# Patient Record
Sex: Female | Born: 1950 | Race: White | Hispanic: No | Marital: Married
Health system: Midwestern US, Community
[De-identification: ages and names within clinical notes are randomized; demographics above are authoritative.]

## PROBLEM LIST (undated history)

## (undated) DIAGNOSIS — M1612 Unilateral primary osteoarthritis, left hip: Secondary | ICD-10-CM

## (undated) DIAGNOSIS — M5416 Radiculopathy, lumbar region: Secondary | ICD-10-CM

## (undated) DIAGNOSIS — 1 ERRONEOUS ENCOUNTER ICD10: Secondary | ICD-10-CM

## (undated) DIAGNOSIS — E038 Other specified hypothyroidism: Secondary | ICD-10-CM

## (undated) DIAGNOSIS — M79604 Pain in right leg: Secondary | ICD-10-CM

## (undated) DIAGNOSIS — M545 Low back pain, unspecified: Secondary | ICD-10-CM

## (undated) DIAGNOSIS — Z1231 Encounter for screening mammogram for malignant neoplasm of breast: Secondary | ICD-10-CM

## (undated) DIAGNOSIS — M5117 Intervertebral disc disorders with radiculopathy, lumbosacral region: Secondary | ICD-10-CM

## (undated) DIAGNOSIS — R928 Other abnormal and inconclusive findings on diagnostic imaging of breast: Secondary | ICD-10-CM

## (undated) DIAGNOSIS — F17210 Nicotine dependence, cigarettes, uncomplicated: Secondary | ICD-10-CM

## (undated) DIAGNOSIS — M25571 Pain in right ankle and joints of right foot: Secondary | ICD-10-CM

## (undated) DIAGNOSIS — Z96642 Presence of left artificial hip joint: Secondary | ICD-10-CM

## (undated) DIAGNOSIS — Z0000 Encounter for general adult medical examination without abnormal findings: Principal | ICD-10-CM

## (undated) DIAGNOSIS — R7303 Prediabetes: Secondary | ICD-10-CM

## (undated) DIAGNOSIS — G8929 Other chronic pain: Secondary | ICD-10-CM

## (undated) DIAGNOSIS — M25561 Pain in right knee: Secondary | ICD-10-CM

## (undated) DIAGNOSIS — Z01818 Encounter for other preprocedural examination: Secondary | ICD-10-CM

## (undated) DIAGNOSIS — Z4789 Encounter for other orthopedic aftercare: Secondary | ICD-10-CM

## (undated) DIAGNOSIS — Z789 Other specified health status: Secondary | ICD-10-CM

## (undated) DIAGNOSIS — N393 Stress incontinence (female) (male): Secondary | ICD-10-CM

## (undated) DIAGNOSIS — E559 Vitamin D deficiency, unspecified: Secondary | ICD-10-CM

## (undated) DIAGNOSIS — I10 Essential (primary) hypertension: Secondary | ICD-10-CM

## (undated) DIAGNOSIS — F17200 Nicotine dependence, unspecified, uncomplicated: Secondary | ICD-10-CM

## (undated) DIAGNOSIS — S99101A Unspecified physeal fracture of right metatarsal, initial encounter for closed fracture: Secondary | ICD-10-CM

## (undated) DIAGNOSIS — M25559 Pain in unspecified hip: Secondary | ICD-10-CM

## (undated) DIAGNOSIS — E039 Hypothyroidism, unspecified: Secondary | ICD-10-CM

## (undated) DIAGNOSIS — S82831D Other fracture of upper and lower end of right fibula, subsequent encounter for closed fracture with routine healing: Secondary | ICD-10-CM

## (undated) DIAGNOSIS — S82831A Other fracture of upper and lower end of right fibula, initial encounter for closed fracture: Secondary | ICD-10-CM

## (undated) DIAGNOSIS — M167 Other unilateral secondary osteoarthritis of hip: Secondary | ICD-10-CM

## (undated) DIAGNOSIS — Z0189 Encounter for other specified special examinations: Secondary | ICD-10-CM

## (undated) DIAGNOSIS — M25552 Pain in left hip: Secondary | ICD-10-CM

## (undated) HISTORY — DX: Stress incontinence (female) (male): N39.3

## (undated) HISTORY — PX: TONSILLECTOMY: SUR1361

## (undated) HISTORY — DX: Other specified health status: Z78.9

## (undated) HISTORY — DX: Nicotine dependence, unspecified, uncomplicated: F17.200

## (undated) HISTORY — DX: Vitamin D deficiency, unspecified: E55.9

## (undated) HISTORY — PX: BACK SURGERY: SHX140

## (undated) HISTORY — PX: ABDOMINAL HYSTERECTOMY: SHX81

## (undated) HISTORY — PX: CHOLECYSTECTOMY: SHX55

---

## 2003-02-01 ENCOUNTER — Other Ambulatory Visit: Admission: RE | Admit: 2003-02-01 | Discharge: 2003-02-01 | Payer: Self-pay | Admitting: Obstetrics and Gynecology

## 2010-06-09 ENCOUNTER — Encounter: Admission: RE | Admit: 2010-06-09 | Discharge: 2010-06-09 | Payer: Self-pay | Admitting: Family Medicine

## 2011-08-13 ENCOUNTER — Other Ambulatory Visit: Payer: Self-pay

## 2011-08-13 ENCOUNTER — Encounter (HOSPITAL_COMMUNITY)
Admission: RE | Admit: 2011-08-13 | Discharge: 2011-08-13 | Disposition: A | Discharge status: Home / Self Care | Payer: PRIVATE HEALTH INSURANCE | Source: Ambulatory Visit | Attending: Obstetrics & Gynecology | Admitting: Obstetrics & Gynecology

## 2011-08-13 ENCOUNTER — Encounter (HOSPITAL_COMMUNITY): Payer: Self-pay

## 2011-08-13 HISTORY — DX: Essential (primary) hypertension: I10

## 2011-08-13 LAB — BASIC METABOLIC PANEL
Calcium: 9.2 mg/dL (ref 8.4–10.5)
Chloride: 101 mEq/L (ref 96–112)
Creatinine, Ser: 0.55 mg/dL (ref 0.50–1.10)
Glucose, Bld: 93 mg/dL (ref 70–99)
Potassium: 4.2 mEq/L (ref 3.5–5.1)

## 2011-08-13 LAB — CBC
HCT: 45.1 % (ref 36.0–46.0)
Hemoglobin: 15.6 g/dL — ABNORMAL HIGH (ref 12.0–15.0)
MCH: 32.5 pg (ref 26.0–34.0)
MCV: 94 fL (ref 78.0–100.0)
Platelets: 288 10*3/uL (ref 150–400)
RBC: 4.8 MIL/uL (ref 3.87–5.11)
WBC: 7.6 10*3/uL (ref 4.0–10.5)

## 2011-08-13 LAB — SURGICAL PCR SCREEN: MRSA, PCR: NEGATIVE

## 2011-08-13 NOTE — Anesthesia Preprocedure Evaluation (Addendum)
Anesthesia Evaluation  Name, MR# and DOB Patient awake  General Assessment Comment  Reviewed: Allergy & Precautions, H&P , Patient's Chart, lab work & pertinent test results, reviewed documented beta blocker date and time , Unable to perform ROS - Chart review only  Airway Mallampati: I TM Distance: >3 FB Neck ROM: full    Dental  (+) Teeth Intact and Dental Advisory Given   Pulmonary  clear to auscultation  pulmonary exam normalPulmonary Exam Normal breath sounds clear to auscultation none    Cardiovascular regular Bradycardia    Neuro/Psych Negative Neurological ROS  Negative Psych ROS  GI/Hepatic/Renal negative GI ROS, negative Liver ROS, and negative Renal ROS (+)       Endo/Other  Negative Endocrine ROS (+)      Abdominal Normal abdominal exam  (+)   Musculoskeletal   Hematology negative hematology ROS (+)   Peds  Reproductive/Obstetrics negative OB ROS    Anesthesia Other Findings           Anesthesia Physical Anesthesia Plan  ASA: II  Anesthesia Plan: General   Post-op Pain Management:    Induction: Intravenous  Airway Management Planned: LMA  Additional Equipment:   Intra-op Plan:   Post-operative Plan:   Informed Consent: I have reviewed the patients History and Physical, chart, labs and discussed the procedure including the risks, benefits and alternatives for the proposed anesthesia with the patient or authorized representative who has indicated his/her understanding and acceptance.   Dental Advisory Given and History available from chart only  Plan Discussed with: CRNA  Anesthesia Plan Comments: (Patient does not want Versed)      Anesthesia Quick Evaluation

## 2011-08-13 NOTE — Patient Instructions (Signed)
20 Andrea Floyd  08/13/2011   Your procedure is scheduled on:  08/20/11  Report to [facility] at 0600 AM.  Call this number if you have problems the morning of surgery: [phone]   Remember:   Do not eat food:After Midnight.  Do not drink clear liquids: After Midnight.  Take these medicines the morning of surgery with A SIP OF WATER: none   Do not wear jewelry, make-up or nail polish.  Do not wear lotions, powders, or perfumes. You may wear deodorant.  Do not shave 48 hours prior to surgery.  Do not bring valuables to the hospital.  Contacts, dentures or bridgework may not be worn into surgery.  Leave suitcase in the car. After surgery it may be brought to your room.  For patients admitted to the hospital, checkout time is 11:00 AM the day of discharge.   Patients discharged the day of surgery will not be allowed to drive home.  Name and phone number of your driver: [name]  Special Instructions: CHG Shower Use Special Wash: 1/2 bottle night before surgery and 1/2 bottle morning of surgery.   Please read over the following fact sheets that you were given: Care and Recovery After Surgery

## 2011-08-20 ENCOUNTER — Ambulatory Visit (HOSPITAL_COMMUNITY): Payer: PRIVATE HEALTH INSURANCE | Admitting: Anesthesiology

## 2011-08-20 ENCOUNTER — Other Ambulatory Visit: Payer: Self-pay | Admitting: Obstetrics & Gynecology

## 2011-08-20 ENCOUNTER — Inpatient Hospital Stay (HOSPITAL_COMMUNITY)
Admission: RE | Admit: 2011-08-20 | Discharge: 2011-08-21 | DRG: 743 | Disposition: A | Discharge status: Home / Self Care | Payer: PRIVATE HEALTH INSURANCE | Source: Ambulatory Visit | Attending: Obstetrics & Gynecology | Admitting: Obstetrics & Gynecology

## 2011-08-20 ENCOUNTER — Encounter (HOSPITAL_COMMUNITY): Payer: Self-pay | Admitting: Anesthesiology

## 2011-08-20 ENCOUNTER — Encounter (HOSPITAL_COMMUNITY)
Admission: RE | Disposition: A | Discharge status: Home / Self Care | Payer: Self-pay | Source: Ambulatory Visit | Attending: Obstetrics & Gynecology

## 2011-08-20 ENCOUNTER — Encounter (HOSPITAL_COMMUNITY): Payer: Self-pay | Admitting: Obstetrics & Gynecology

## 2011-08-20 DIAGNOSIS — E039 Hypothyroidism, unspecified: Secondary | ICD-10-CM | POA: Diagnosis present

## 2011-08-20 DIAGNOSIS — N8000 Endometriosis of the uterus, unspecified: Secondary | ICD-10-CM | POA: Diagnosis present

## 2011-08-20 DIAGNOSIS — N814 Uterovaginal prolapse, unspecified: Secondary | ICD-10-CM

## 2011-08-20 DIAGNOSIS — N393 Stress incontinence (female) (male): Secondary | ICD-10-CM | POA: Diagnosis present

## 2011-08-20 DIAGNOSIS — I10 Essential (primary) hypertension: Secondary | ICD-10-CM | POA: Diagnosis present

## 2011-08-20 DIAGNOSIS — N812 Incomplete uterovaginal prolapse: Principal | ICD-10-CM | POA: Diagnosis present

## 2011-08-20 HISTORY — PX: ANTERIOR AND POSTERIOR REPAIR: SHX5121

## 2011-08-20 HISTORY — DX: Stress incontinence (female) (male): N39.3

## 2011-08-20 HISTORY — PX: VAGINAL HYSTERECTOMY: SHX2639

## 2011-08-20 HISTORY — PX: BLADDER SUSPENSION: SHX72

## 2011-08-20 LAB — TYPE AND SCREEN: Antibody Screen: NEGATIVE

## 2011-08-20 LAB — ABO/RH: ABO/RH(D): A POS

## 2011-08-20 SURGERY — HYSTERECTOMY, VAGINAL
Anesthesia: General | Site: Vagina | Wound class: Clean Contaminated

## 2011-08-20 MED ORDER — ESTRADIOL 0.1 MG/GM VA CREA
TOPICAL_CREAM | VAGINAL | Status: DC | PRN
  Administered 2011-08-20: 1 via VAGINAL

## 2011-08-20 MED ORDER — HYDROMORPHONE HCL 1 MG/ML IJ SOLN: 0.2500 mg | INTRAMUSCULAR | Status: DC | PRN

## 2011-08-20 MED ORDER — ONDANSETRON HCL 4 MG PO TABS: 4.0000 mg | ORAL_TABLET | Freq: Four times a day (QID) | ORAL | Status: DC | PRN

## 2011-08-20 MED ORDER — HYDROMORPHONE HCL 1 MG/ML IJ SOLN
INTRAMUSCULAR | Status: AC
  Filled 2011-08-20: qty 1

## 2011-08-20 MED ORDER — SODIUM CHLORIDE 0.9 % IJ SOLN
INTRAMUSCULAR | Status: DC | PRN
  Administered 2011-08-20: 30 mL via INTRAVENOUS

## 2011-08-20 MED ORDER — VASOPRESSIN 20 UNIT/ML IJ SOLN
INTRAMUSCULAR | Status: DC | PRN
  Administered 2011-08-20: 20 [IU] via SUBCUTANEOUS

## 2011-08-20 MED ORDER — INDIGOTINDISULFONATE SODIUM 8 MG/ML IJ SOLN
INTRAMUSCULAR | Status: AC
  Filled 2011-08-20: qty 5

## 2011-08-20 MED ORDER — MIDAZOLAM HCL 2 MG/2ML IJ SOLN
INTRAMUSCULAR | Status: AC
  Filled 2011-08-20: qty 2

## 2011-08-20 MED ORDER — MENTHOL 3 MG MT LOZG: 1.0000 | LOZENGE | OROMUCOSAL | Status: DC | PRN

## 2011-08-20 MED ORDER — ROCURONIUM BROMIDE 50 MG/5ML IV SOLN
INTRAVENOUS | Status: AC
  Filled 2011-08-20: qty 1

## 2011-08-20 MED ORDER — HYDROMORPHONE HCL 2 MG PO TABS
2.0000 mg | ORAL_TABLET | Freq: Four times a day (QID) | ORAL | Status: DC | PRN
  Administered 2011-08-20 – 2011-08-21 (×4): 2 mg via ORAL
  Filled 2011-08-20 (×4): qty 1

## 2011-08-20 MED ORDER — LIDOCAINE HCL (CARDIAC) 20 MG/ML IV SOLN
INTRAVENOUS | Status: DC | PRN
  Administered 2011-08-20: 60 mg via INTRAVENOUS

## 2011-08-20 MED ORDER — NEOSTIGMINE METHYLSULFATE 1 MG/ML IJ SOLN
INTRAMUSCULAR | Status: AC
  Filled 2011-08-20: qty 10

## 2011-08-20 MED ORDER — LIDOCAINE HCL (CARDIAC) 20 MG/ML IV SOLN
INTRAVENOUS | Status: AC
  Filled 2011-08-20: qty 5

## 2011-08-20 MED ORDER — AMLODIPINE BESYLATE 10 MG PO TABS
10.0000 mg | ORAL_TABLET | Freq: Every day | ORAL | Status: DC
  Administered 2011-08-20: 10 mg via ORAL
  Filled 2011-08-20 (×2): qty 1

## 2011-08-20 MED ORDER — ONDANSETRON HCL 4 MG/2ML IJ SOLN
INTRAMUSCULAR | Status: DC | PRN
  Administered 2011-08-20: 4 mg via INTRAVENOUS

## 2011-08-20 MED ORDER — DEXAMETHASONE SODIUM PHOSPHATE 4 MG/ML IJ SOLN
INTRAMUSCULAR | Status: DC | PRN
  Administered 2011-08-20: 8 mg via INTRAVENOUS

## 2011-08-20 MED ORDER — NEOSTIGMINE METHYLSULFATE 1 MG/ML IJ SOLN
INTRAMUSCULAR | Status: DC | PRN
  Administered 2011-08-20: 3 mg via INTRAMUSCULAR

## 2011-08-20 MED ORDER — INDIGOTINDISULFONATE SODIUM 8 MG/ML IJ SOLN
INTRAMUSCULAR | Status: DC | PRN
  Administered 2011-08-20: 5 mL via INTRAVENOUS

## 2011-08-20 MED ORDER — PROPOFOL 10 MG/ML IV EMUL
INTRAVENOUS | Status: AC
  Filled 2011-08-20: qty 20

## 2011-08-20 MED ORDER — PROMETHAZINE HCL 25 MG/ML IJ SOLN
INTRAMUSCULAR | Status: AC
  Administered 2011-08-20: 6.25 mg via INTRAVENOUS
  Filled 2011-08-20: qty 1

## 2011-08-20 MED ORDER — FENTANYL CITRATE 0.05 MG/ML IJ SOLN
INTRAMUSCULAR | Status: DC | PRN
  Administered 2011-08-20: 50 ug via INTRAVENOUS
  Administered 2011-08-20 (×3): 100 ug via INTRAVENOUS

## 2011-08-20 MED ORDER — MIDAZOLAM HCL 5 MG/5ML IJ SOLN
INTRAMUSCULAR | Status: DC | PRN
  Administered 2011-08-20: 1 mg via INTRAVENOUS

## 2011-08-20 MED ORDER — BUPIVACAINE-EPINEPHRINE 0.25% -1:200000 IJ SOLN
INTRAMUSCULAR | Status: DC | PRN
  Administered 2011-08-20: 55 mL

## 2011-08-20 MED ORDER — ALUM & MAG HYDROXIDE-SIMETH 200-200-20 MG/5ML PO SUSP: 30.0000 mL | ORAL | Status: DC | PRN

## 2011-08-20 MED ORDER — DEXAMETHASONE SODIUM PHOSPHATE 10 MG/ML IJ SOLN
INTRAMUSCULAR | Status: AC
  Filled 2011-08-20: qty 1

## 2011-08-20 MED ORDER — CEFAZOLIN SODIUM 1-5 GM-% IV SOLN
INTRAVENOUS | Status: AC
  Filled 2011-08-20: qty 50

## 2011-08-20 MED ORDER — PROMETHAZINE HCL 25 MG/ML IJ SOLN
12.5000 mg | Freq: Four times a day (QID) | INTRAMUSCULAR | Status: DC | PRN
  Administered 2011-08-20: 6.25 mg via INTRAVENOUS

## 2011-08-20 MED ORDER — AMLODIPINE BESYLATE 10 MG PO TABS
10.0000 mg | ORAL_TABLET | Freq: Once | ORAL | Status: DC
  Filled 2011-08-20: qty 1

## 2011-08-20 MED ORDER — SCOPOLAMINE 1 MG/3DAYS TD PT72
MEDICATED_PATCH | TRANSDERMAL | Status: AC
  Administered 2011-08-20: 1.5 mg via TRANSDERMAL
  Filled 2011-08-20: qty 1

## 2011-08-20 MED ORDER — CEFAZOLIN SODIUM 1-5 GM-% IV SOLN
1.0000 g | INTRAVENOUS | Status: AC
  Administered 2011-08-20: 1 g via INTRAVENOUS

## 2011-08-20 MED ORDER — PROPOFOL 10 MG/ML IV EMUL
INTRAVENOUS | Status: DC | PRN
  Administered 2011-08-20: 50 mg via INTRAVENOUS
  Administered 2011-08-20: 150 mg via INTRAVENOUS
  Administered 2011-08-20: 50 mg via INTRAVENOUS

## 2011-08-20 MED ORDER — ROCURONIUM BROMIDE 100 MG/10ML IV SOLN
INTRAVENOUS | Status: DC | PRN
  Administered 2011-08-20: 30 mg via INTRAVENOUS

## 2011-08-20 MED ORDER — LACTATED RINGERS IV SOLN
INTRAVENOUS | Status: DC
  Administered 2011-08-20: 07:00:00 via INTRAVENOUS
  Administered 2011-08-20: 1000 mL via INTRAVENOUS

## 2011-08-20 MED ORDER — VASOPRESSIN 20 UNIT/ML IJ SOLN
INTRAVENOUS | Status: DC | PRN
  Administered 2011-08-20: 09:00:00 via INTRAMUSCULAR

## 2011-08-20 MED ORDER — AMLODIPINE BESYLATE 10 MG PO TABS
10.0000 mg | ORAL_TABLET | Freq: Every day | ORAL | Status: DC
  Filled 2011-08-20 (×2): qty 1

## 2011-08-20 MED ORDER — HYDROMORPHONE HCL 1 MG/ML IJ SOLN
INTRAMUSCULAR | Status: DC | PRN
  Administered 2011-08-20 (×2): 0.5 mg via INTRAVENOUS

## 2011-08-20 MED ORDER — GLYCOPYRROLATE 0.2 MG/ML IJ SOLN
INTRAMUSCULAR | Status: DC | PRN
  Administered 2011-08-20: .4 mg via INTRAVENOUS

## 2011-08-20 MED ORDER — ONDANSETRON HCL 4 MG/2ML IJ SOLN: 4.0000 mg | Freq: Four times a day (QID) | INTRAMUSCULAR | Status: DC | PRN

## 2011-08-20 MED ORDER — SCOPOLAMINE 1 MG/3DAYS TD PT72
1.0000 | MEDICATED_PATCH | TRANSDERMAL | Status: DC
  Administered 2011-08-20: 1.5 mg via TRANSDERMAL

## 2011-08-20 MED ORDER — FENTANYL CITRATE 0.05 MG/ML IJ SOLN
INTRAMUSCULAR | Status: AC
  Filled 2011-08-20: qty 2

## 2011-08-20 MED ORDER — KETOROLAC TROMETHAMINE 30 MG/ML IJ SOLN
INTRAMUSCULAR | Status: AC
  Filled 2011-08-20: qty 1

## 2011-08-20 MED ORDER — STERILE WATER FOR IRRIGATION IR SOLN
Status: DC | PRN
  Administered 2011-08-20: 500 mL

## 2011-08-20 MED ORDER — LACTATED RINGERS IV SOLN
INTRAVENOUS | Status: DC | PRN
  Administered 2011-08-20 (×3): via INTRAVENOUS

## 2011-08-20 MED ORDER — EPHEDRINE 5 MG/ML INJ
INTRAVENOUS | Status: AC
  Filled 2011-08-20: qty 10

## 2011-08-20 MED ORDER — KETOROLAC TROMETHAMINE 30 MG/ML IJ SOLN
INTRAMUSCULAR | Status: DC | PRN
  Administered 2011-08-20: 30 mg via INTRAVENOUS

## 2011-08-20 MED ORDER — ONDANSETRON HCL 4 MG/2ML IJ SOLN
INTRAMUSCULAR | Status: AC
  Filled 2011-08-20: qty 2

## 2011-08-20 MED ORDER — FENTANYL CITRATE 0.05 MG/ML IJ SOLN
INTRAMUSCULAR | Status: AC
  Filled 2011-08-20: qty 5

## 2011-08-20 MED ORDER — LACTATED RINGERS IV SOLN
INTRAVENOUS | Status: DC
  Administered 2011-08-20 – 2011-08-21 (×2): via INTRAVENOUS

## 2011-08-20 MED ORDER — EPHEDRINE SULFATE 50 MG/ML IJ SOLN
INTRAMUSCULAR | Status: DC | PRN
  Administered 2011-08-20: 10 mg via INTRAVENOUS

## 2011-08-20 MED ORDER — GLYCOPYRROLATE 0.2 MG/ML IJ SOLN
INTRAMUSCULAR | Status: AC
  Filled 2011-08-20: qty 2

## 2011-08-20 SURGICAL SUPPLY — 53 items
BLADE SURG 15 STRL LF C SS BP (BLADE) ×4 IMPLANT
BLADE SURG 15 STRL SS (BLADE) ×6
CANISTER SUCTION 2500CC (MISCELLANEOUS) ×3 IMPLANT
CLOTH BEACON ORANGE TIMEOUT ST (SAFETY) ×3 IMPLANT
CONT PATH 16OZ SNAP LID 3702 (MISCELLANEOUS) ×1 IMPLANT
CONTAINER PREFILL 10% NBF 60ML (FORM) IMPLANT
DECANTER SPIKE VIAL GLASS SM (MISCELLANEOUS) ×1 IMPLANT
DERMABOND ADVANCED (GAUZE/BANDAGES/DRESSINGS) ×3 IMPLANT
DRAPE CAMERA CLOSED 9X96 (DRAPES) ×3 IMPLANT
DRAPE HYSTEROSCOPY (DRAPE) ×3 IMPLANT
DRAPE STERI URO 9X17 APER PCH (DRAPES) ×3 IMPLANT
DRAPE UTILITY XL STRL (DRAPES) ×3 IMPLANT
ELECT LIGASURE LONG (ELECTRODE) IMPLANT
ELECT LIGASURE SHORT 9 REUSE (ELECTRODE) ×1 IMPLANT
GAUZE PACKING 1 X5 YD ST (GAUZE/BANDAGES/DRESSINGS) ×1 IMPLANT
GAUZE SPONGE 4X4 16PLY XRAY LF (GAUZE/BANDAGES/DRESSINGS) ×2 IMPLANT
GLOVE BIO SURGEON STRL SZ7 (GLOVE) ×3 IMPLANT
GLOVE BIOGEL PI IND STRL 7.0 (GLOVE) ×4 IMPLANT
GLOVE BIOGEL PI INDICATOR 7.0 (GLOVE) ×2
GOWN PREVENTION PLUS LG XLONG (DISPOSABLE) ×12 IMPLANT
NDL SPNL 18GX3.5 QUINCKE PK (NEEDLE) IMPLANT
NDL SPNL 22GX3.5 QUINCKE BK (NEEDLE) IMPLANT
NEEDLE HYPO 22GX1.5 SAFETY (NEEDLE) ×3 IMPLANT
NEEDLE MAYO .5 CIRCLE (NEEDLE) ×1 IMPLANT
NEEDLE SPNL 18GX3.5 QUINCKE PK (NEEDLE) ×3 IMPLANT
NEEDLE SPNL 22GX3.5 QUINCKE BK (NEEDLE) IMPLANT
NS IRRIG 1000ML POUR BTL (IV SOLUTION) ×3 IMPLANT
PACK VAGINAL WOMENS (CUSTOM PROCEDURE TRAY) ×3 IMPLANT
RETRACTOR STAY HOOK 5MM (MISCELLANEOUS) IMPLANT
SET CYSTO W/LG BORE CLAMP LF (SET/KITS/TRAYS/PACK) ×3 IMPLANT
SLING TVT EXACT (Sling) ×1 IMPLANT
SUT CHROMIC 0 CT 1 (SUTURE) IMPLANT
SUT CHROMIC 2 0 SH (SUTURE) ×7 IMPLANT
SUT CHROMIC 3 0 SH 27 (SUTURE) ×3 IMPLANT
SUT VIC AB 0 CT1 18XCR BRD8 (SUTURE) ×6 IMPLANT
SUT VIC AB 0 CT1 27 (SUTURE)
SUT VIC AB 0 CT1 27XBRD ANBCTR (SUTURE) IMPLANT
SUT VIC AB 0 CT1 8-18 (SUTURE) ×9
SUT VIC AB 2-0 SH 27 (SUTURE) ×27
SUT VIC AB 2-0 SH 27XBRD (SUTURE) IMPLANT
SUT VIC AB 3-0 CT1 27 (SUTURE) ×3
SUT VIC AB 3-0 CT1 TAPERPNT 27 (SUTURE) ×2 IMPLANT
SUT VIC AB 3-0 SH 27 (SUTURE) ×6
SUT VIC AB 3-0 SH 27X BRD (SUTURE) IMPLANT
SUT VICRYL 0 TIES 12 18 (SUTURE) ×1 IMPLANT
SYR 30ML LL (SYRINGE) ×1 IMPLANT
SYR TB 1ML 27GX1/2 SAFE (SYRINGE) IMPLANT
SYR TB 1ML 27GX1/2 SAFETY (SYRINGE) ×3
TOWEL OR 17X24 6PK STRL BLUE (TOWEL DISPOSABLE) ×6 IMPLANT
TRAY FOLEY BAG SILVER LF 16FR (CATHETERS) ×3 IMPLANT
TRAY FOLEY CATH 14FR (SET/KITS/TRAYS/PACK) ×2 IMPLANT
TUBING SUCTION BULK 100 FT (MISCELLANEOUS) ×1 IMPLANT
WATER STERILE IRR 1000ML POUR (IV SOLUTION) ×3 IMPLANT

## 2011-08-20 NOTE — H&P (Signed)
Andrea Floyd is an 60 y.o. female.Has uterovaginal prolapse and CMG diagnosed occult stress urinary incontinence when tested with pessary in place. Here for Total vaginal hysterectomy, AP repair and TVT sling, cystoscopy.  HTN (Norvasc), Hypothryroid (no meds), long term smoker, atrophic vaginitis, with no HRT use in menopause.  No abn bleeding, No abn paps. No abn mammograms, no breast complaints.  Allergy - Demerol  No LMP recorded. Patient is postmenopausal.    Past Medical History  Diagnosis Date  . Hypertension   . Hypothyroidism     h/o yrs ago- no meds now    Past Surgical History  Procedure Date  . Cholecystectomy   . Back surgery     History reviewed. No pertinent family history.  Social History:  reports that she has been smoking Cigarettes.  She has been smoking about .25 packs per day. She does not have any smokeless tobacco history on file. She reports that she drinks alcohol. She reports that she does not use illicit drugs.  Allergies:  Allergies  Allergen Reactions  . Codeine Shortness Of Breath  . Demerol Nausea And Vomiting    Prescriptions prior to admission  Medication Sig Dispense Refill  . amLODipine (NORVASC) 10 MG tablet Take 10 mg by mouth daily.        Marland Kitchen aspirin 325 MG tablet Take 325 mg by mouth daily.        . Cholecalciferol 1000 UNITS CHEW Chew 1 tablet by mouth daily.        . Glucosamine 500 MG CAPS Take 1 each by mouth daily.        . Omega-3 Fatty Acids (FISH OIL MAXIMUM STRENGTH) 1200 MG CAPS Take 1 capsule by mouth daily.        . vitamin C (ASCORBIC ACID) 500 MG tablet Take 500 mg by mouth daily.         ROS:  Denies SOB/chest pain/ HA/ vision changes/ LE pain or swelling/ etc.  There were no vitals taken for this visit. Physical exam:  A&O x 3, no acute distress. Pleasant HEENT neg, no thyromegaly Lungs CTA bilat CV RRR, A1S2 normal Abdo soft, non tender, non acute Extr no edema/ tenderness Pelvic Stage 3 uterovag.prolapse,  cystocele, rectocele. Office CMG SUI, nl pressure urethra, nl capacity and compliance.  No results found for this or any previous visit (from the past 24 hour(s)).  No results found.  Assessment/Plan: Total vag hysterectomy, AP repair, TVT, cystoscopy.  [name] R 08/20/2011, 6:47 AM

## 2011-08-20 NOTE — Anesthesia Postprocedure Evaluation (Signed)
Anesthesia Post Note  Patient: Andrea Floyd  Procedure(s) Performed:  HYSTERECTOMY VAGINAL - With Removal Of Pessary.; ANTERIOR (CYSTOCELE) AND POSTERIOR REPAIR (RECTOCELE) - With Cystoscopy; TRANSVAGINAL TAPE (TVT) PROCEDURE  Anesthesia type: General  Patient location: PACU  Post pain: Pain level controlled  Post assessment: Post-op Vital signs reviewed  Last Vitals:  Filed Vitals:   08/20/11 1245  BP: 127/74  Pulse: 65  Temp:   Resp: 13    Post vital signs: Reviewed  Level of consciousness: sedated  Complications: No apparent anesthesia complications

## 2011-08-20 NOTE — OR Nursing (Signed)
Assisting surgeon out @ [phone] to nursery to do circumcision. Returned to room @ 1003.

## 2011-08-20 NOTE — OR Nursing (Signed)
Attempted pt. Status update to family @ 30 by Arelia Longest RN. Family not in surgical waiting area. Dr. Juliene Pina aware.

## 2011-08-20 NOTE — OR Nursing (Signed)
TVT Exact sling implanted @ midurethral position in OR suite per Dr. Juliene Pina. Exp. 04/27/12. Serial # [id number].

## 2011-08-20 NOTE — Op Note (Signed)
Preoperative diagnosis: Uterovaginal prolapse, stage III, rectocele, cystocele, stress urinary incontinence.  Postoperative diagnosis: same Procedure: Removal of pessary, the vaginal hysterectomy, anterior colporrhaphy and posterior colpoperineorrhaphy, TVT midureteral sling, cystoscopy. Surgeon: Shea Evans, MD Assistant: Olivia Mackie, MD Anesthesia General endotracheal  IV fluids 2800 cc LR EBL 200 cc Urine output 300 cc Condition stable Disposition PACU and to the floor Specimen uterus and cervix Complications: None  Procedure: Patient is 60 year old woman with stage III uterovaginal prolapse, cystocele and rectocele, who had tried a pessary for a few years, now desired to have surgical treatment of prolapse including hysterectomy and AP repair. Pre-op CMG noted stress urinary incontinence when prolapse was reduced. Risks and complications of surgery including infection bleeding damage to internal organs, injury to bladder or rectum delay complications including dyspareunia, and recurrence of prolapse were reviewed. Risk of urinary retention, prolonged catheterization and other surgery related complications including pneumonia, the VTE, etc reviewed. Informed consent was obtained.  Patient brought to the operating room at IV running. She received Ancef 1 gm. She underwent general endotracheal anesthesia, was given dorsolithotomy position. Bladder was emptied with indwelling Foley.  Cervix was grasped with tenaculum x 2. Dilute Pitressin was injected circumferentially cervicovaginal junction and a circular incision was made at cervicovaginal junction and carried down to the underlying endopelvic fascia. Dissection vaginal wall done so that the bladder anteriorly and rectum posteriorly were dissected away from the cervix. Posteriorly tissue over cul dec sac area was grasped with Allis, and cul-de-sac was entered with sharp dissection. Finger was passed over the uterus anteriorly and anterior  culdotomy was performed over finger by blunt and sharp dissection. Bilateral uterosacral ligaments were clamped with Heaney clamps, pedicles were incised and suture transfixed. With LigaSure desiccator and parametrial broad ligament tissue was desiccated in stepwise fashion and incised. Corna were reached bilaterally where Heaney clamps were applied at the cornua bilaterally, pedicle excised, uterus was removed and given to pathology. The pedicles were first tied with a free-tie of 0 Vicryl followed by suture transfixation. Hemostasis was excellent. Right uterosacral suture was accidentally cut with LigaSure and was redressed and suture transfixed. All pedicles were hemostatic. Anterior colporrhaphy was begun at this point. Dissection area on anterior vaginal wall was demarcated with 3 Allis clamps, dilute pitressin was injected to creat planes. A vertical incision was made up to 3 cm below the urethral meatus. With blunt and sharp dissection vaginal wall was dissected from underlying endopelvic fascia and bladder. Excess vaginal tissue was excised. Endopelvic fascia was approximated in the midline and cystocele was reduced using 2-0 Vicryl.   TVT-Exact ,idurethral sling was performed at this point. Through the upper end of cystocele dissection and midureteral area was palpated. Marcaine (1/4%) with epinephrine was injected towards dissection site angling to ipsilateral shoulder. TVT exact tape was mounted on the inserter. Catheter guide was introduced. Catheter guide was moved to patient's right and right sided placement of TVT needle was performed in stepwise fashion exiting suprapubically 2.5 cm to the right of midline through a 3 mm incision and was secured with Kelly clamp. Introducer was removed and left plastic sheath with tape was attached. In similar fashion left TVT needed was passed through the dissection area on the left side and brought out about 2.5 cm to the left of the midline and pubic symphysis  while catheter guide was moved to pt's left. . Plastic sheaths were kept in place with kelly clamp.. Catheter guide and catheter were removed. Cystoscopy was performed. No evidence of  foreign body noted in the bladder while bladder was filled to 400 cc,  no bleeding noted in the bladder. Ureters did not initially show squirts of urine, so IV indigo carmine given, cystoscopy repeated in a few minutes and bilateral urine flow from both ureteral openings. The cystoscope was removed bladder was drained. Tape was pulled skin incisions while leaving a Mayo scissors between TVT tape and the mid urethra for tension free placement. Excess tape at skin was excised. Vaginal portion of tape was secured in place at midureteral area with a stitch of 3-0 Vicryl. TVT exact exit sites were closed with Dermabond at the end.    Vaginal edges were approximated with 2-0 vicryl, to have vertical closure for better vaginal length. Uterosacral sutures with thread through bear needle to suspend vaginal angles.   Next posterior colpoperineorrhaphy was performed. Posterior fourchette/ perineal skin was excised with knife, the 2 Allis clamps applied at the edges and one was applied in the midline over the rectocele area, dilute pitressin was injected and dissection was performed and a vertical incision was made in the vagina. Vaginal cut edges were grasped and vaginal walls were dissected from underlying rectovaginal septum by blunt and sharp dissection until peritoneal reflexion was seen, while keeping one finger in the rectum. Gloves changed. Purse string suture of 3-0 vicryl taken at the apex to reduce high rectocele. Excess vaginal walls trimmed. Perineal body evaluated and approximated with 0-vicryl. Cut edges of vagina were sutured with 2-0 vicryl. Perineorrhaphy completed after approximating vagina introitus, approximating subcutaenous tissue and the skin with 2-0 vicryl. 2 fingers could be easily passed through vaginal opening with  excellent vaginal length noted.  Estrace cream applied thoroughly on vaginal walls and vaginal packing done. Foley connected to bag to gravity. EBL 200 cc, no complications, pt to be admitted to floor for overnight observation. All counts correct x 2.

## 2011-08-20 NOTE — Transfer of Care (Signed)
  Anesthesia Post-op Note  Patient: Karmen Stabs  Procedure(s) Performed:  HYSTERECTOMY VAGINAL - With Removal Of Pessary.; ANTERIOR (CYSTOCELE) AND POSTERIOR REPAIR (RECTOCELE) - With Cystoscopy; TRANSVAGINAL TAPE (TVT) PROCEDURE  Patient Location: PACU  Anesthesia Type: General  Level of Consciousness: awake, alert  and oriented  Airway and Oxygen Therapy: Patient Spontanous Breathing and Patient connected to nasal cannula oxygen  Post-op Pain: none  Post-op Assessment: Post-op Vital signs reviewed and Patient's Cardiovascular Status Stable  Post-op Vital Signs: Reviewed and stable  Complications: No apparent anesthesia complications

## 2011-08-20 NOTE — Anesthesia Procedure Notes (Signed)
Procedure Name: Intubation Date/Time: 08/20/2011 7:49 AM Performed by: Madison Hickman Pre-anesthesia Checklist: Patient identified, Emergency Drugs available, Suction available, Patient being monitored and Timeout performed Patient Re-evaluated:Patient Re-evaluated prior to inductionOxygen Delivery Method: Circle System Utilized Preoxygenation: Pre-oxygenation with 100% oxygen Intubation Type: IV induction Ventilation: Mask ventilation without difficulty Laryngoscope Size: Mac and 3 Grade View: Grade II Tube type: Oral Tube size: 7.0 mm Number of attempts: 1 Airway Equipment and Method: stylet Placement Confirmation: ETT inserted through vocal cords under direct vision,  positive ETCO2 and breath sounds checked- equal and bilateral Secured at: 22 cm Tube secured with: Tape Dental Injury: Teeth and Oropharynx as per pre-operative assessment

## 2011-08-21 LAB — BASIC METABOLIC PANEL
CO2: 29 mEq/L (ref 19–32)
Chloride: 103 mEq/L (ref 96–112)
Potassium: 3.7 mEq/L (ref 3.5–5.1)

## 2011-08-21 LAB — CBC
HCT: 38.9 % (ref 36.0–46.0)
Platelets: 256 10*3/uL (ref 150–400)
RBC: 4.15 MIL/uL (ref 3.87–5.11)
RDW: 12.9 % (ref 11.5–15.5)
WBC: 15.3 10*3/uL — ABNORMAL HIGH (ref 4.0–10.5)

## 2011-08-21 MED ORDER — HYDROMORPHONE HCL 2 MG PO TABS: 2.0000 mg | ORAL_TABLET | Freq: Four times a day (QID) | ORAL | Status: AC | PRN

## 2011-08-21 NOTE — Anesthesia Postprocedure Evaluation (Signed)
  Anesthesia Post-op Note  Patient: Andrea Floyd  Procedure(s) Performed:  HYSTERECTOMY VAGINAL - With Removal Of Pessary.; ANTERIOR (CYSTOCELE) AND POSTERIOR REPAIR (RECTOCELE) - With Cystoscopy; TRANSVAGINAL TAPE (TVT) PROCEDURE  Patient Location: PACU and Women's Unit  Anesthesia Type: General  Level of Consciousness: awake, alert  and oriented  Airway and Oxygen Therapy: Patient Spontanous Breathing  Post-op Pain: none  Post-op Assessment: Post-op Vital signs reviewed and Patient's Cardiovascular Status Stable  Post-op Vital Signs: Reviewed and stable  Complications: No apparent anesthesia complications

## 2011-08-21 NOTE — Progress Notes (Signed)
UR Chart review completed.  

## 2011-08-21 NOTE — Progress Notes (Signed)
Andrea Floyd is a 60 y.o. female patient. S/p TVH/ AP repair, TVT sling, cystoscopy.  Foley Dc'ed this AM, saw pt at 8.30 am for rounds and was sitting up doing very well with little vag.blding since vag.packing was removed. No dizziness/ CP/SOB. No nausea/vomiting.  No diagnosis found. Past Medical History  Diagnosis Date  . Hypertension   . Hypothyroidism     h/o yrs ago- no meds now   No past surgical history pertinent negatives on file. Scheduled Meds:   . amLODipine  10 mg Oral QHS   Continuous Infusions:   . lactated ringers 1,000 mL (08/20/11 1149)  . lactated ringers 100 mL/hr at 08/21/11 0155   PRN Meds:alum & mag hydroxide-simeth, HYDROmorphone, menthol-cetylpyridinium, ondansetron (ZOFRAN) IV, ondansetron, DISCONTD: HYDROmorphone, DISCONTD: promethazine  Allergies  Allergen Reactions  . Codeine Shortness Of Breath  . Demerol Nausea And Vomiting   Active Problems:  Uterovaginal prolapse  SUI (stress urinary incontinence, female)  Blood pressure 136/76, pulse 76, temperature 98.8 F (37.1 C), temperature source Oral, resp. rate 18, height 5\' 2"  (1.575 m), weight 66.679 kg (147 lb), SpO2 97.00%.  Subjective Doing well, pain well controlled with PO Dilaudid.  Objective Lungs CTA b/l, CV RRR, Abdo soft, NT, ND, NABS. Pelvic deferred, pad with minimal spotting.  Assessment & Plan POD 1. Surgical findings and post op care reviewed.  Pt passed voiding trial (RN reported at 1.15 pm), will allow D/c home without foley. Frequent voiding and post op care.  Lonald Troiani R 08/21/2011

## 2011-08-21 NOTE — Progress Notes (Signed)
Encounter addended by: Madison Hickman on: 08/21/2011  8:21 AM<BR>     Documentation filed: Notes Section

## 2011-08-21 NOTE — Discharge Summary (Signed)
Physician Discharge Summary  Patient ID: SUKI CROCKETT MRN: [id number] DOB/AGE: Aug 29, 1951 60 y.o.  Admit date: 08/20/2011 Discharge date: 08/21/2011  Admission Diagnoses: Uterovaginal prolapse, urinary incontinence  Discharge Diagnoses: Same, Total vaginal hysterectomy, AP repair, TVT midurethral sling and cystoscopy  Discharged Condition: Stable, improved.  Hospital Course: After uncomplicated surgery, pt was admitted for post op care, VS remained stable with BP well controlled, I/O adequate. Pt had good pain control with PO Dilaudid. She was ambulating well, tolerating general diet , minimal discomfort / pelvic pressure only. Post op labs wnl. She passed bladder voiding test with adequate voided volume and normal PVR volume, So, discharged home without foley.   Discharge Exam: Blood pressure 136/76, pulse 76, temperature 98.8 F (37.1 C), temperature source Oral, resp. rate 18, height 5\' 2"  (1.575 m), weight 66.679 kg (147 lb), SpO2 97.00%. Exam - normal post op.   Disposition: Home.   Discharge Orders    Future Orders Please Complete By Expires   Diet - low sodium heart healthy      Increase activity slowly      Discharge instructions      Comments:   Resume Norvasc tonight. Take Dilaudid (hydromorphone) for pain as prescribed. Resume all other supplements including Aspirin after 1 week.   Driving Restrictions      Comments:   2-4 wks   Lifting restrictions      Comments:   20 lbs limit for at least 6 weeks   Sexual Activity Restrictions      Comments:   None for at least 6 wks   Discharge wound care:      Comments:   Pernieal area to be cleaned and kept dry   Call MD for:  temperature >100.4      Call MD for:  persistant nausea and vomiting      Call MD for:  severe uncontrolled pain      Call MD for:  redness, tenderness, or signs of infection (pain, swelling, redness, odor or green/yellow discharge around incision site)      Call MD for:  difficulty breathing,  headache or visual disturbances      Call MD for:  hives      Call MD for:  persistant dizziness or light-headedness      Call MD for:  extreme fatigue        Current Discharge Medication List    START taking these medications   Details  HYDROmorphone (DILAUDID) 2 MG tablet Take 1 tablet (2 mg total) by mouth every 6 (six) hours as needed. Qty: 20 tablet, Refills: o      CONTINUE these medications which have NOT CHANGED   Details  amLODipine (NORVASC) 10 MG tablet Take 10 mg by mouth daily.      aspirin 325 MG tablet Take 325 mg by mouth daily.      Cholecalciferol 1000 UNITS CHEW Chew 1 tablet by mouth daily.      Glucosamine 500 MG CAPS Take 1 each by mouth daily.      Omega-3 Fatty Acids (FISH OIL MAXIMUM STRENGTH) 1200 MG CAPS Take 1 capsule by mouth daily.      vitamin C (ASCORBIC ACID) 500 MG tablet Take 500 mg by mouth daily.         Follow-up Information    Follow up with Riah Kehoe R in 2 weeks.   Contact information:   [address] [place] [address] [phone]  Signed: Mischelle Reeg R 08/21/2011, 1:42 PM

## 2011-09-08 ENCOUNTER — Encounter (HOSPITAL_COMMUNITY): Payer: Self-pay | Admitting: Obstetrics & Gynecology

## 2012-04-06 ENCOUNTER — Other Ambulatory Visit: Payer: Self-pay | Admitting: Family Medicine

## 2012-05-07 IMAGING — CT CT ABD-PELV W/ CM
2 of 5 series · 17 of 46 positions shown, 19 images · IV contrast (30CC OMNI 300 & [ID] OMNI 300)
Comparison: None

CLINICAL DATA: Abdominal pain.  Elevated white count.  Assess for
appendicitis.

CT ABDOMEN AND PELVIS WITH CONTRAST
TECHNIQUE: Multidetector CT imaging of the abdomen and pelvis was
performed following the standard protocol during bolus
administration of intravenous contrast.
Contrast: 100 ml Kmnipaque-DBB

[Series 2: abdomen w/ · axial · 0.70mm/px · z∈[-380,-20]mm · 14 of 80 slices shown, 16 images]
[im 5/80  soft-tissue]
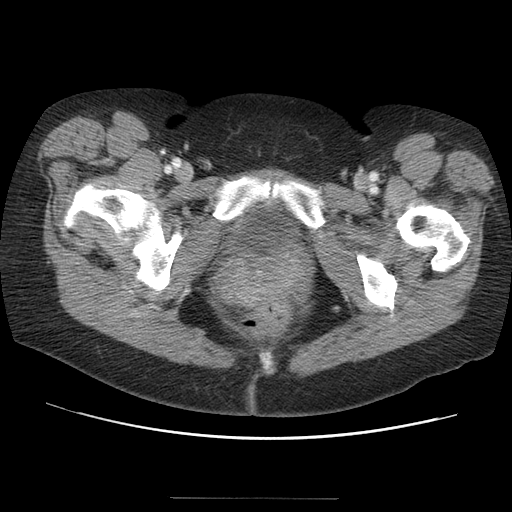
[im 5/80  bone]
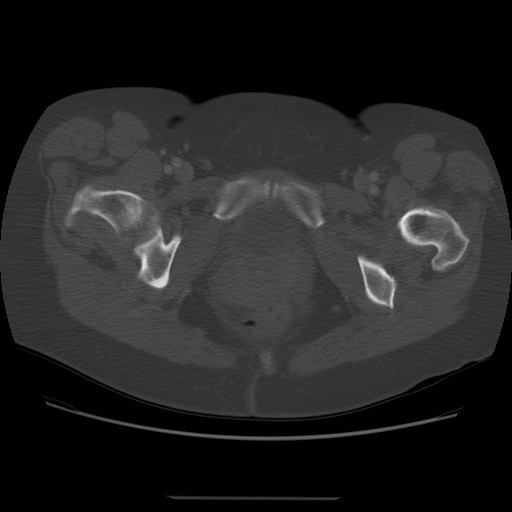
[im 9/80  soft-tissue]
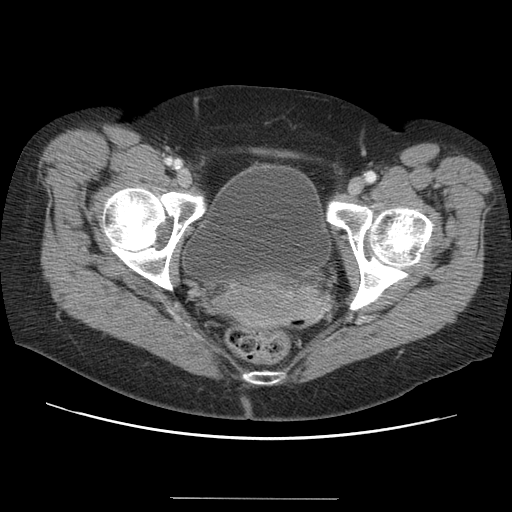
[im 18/80  soft-tissue]
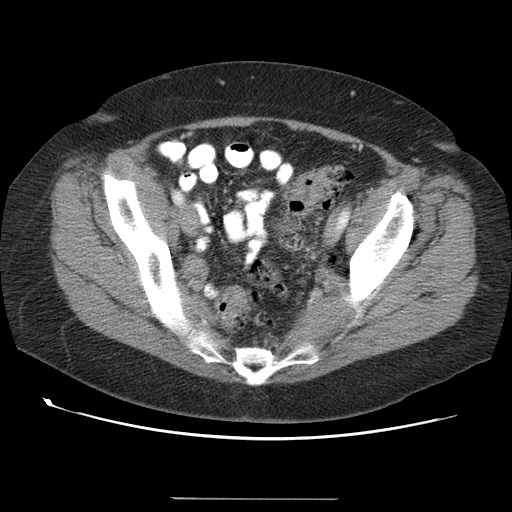
[im 22/80  soft-tissue]
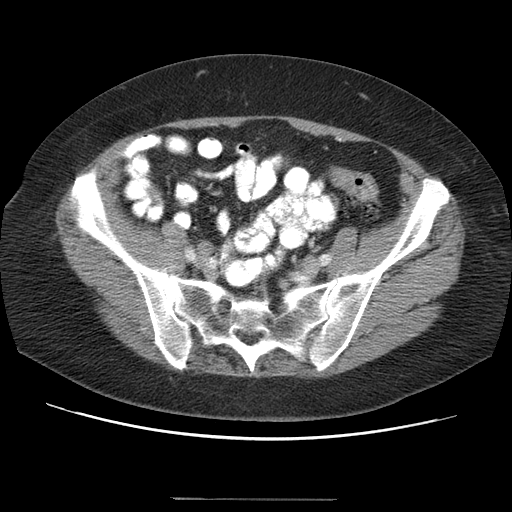
[im 27/80  soft-tissue]
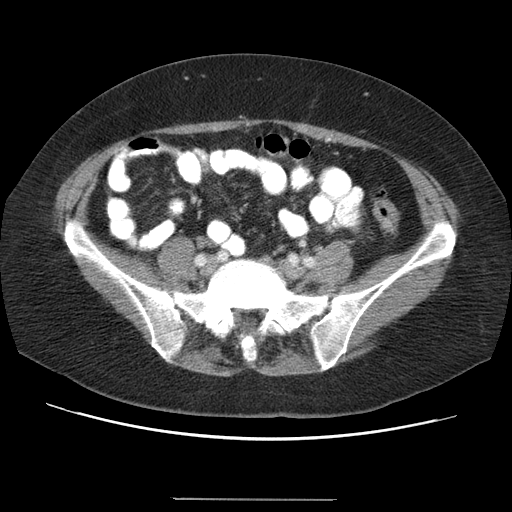
[im 31/80  soft-tissue]
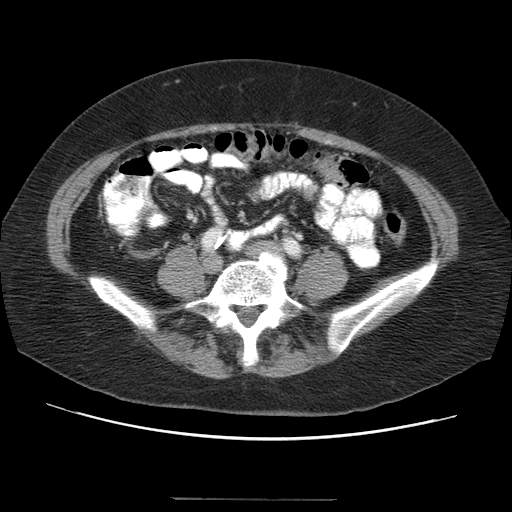
[im 36/80  soft-tissue]
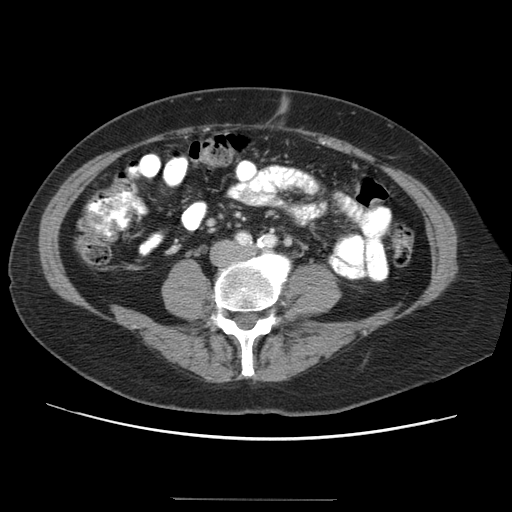
[im 44/80  soft-tissue]
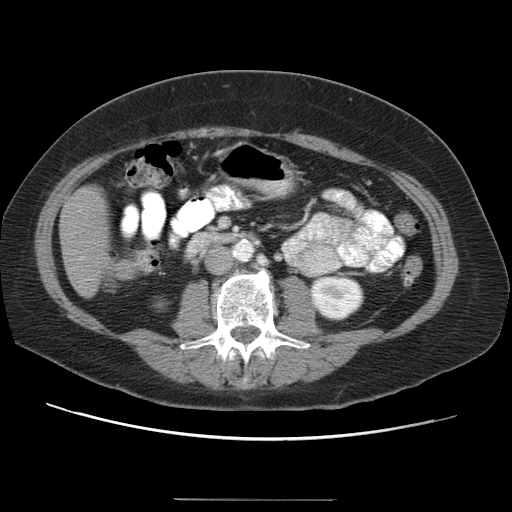
[im 49/80  soft-tissue]
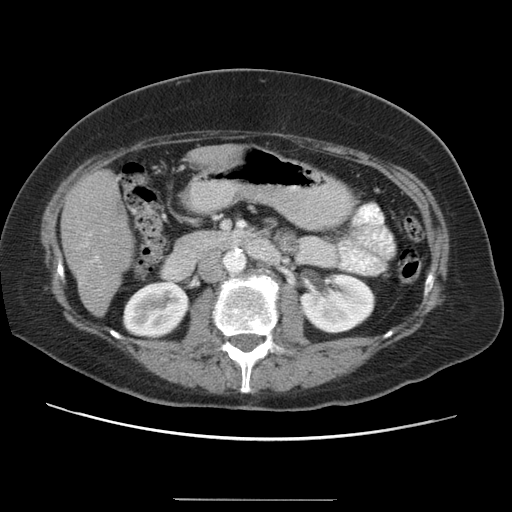
[im 49/80  bone]
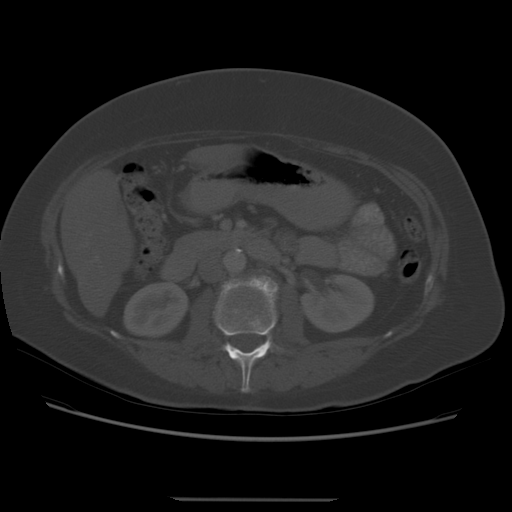
[im 53/80  soft-tissue]
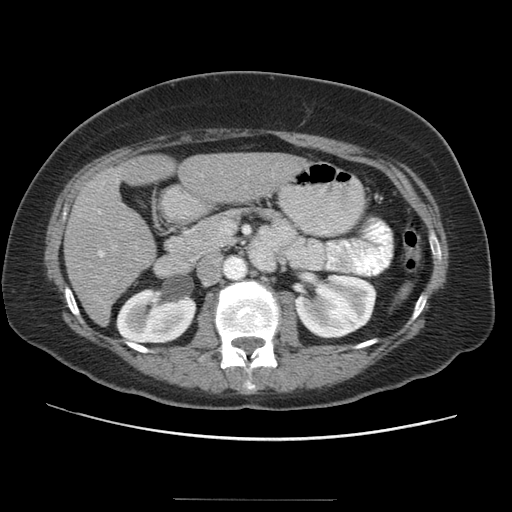
[im 58/80  soft-tissue]
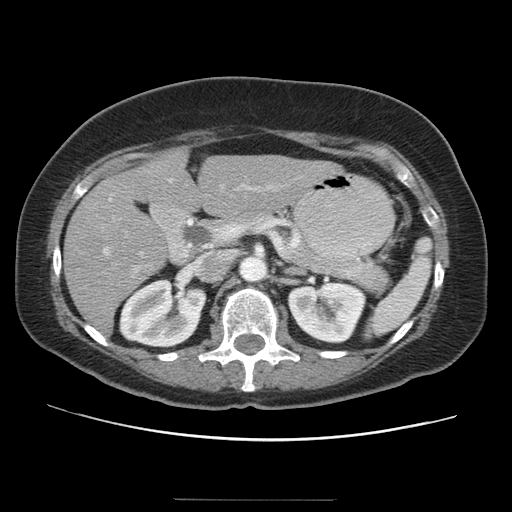
[im 62/80  soft-tissue]
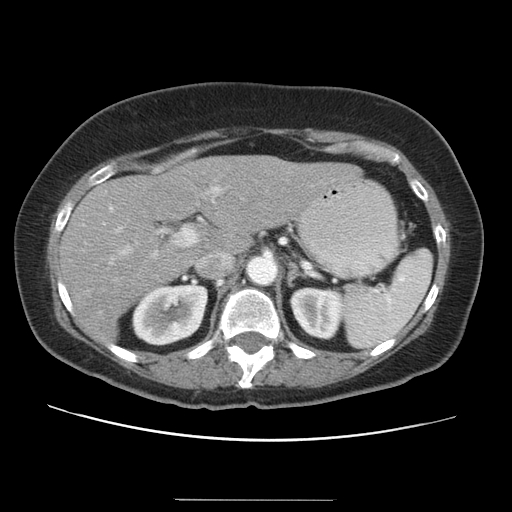
[im 71/80  soft-tissue]
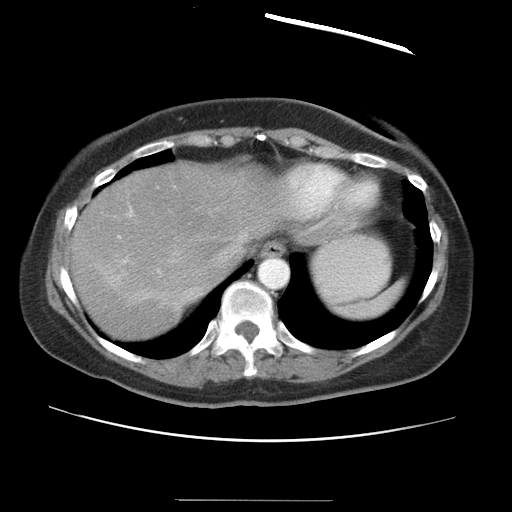
[im 75/80  soft-tissue]
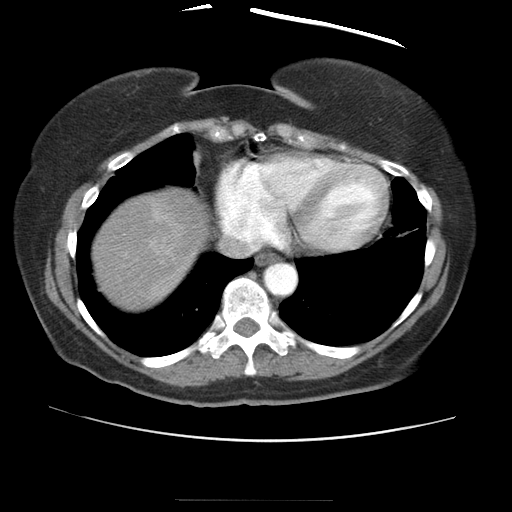

[Series 400: cor · coronal · 0.88mm/px · 3 of 113 slices shown]
[im 38/113  soft-tissue]
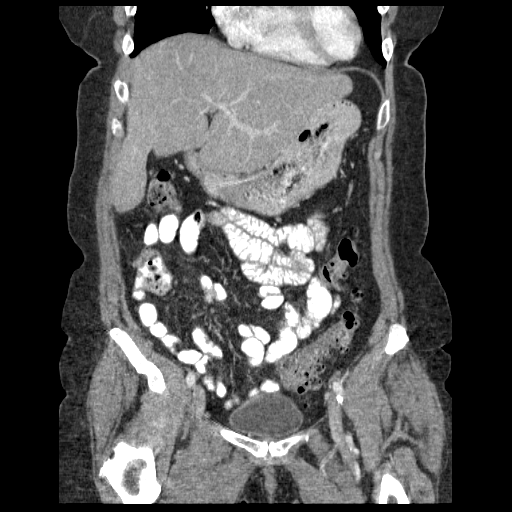
[im 50/113  soft-tissue]
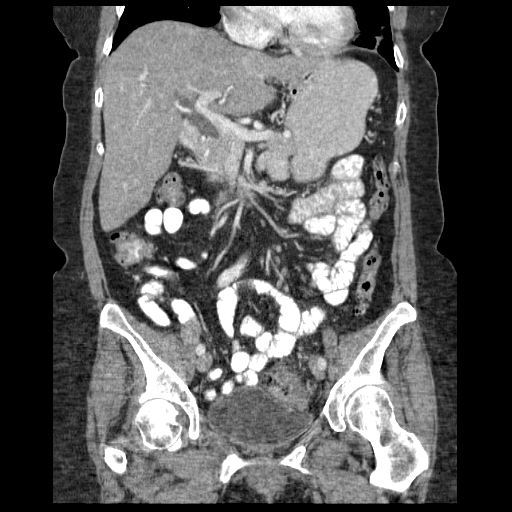
[im 63/113  soft-tissue]
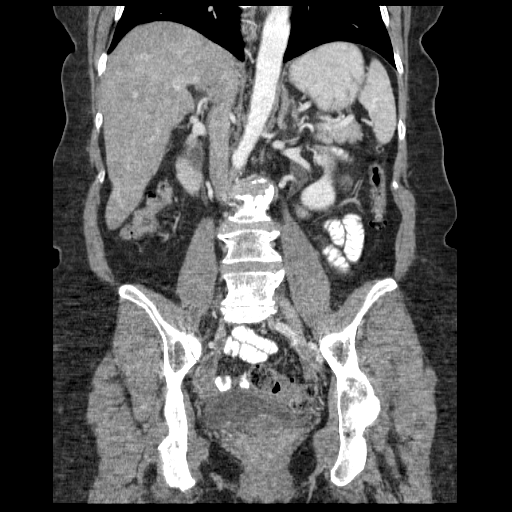

[17 of 46 positions shown; findings below may reference images not displayed]

FINDINGS: There is linear scarring or atelectasis at the left base.
No pleural or pericardial fluid.

There is fatty change of the liver with some areas of geographic
sparing.  There is no worrisome liver lesion.  No biliary ductal
dilatation.  The gallbladder has been previously removed.  The
biliary ductal tree appears normal for that condition.  The spleen
is normal.  The pancreas is normal.  The adrenal glands are normal.
The kidneys are normal.  There is atherosclerosis of the aorta but
no aneurysm.  The IVC is normal.  No retroperitoneal mass or
adenopathy.  No free intraperitoneal fluid or air.  The appendix is
well seen and appears normal.  In the pelvis, there is extensive
diverticulosis of the colon but no discernible diverticulitis.  Low-
level diverticulitis can be inapparent at imaging.  The uterus and
adnexal regions appear unremarkable.
IMPRESSION: Fatty change of the liver with some areas of geographic sparing.

Previous cholecystectomy.

Normal appearing appendix.

Diverticulosis of the sigmoid colon.  No definite diverticulitis.
Low-level diverticulitis can be inapparent at imaging.

## 2012-07-07 ENCOUNTER — Ambulatory Visit (INDEPENDENT_AMBULATORY_CARE_PROVIDER_SITE_OTHER): Payer: PRIVATE HEALTH INSURANCE | Admitting: Family Medicine

## 2012-07-07 ENCOUNTER — Encounter: Payer: Self-pay | Admitting: Family Medicine

## 2012-07-07 VITALS — BP 139/84 | HR 64 | Temp 98.0°F | Resp 16 | Ht 63.0 in | Wt 153.0 lb

## 2012-07-07 DIAGNOSIS — I10 Essential (primary) hypertension: Secondary | ICD-10-CM

## 2012-07-07 DIAGNOSIS — E785 Hyperlipidemia, unspecified: Secondary | ICD-10-CM

## 2012-07-07 MED ORDER — AMLODIPINE BESYLATE 10 MG PO TABS: 10.0000 mg | ORAL_TABLET | Freq: Every day | ORAL | Status: DC

## 2012-07-07 MED ORDER — ROSUVASTATIN CALCIUM 10 MG PO TABS: 10.0000 mg | ORAL_TABLET | Freq: Every day | ORAL | Status: DC

## 2012-07-07 NOTE — Progress Notes (Signed)
61 yo woman who comes in for recheck of hypertension and hyperlipidemia.  She reluctantly went on Crestor 1 year ago and has not experienced any muscle soreness, nausea, or headaches.  Objective: NAD HEENT unremarkable Chest:  Clear Heart:  Regular.  No murmur, gallop\ Ext; minimal (trace) edema Neck:  No bruit or thyromegaly  Assessment:  Stable  Plan: 1. Hypertension  amLODipine (NORVASC) 10 MG tablet, Comprehensive metabolic panel  2. Hyperlipidemia  rosuvastatin (CRESTOR) 10 MG tablet, Lipid panel

## 2012-07-08 LAB — LIPID PANEL
Cholesterol: 226 mg/dL — ABNORMAL HIGH (ref 0–200)
HDL: 62 mg/dL (ref >39)
LDL Cholesterol: 130 mg/dL — ABNORMAL HIGH (ref 0–99)
Total CHOL/HDL Ratio: 3.6 Ratio
Triglycerides: 168 mg/dL — ABNORMAL HIGH (ref <150)
VLDL: 34 mg/dL (ref 0–40)

## 2012-07-08 LAB — COMPREHENSIVE METABOLIC PANEL
ALT: 22 U/L (ref 0–35)
AST: 22 U/L (ref 0–37)
Albumin: 4.6 g/dL (ref 3.5–5.2)
Alkaline Phosphatase: 58 U/L (ref 39–117)
BUN: 13 mg/dL (ref 6–23)
CO2: 26 mEq/L (ref 19–32)
Calcium: 9.5 mg/dL (ref 8.4–10.5)
Chloride: 103 mEq/L (ref 96–112)
Creat: 0.6 mg/dL (ref 0.50–1.10)
Glucose, Bld: 83 mg/dL (ref 70–99)
Potassium: 4.4 mEq/L (ref 3.5–5.3)
Sodium: 139 mEq/L (ref 135–145)
Total Bilirubin: 0.6 mg/dL (ref 0.3–1.2)
Total Protein: 7.6 g/dL (ref 6.0–8.3)

## 2012-07-09 MED ORDER — ROSUVASTATIN CALCIUM 20 MG PO TABS: 20.0000 mg | ORAL_TABLET | Freq: Every day | ORAL | Status: AC

## 2012-07-09 NOTE — Addendum Note (Signed)
Addended by: Johnnette Litter on: 07/09/2012 09:18 AM   Modules accepted: Orders

## 2013-05-02 ENCOUNTER — Encounter: Payer: Self-pay | Admitting: Family Medicine

## 2013-07-16 ENCOUNTER — Other Ambulatory Visit: Payer: Self-pay | Admitting: Family Medicine

## 2013-07-17 NOTE — Telephone Encounter (Signed)
Needs OV, labs 

## 2013-08-19 ENCOUNTER — Other Ambulatory Visit: Payer: Self-pay | Admitting: Physician Assistant

## 2013-08-24 ENCOUNTER — Ambulatory Visit (INDEPENDENT_AMBULATORY_CARE_PROVIDER_SITE_OTHER): Payer: PRIVATE HEALTH INSURANCE | Admitting: Family Medicine

## 2013-08-24 ENCOUNTER — Encounter: Payer: Self-pay | Admitting: Family Medicine

## 2013-08-24 VITALS — BP 138/96 | HR 64 | Temp 98.7°F | Resp 16 | Ht 61.75 in | Wt 149.4 lb

## 2013-08-24 DIAGNOSIS — R202 Paresthesia of skin: Secondary | ICD-10-CM

## 2013-08-24 DIAGNOSIS — R209 Unspecified disturbances of skin sensation: Secondary | ICD-10-CM

## 2013-08-24 DIAGNOSIS — Z0000 Encounter for general adult medical examination without abnormal findings: Secondary | ICD-10-CM

## 2013-08-24 DIAGNOSIS — I10 Essential (primary) hypertension: Secondary | ICD-10-CM | POA: Insufficient documentation

## 2013-08-24 LAB — CBC
HCT: 48.4 % — ABNORMAL HIGH (ref 36.0–46.0)
Hemoglobin: 17.1 g/dL — ABNORMAL HIGH (ref 12.0–15.0)
MCH: 31.9 pg (ref 26.0–34.0)
MCHC: 35.3 g/dL (ref 30.0–36.0)
MCV: 90.3 fL (ref 78.0–100.0)
Platelets: 347 10*3/uL (ref 150–400)
RBC: 5.36 MIL/uL — ABNORMAL HIGH (ref 3.87–5.11)
RDW: 14.4 % (ref 11.5–15.5)
WBC: 7.2 10*3/uL (ref 4.0–10.5)

## 2013-08-24 LAB — POCT URINALYSIS DIPSTICK
Bilirubin, UA: NEGATIVE
Blood, UA: NEGATIVE
Glucose, UA: NEGATIVE
Ketones, UA: NEGATIVE
Leukocytes, UA: NEGATIVE
Nitrite, UA: NEGATIVE
Protein, UA: NEGATIVE
Spec Grav, UA: 1.01
Urobilinogen, UA: 0.2
pH, UA: 7

## 2013-08-24 MED ORDER — AMLODIPINE BESYLATE 10 MG PO TABS: 10.0000 mg | ORAL_TABLET | Freq: Every day | ORAL | Status: DC

## 2013-08-24 NOTE — Patient Instructions (Addendum)

## 2013-08-24 NOTE — Progress Notes (Signed)
  Subjective:    Patient ID: Andrea Floyd, female    DOB: 02-Dec-1951, 62 y.o.   MRN: [id number]  HPI 62 yo woman who works at [employer]. She is originally from [place] and still has family there. She plans on retiring in November of 2016. Patient does have some tingling in her fingers which are occurring irregularly. She has no neck pain or wrist pain.  Recently had ophthalmology check. Status post hysterectomy by Dr. MODY  Review of Systems  Constitutional: Negative.   HENT: Negative.   Eyes: Negative.   Respiratory: Negative.   Cardiovascular: Negative.   Gastrointestinal: Negative.   Endocrine: Negative.   Genitourinary: Negative.   Musculoskeletal: Positive for myalgias.       Joint pain   Skin: Negative.   Allergic/Immunologic: Negative.   Neurological: Negative.   Hematological: Negative.   Psychiatric/Behavioral: Negative.        Objective:   Physical Exam HEENT: Unremarkable Skin: No new lesions or rashes Chest: Clear Heart: Regular no murmur Neck: Supple no adenopathy or thyromegaly Abdomen: Soft nontender, no HSM, right upper quadrant surgical scar Breasts: No masses or nodules Lymph nodes: No neck adenopathy or axillary adenopathy  extremities: Full range of motion both hands and feet, no edema, good peripheral pulses, good range of motion of hips although there is a little crepitus in the right hip with flexion.     Assessment & Plan:  Annual physical exam - Plan: CBC, TSH, Vitamin B12, POCT urinalysis dipstick, CANCELED: POCT UA - Microscopic Only  Hypertension - Plan: amLODipine (NORVASC) 10 MG tablet  Paresthesias We'll check TSH, B12 level, CBC.  Signed, Elvina Sidle, MD

## 2013-08-25 LAB — TSH: TSH: 2.806 u[IU]/mL (ref 0.350–4.500)

## 2013-08-25 LAB — VITAMIN B12: Vitamin B-12: 312 pg/mL (ref 211–911)

## 2013-11-02 ENCOUNTER — Other Ambulatory Visit: Payer: Self-pay

## 2013-11-25 ENCOUNTER — Ambulatory Visit (INDEPENDENT_AMBULATORY_CARE_PROVIDER_SITE_OTHER): Payer: PRIVATE HEALTH INSURANCE | Admitting: Family Medicine

## 2013-11-25 ENCOUNTER — Ambulatory Visit: Payer: PRIVATE HEALTH INSURANCE

## 2013-11-25 VITALS — BP 136/86 | HR 78 | Temp 98.2°F | Resp 16 | Ht 61.0 in | Wt 151.0 lb

## 2013-11-25 DIAGNOSIS — R05 Cough: Secondary | ICD-10-CM

## 2013-11-25 DIAGNOSIS — J209 Acute bronchitis, unspecified: Secondary | ICD-10-CM

## 2013-11-25 DIAGNOSIS — R059 Cough, unspecified: Secondary | ICD-10-CM

## 2013-11-25 LAB — POCT CBC
Granulocyte percent: 64.9 %G (ref 37–80)
Hemoglobin: 13.7 g/dL (ref 12.2–16.2)
MID (cbc): 0.7 (ref 0–0.9)
MPV: 9.7 fL (ref 0–99.8)
POC Granulocyte: 4.7 (ref 2–6.9)
POC MID %: 9.1 %M (ref 0–12)
Platelet Count, POC: 264 10*3/uL (ref 142–424)
RBC: 4.39 M/uL (ref 4.04–5.48)
WBC: 7.3 10*3/uL (ref 4.6–10.2)

## 2013-11-25 MED ORDER — ALBUTEROL SULFATE HFA 108 (90 BASE) MCG/ACT IN AERS: 2.0000 | INHALATION_SPRAY | RESPIRATORY_TRACT | Status: DC | PRN

## 2013-11-25 MED ORDER — BENZONATATE 100 MG PO CAPS: 100.0000 mg | ORAL_CAPSULE | Freq: Three times a day (TID) | ORAL | Status: DC | PRN

## 2013-11-25 MED ORDER — LEVOFLOXACIN 500 MG PO TABS: 500.0000 mg | ORAL_TABLET | Freq: Every day | ORAL | Status: DC

## 2013-11-25 NOTE — Patient Instructions (Signed)
Taking Levaquin one daily for infection  Take the Tessalon one or two pills 3 times daily as needed for cough  Use the albuterol inhaler 2 puffs every 4-6 hours to try and open up the lungs  Drink plenty of fluids and get enough rest  Return if worse

## 2013-11-25 NOTE — Progress Notes (Signed)
Subjective:     [facility] reading (PRIMARY) by  Dr. Alwyn Ren Increased markings at bases but no frank infiltrate  Results for orders placed in visit on 11/25/13  POCT CBC      Result Value Range   WBC 7.3  4.6 - 10.2 K/uL   Lymph, poc 1.9  0.6 - 3.4   POC LYMPH PERCENT 26.0  10 - 50 %L   MID (cbc) 0.7  0 - 0.9   POC MID % 9.1  0 - 12 %M   POC Granulocyte 4.7  2 - 6.9   Granulocyte percent 64.9  37 - 80 %G   RBC 4.39  4.04 - 5.48 M/uL   Hemoglobin 13.7  12.2 - 16.2 g/dL   HCT, POC 16.1  09.6 - 47.9 %   MCV 100.8 (*) 80 - 97 fL   MCH, POC 31.2  27 - 31.2 pg   MCHC 30.9 (*) 31.8 - 35.4 g/dL   RDW, POC 04.5     Platelet Count, POC 264  142 - 424 K/uL   MPV 9.7  0 - 99.8 fL    Assessment: Bronchitis  Plan:

## 2014-09-02 ENCOUNTER — Other Ambulatory Visit: Payer: Self-pay | Admitting: Family Medicine

## 2014-10-02 ENCOUNTER — Other Ambulatory Visit: Payer: Self-pay | Admitting: Family Medicine

## 2014-10-08 ENCOUNTER — Encounter: Payer: Self-pay | Admitting: Family Medicine

## 2014-10-08 ENCOUNTER — Ambulatory Visit (INDEPENDENT_AMBULATORY_CARE_PROVIDER_SITE_OTHER): Payer: PRIVATE HEALTH INSURANCE | Admitting: Family Medicine

## 2014-10-08 VITALS — BP 150/88 | HR 66 | Temp 97.7°F | Resp 16 | Ht 61.75 in | Wt 152.6 lb

## 2014-10-08 DIAGNOSIS — I10 Essential (primary) hypertension: Secondary | ICD-10-CM

## 2014-10-08 DIAGNOSIS — L570 Actinic keratosis: Secondary | ICD-10-CM

## 2014-10-08 DIAGNOSIS — Z13220 Encounter for screening for lipoid disorders: Secondary | ICD-10-CM

## 2014-10-08 DIAGNOSIS — Z0000 Encounter for general adult medical examination without abnormal findings: Secondary | ICD-10-CM

## 2014-10-08 DIAGNOSIS — Z1211 Encounter for screening for malignant neoplasm of colon: Secondary | ICD-10-CM

## 2014-10-08 DIAGNOSIS — E785 Hyperlipidemia, unspecified: Secondary | ICD-10-CM

## 2014-10-08 LAB — COMPLETE METABOLIC PANEL WITH GFR
ALT: 21 U/L (ref 0–35)
AST: 26 U/L (ref 0–37)
Albumin: 4.8 g/dL (ref 3.5–5.2)
Alkaline Phosphatase: 73 U/L (ref 39–117)
BUN: 11 mg/dL (ref 6–23)
CO2: 26 mEq/L (ref 19–32)
Calcium: 9.7 mg/dL (ref 8.4–10.5)
Chloride: 99 mEq/L (ref 96–112)
Creat: 0.69 mg/dL (ref 0.50–1.10)
GFR, Est African American: >89 mL/min
GFR, Est Non African American: >89 mL/min
Glucose, Bld: 87 mg/dL (ref 70–99)
Potassium: 4.2 mEq/L (ref 3.5–5.3)
Sodium: 137 mEq/L (ref 135–145)
Total Bilirubin: 0.7 mg/dL (ref 0.2–1.2)
Total Protein: 7.8 g/dL (ref 6.0–8.3)

## 2014-10-08 LAB — CBC WITH DIFFERENTIAL/PLATELET
Basophils Absolute: 0.1 10*3/uL (ref 0.0–0.1)
Basophils Relative: 1 % (ref 0–1)
Eosinophils Absolute: 0.3 10*3/uL (ref 0.0–0.7)
Eosinophils Relative: 4 % (ref 0–5)
HCT: 48.8 % — ABNORMAL HIGH (ref 36.0–46.0)
Hemoglobin: 16.8 g/dL — ABNORMAL HIGH (ref 12.0–15.0)
Lymphocytes Relative: 31 % (ref 12–46)
Lymphs Abs: 2.5 10*3/uL (ref 0.7–4.0)
MCH: 31.7 pg (ref 26.0–34.0)
MCHC: 34.4 g/dL (ref 30.0–36.0)
MCV: 92.1 fL (ref 78.0–100.0)
Monocytes Absolute: 0.9 10*3/uL (ref 0.1–1.0)
Monocytes Relative: 11 % (ref 3–12)
Neutro Abs: 4.3 10*3/uL (ref 1.7–7.7)
Neutrophils Relative %: 53 % (ref 43–77)
Platelets: 402 10*3/uL — ABNORMAL HIGH (ref 150–400)
RBC: 5.3 MIL/uL — ABNORMAL HIGH (ref 3.87–5.11)
RDW: 13.7 % (ref 11.5–15.5)
WBC: 8.1 10*3/uL (ref 4.0–10.5)

## 2014-10-08 LAB — LIPID PANEL
Cholesterol: 239 mg/dL — ABNORMAL HIGH (ref 0–200)
HDL: 63 mg/dL (ref >39)
LDL Cholesterol: 141 mg/dL — ABNORMAL HIGH (ref 0–99)
Total CHOL/HDL Ratio: 3.8 Ratio
Triglycerides: 177 mg/dL — ABNORMAL HIGH (ref <150)
VLDL: 35 mg/dL (ref 0–40)

## 2014-10-08 LAB — POCT URINALYSIS DIPSTICK
Bilirubin, UA: NEGATIVE
Blood, UA: NEGATIVE
Glucose, UA: NEGATIVE
Ketones, UA: NEGATIVE
Leukocytes, UA: NEGATIVE
Nitrite, UA: NEGATIVE
Protein, UA: NEGATIVE
Spec Grav, UA: 1.01
Urobilinogen, UA: 0.2
pH, UA: 7

## 2014-10-08 MED ORDER — ATORVASTATIN CALCIUM 20 MG PO TABS: 20.0000 mg | ORAL_TABLET | Freq: Every day | ORAL | Status: DC

## 2014-10-08 MED ORDER — ZOSTER VACCINE LIVE 19400 UNT/0.65ML ~~LOC~~ SOLR: 0.6500 mL | Freq: Once | SUBCUTANEOUS | Status: DC

## 2014-10-08 MED ORDER — AMLODIPINE BESYLATE 10 MG PO TABS: 10.0000 mg | ORAL_TABLET | Freq: Every day | ORAL | Status: DC

## 2014-10-08 NOTE — Patient Instructions (Signed)
Immunization Schedule, Adult  Influenza vaccine.  All adults should be immunized every year.  All adults, including pregnant women and people with hives-only allergy to eggs can receive the inactivated influenza (IIV) vaccine.  Adults aged 65-49 years can receive the recombinant influenza (RIV) vaccine. The RIV vaccine does not contain any egg protein.  Adults aged 76 years or older can receive the standard-dose IIV or the high-dose IIV.  Tetanus, diphtheria, and acellular pertussis (Td, Tdap) vaccine.  Pregnant women should receive 1 dose of Tdap vaccine during each pregnancy. The dose should be obtained regardless of the length of time since the last dose. Immunization is preferred during the 27th to 36th week of gestation.  An adult who has not previously received Tdap or who does not know his or her vaccine status should receive 1 dose of Tdap. This initial dose should be followed by tetanus and diphtheria toxoids (Td) booster doses every 10 years.  Adults with an unknown or incomplete history of completing a 3-dose immunization series with Td-containing vaccines should begin or complete a primary immunization series including a Tdap dose.  Adults should receive a Td booster every 10 years.  Varicella vaccine.  An adult without evidence of immunity to varicella should receive 2 doses or a second dose if he or she has previously received 1 dose.  Pregnant females who do not have evidence of immunity should receive the first dose after pregnancy. This first dose should be obtained before leaving the health care facility. The second dose should be obtained 4-8 weeks after the first dose.  Human papillomavirus (HPV) vaccine.  Females aged 13-26 years who have not received the vaccine previously should obtain the 3-dose series.  The vaccine is not recommended for use in pregnant females. However, pregnancy testing is not needed before receiving a dose. If a female is found to be  pregnant after receiving a dose, no treatment is needed. In that case, the remaining doses should be delayed until after the pregnancy.  Males aged 37-21 years who have not received the vaccine previously should receive the 3-dose series. Males aged 22-26 years may be immunized.  Immunization is recommended through the age of 93 years for any female who has sex with males and did not get any or all doses earlier.  Immunization is recommended for any person with an immunocompromised condition through the age of 71 years if he or she did not get any or all doses earlier.  During the 3-dose series, the second dose should be obtained 4-8 weeks after the first dose. The third dose should be obtained 24 weeks after the first dose and 16 weeks after the second dose.  Zoster vaccine.  One dose is recommended for adults aged 73 years or older unless certain conditions are present.  Measles, mumps, and rubella (MMR) vaccine.  Adults born before 35 generally are considered immune to measles and mumps.  Adults born in [birth year] or later should have 1 or more doses of MMR vaccine unless there is a contraindication to the vaccine or there is laboratory evidence of immunity to each of the three diseases.  A routine second dose of MMR vaccine should be obtained at least 28 days after the first dose for students attending postsecondary schools, health care workers, or international travelers.  People who received inactivated measles vaccine or an unknown type of measles vaccine during 1963-1967 should receive 2 doses of MMR vaccine.  People who received inactivated mumps vaccine or an unknown type  of mumps vaccine before 1979 and are at high risk for mumps infection should consider immunization with 2 doses of MMR vaccine.  For females of childbearing age, rubella immunity should be determined. If there is no evidence of immunity, females who are not pregnant should be vaccinated. If there is no evidence of  immunity, females who are pregnant should delay immunization until after pregnancy.  Unvaccinated health care workers born before 1957 who lack laboratory evidence of measles, mumps, or rubella immunity or laboratory confirmation of disease should consider measles and mumps immunization with 2 doses of MMR vaccine or rubella immunization with 1 dose of MMR vaccine.  Pneumococcal 13-valent conjugate (PCV13) vaccine.  When indicated, a person who is uncertain of his or her immunization history and has no record of immunization should receive the PCV13 vaccine.  An adult aged 19 years or older who has certain medical conditions and has not been previously immunized should receive 1 dose of PCV13 vaccine. This PCV13 should be followed with a dose of pneumococcal polysaccharide (PPSV23) vaccine. The PPSV23 vaccine dose should be obtained at least 8 weeks after the dose of PCV13 vaccine.  An adult aged 19 years or older who has certain medical conditions and previously received 1 or more doses of PPSV23 vaccine should receive 1 dose of PCV13. The PCV13 vaccine dose should be obtained 1 or more years after the last PPSV23 vaccine dose.  Pneumococcal polysaccharide (PPSV23) vaccine.  When PCV13 is also indicated, PCV13 should be obtained first.  All adults aged 65 years and older should be immunized.  An adult younger than age 65 years who has certain medical conditions should be immunized.  Any person who resides in a nursing home or long-term care facility should be immunized.  An adult smoker should be immunized.  People with an immunocompromised condition and certain other conditions should receive both PCV13 and PPSV23 vaccines.  People with human immunodeficiency virus (HIV) infection should be immunized as soon as possible after diagnosis.  Immunization during chemotherapy or radiation therapy should be avoided.  Routine use of PPSV23 vaccine is not recommended for American Indians,  [place] Natives, or people younger than 65 years unless there are medical conditions that require PPSV23 vaccine.  When indicated, people who have unknown immunization and have no record of immunization should receive PPSV23 vaccine.  One-time revaccination 5 years after the first dose of PPSV23 is recommended for people aged 19-64 years who have chronic kidney failure, nephrotic syndrome, asplenia, or immunocompromised conditions.  People who received 1-2 doses of PPSV23 before age 65 years should receive another dose of PPSV23 vaccine at age 65 years or later if at least 5 years have passed since the previous dose.  Doses of PPSV23 are not needed for people immunized with PPSV23 at or after age 65 years.  Meningococcal vaccine.  Adults with asplenia or persistent complement component deficiencies should receive 2 doses of quadrivalent meningococcal conjugate (MenACWY-D) vaccine. The doses should be obtained at least 2 months apart.  Microbiologists working with certain meningococcal bacteria, military recruits, people at risk during an outbreak, and people who travel to or live in countries with a high rate of meningitis should be immunized.  A first-year college student up through age 21 years who is living in a residence hall should receive a dose if he or she did not receive a dose on or after his or her 16th birthday.  Adults who have certain high-risk conditions should receive one or more doses   of vaccine.  Hepatitis A vaccine.  Adults who wish to be protected from this disease, have certain high-risk conditions, work with hepatitis A-infected animals, work in hepatitis A research labs, or travel to or work in countries with a high rate of hepatitis A should be immunized.  Adults who were previously unvaccinated and who anticipate close contact with an international adoptee during the first 60 days after arrival in the [place] from a country with a high rate of hepatitis A should  be immunized.  Hepatitis B vaccine.  Adults who wish to be protected from this disease, have certain high-risk conditions, may be exposed to blood or other infectious body fluids, are household contacts or sex partners of hepatitis B positive people, are clients or workers in certain care facilities, or travel to or work in countries with a high rate of hepatitis B should be immunized.  Haemophilus influenzae type b (Hib) vaccine.  A previously unvaccinated person with asplenia or sickle cell disease or having a scheduled splenectomy should receive 1 dose of Hib vaccine.  Regardless of previous immunization, a recipient of a hematopoietic stem cell transplant should receive a 3-dose series 6-12 months after his or her successful transplant.  Hib vaccine is not recommended for adults with HIV infection. Document Released: 03/05/2004 Document Revised: 04/10/2013 Document Reviewed: 01/31/2013 [facility] Patient Information 2015 [place], [place]. This information is not intended to replace advice given to you by your health care provider. Make sure you discuss any questions you have with your health care provider. Health Maintenance Adopting a healthy lifestyle and getting preventive care can go a long way to promote health and wellness. Talk with your health care provider about what schedule of regular examinations is right for you. This is a good chance for you to check in with your provider about disease prevention and staying healthy. In between checkups, there are plenty of things you can do on your own. Experts have done a lot of research about which lifestyle changes and preventive measures are most likely to keep you healthy. Ask your health care provider for more information. WEIGHT AND DIET  Eat a healthy diet  Be sure to include plenty of vegetables, fruits, low-fat dairy products, and lean protein.  Do not eat a lot of foods high in solid fats, added sugars, or salt.  Get regular  exercise. This is one of the most important things you can do for your health.  Most adults should exercise for at least 150 minutes each week. The exercise should increase your heart rate and make you sweat (moderate-intensity exercise).  Most adults should also do strengthening exercises at least twice a week. This is in addition to the moderate-intensity exercise.  Maintain a healthy weight  Body mass index (BMI) is a measurement that can be used to identify possible weight problems. It estimates body fat based on height and weight. Your health care provider can help determine your BMI and help you achieve or maintain a healthy weight.  For females 41 years of age and older:   A BMI below 18.5 is considered underweight.  A BMI of 18.5 to 24.9 is normal.  A BMI of 25 to 29.9 is considered overweight.  A BMI of 30 and above is considered obese.  Watch levels of cholesterol and blood lipids  You should start having your blood tested for lipids and cholesterol at 65 years of age, then have this test every 5 years.  You may need to have your  cholesterol levels checked more often if:  Your lipid or cholesterol levels are high.  You are older than 66 years of age.  You are at high risk for heart disease.  CANCER SCREENING   Lung Cancer  Lung cancer screening is recommended for adults 27-54 years old who are at high risk for lung cancer because of a history of smoking.  A yearly low-dose CT scan of the lungs is recommended for people who:  Currently smoke.  Have quit within the past 15 years.  Have at least a 30-pack-year history of smoking. A pack year is smoking an average of one pack of cigarettes a day for 1 year.  Yearly screening should continue until it has been 15 years since you quit.  Yearly screening should stop if you develop a health problem that would prevent you from having lung cancer treatment.  Breast Cancer  Practice breast self-awareness. This  means understanding how your breasts normally appear and feel.  It also means doing regular breast self-exams. Let your health care provider know about any changes, no matter how small.  If you are in your 20s or 30s, you should have a clinical breast exam (CBE) by a health care provider every 1-3 years as part of a regular health exam.  If you are 30 or older, have a CBE every year. Also consider having a breast X-ray (mammogram) every year.  If you have a family history of breast cancer, talk to your health care provider about genetic screening.  If you are at high risk for breast cancer, talk to your health care provider about having an MRI and a mammogram every year.  Breast cancer gene (BRCA) assessment is recommended for women who have family members with BRCA-related cancers. BRCA-related cancers include:  Breast.  Ovarian.  Tubal.  Peritoneal cancers.  Results of the assessment will determine the need for genetic counseling and BRCA1 and BRCA2 testing. Cervical Cancer Routine pelvic examinations to screen for cervical cancer are no longer recommended for nonpregnant women who are considered low risk for cancer of the pelvic organs (ovaries, uterus, and vagina) and who do not have symptoms. A pelvic examination may be necessary if you have symptoms including those associated with pelvic infections. Ask your health care provider if a screening pelvic exam is right for you.   The Pap test is the screening test for cervical cancer for women who are considered at risk.  If you had a hysterectomy for a problem that was not cancer or a condition that could lead to cancer, then you no longer need Pap tests.  If you are older than 65 years, and you have had normal Pap tests for the past 10 years, you no longer need to have Pap tests.  If you have had past treatment for cervical cancer or a condition that could lead to cancer, you need Pap tests and screening for cancer for at least 20  years after your treatment.  If you no longer get a Pap test, assess your risk factors if they change (such as having a new sexual partner). This can affect whether you should start being screened again.  Some women have medical problems that increase their chance of getting cervical cancer. If this is the case for you, your health care provider may recommend more frequent screening and Pap tests.  The human papillomavirus (HPV) test is another test that may be used for cervical cancer screening. The HPV test looks for the virus that  can cause cell changes in the cervix. The cells collected during the Pap test can be tested for HPV.  The HPV test can be used to screen women 63 years of age and older. Getting tested for HPV can extend the interval between normal Pap tests from three to five years.  An HPV test also should be used to screen women of any age who have unclear Pap test results.  After 65 years of age, women should have HPV testing as often as Pap tests.  Colorectal Cancer  This type of cancer can be detected and often prevented.  Routine colorectal cancer screening usually begins at 65 years of age and continues through 65 years of age.  Your health care provider may recommend screening at an earlier age if you have risk factors for colon cancer.  Your health care provider may also recommend using home test kits to check for hidden blood in the stool.  A small camera at the end of a tube can be used to examine your colon directly (sigmoidoscopy or colonoscopy). This is done to check for the earliest forms of colorectal cancer.  Routine screening usually begins at age 36.  Direct examination of the colon should be repeated every 5-10 years through 65 years of age. However, you may need to be screened more often if early forms of precancerous polyps or small growths are found. Skin Cancer  Check your skin from head to toe regularly.  Tell your health care provider about any  new moles or changes in moles, especially if there is a change in a mole's shape or color.  Also tell your health care provider if you have a mole that is larger than the size of a pencil eraser.  Always use sunscreen. Apply sunscreen liberally and repeatedly throughout the day.  Protect yourself by wearing long sleeves, pants, a wide-brimmed hat, and sunglasses whenever you are outside. HEART DISEASE, DIABETES, AND HIGH BLOOD PRESSURE   Have your blood pressure checked at least every 1-2 years. High blood pressure causes heart disease and increases the risk of stroke.  If you are between 26 years and 98 years old, ask your health care provider if you should take aspirin to prevent strokes.  Have regular diabetes screenings. This involves taking a blood sample to check your fasting blood sugar level.  If you are at a normal weight and have a low risk for diabetes, have this test once every three years after 65 years of age.  If you are overweight and have a high risk for diabetes, consider being tested at a younger age or more often. PREVENTING INFECTION  Hepatitis B  If you have a higher risk for hepatitis B, you should be screened for this virus. You are considered at high risk for hepatitis B if:  You were born in a country where hepatitis B is common. Ask your health care provider which countries are considered high risk.  Your parents were born in a high-risk country, and you have not been immunized against hepatitis B (hepatitis B vaccine).  You have HIV or AIDS.  You use needles to inject street drugs.  You live with someone who has hepatitis B.  You have had sex with someone who has hepatitis B.  You get hemodialysis treatment.  You take certain medicines for conditions, including cancer, organ transplantation, and autoimmune conditions. Hepatitis C  Blood testing is recommended for:  Everyone born from [birth year] through 1967.  Anyone with known risk factors  for  hepatitis C. Sexually transmitted infections (STIs)  You should be screened for sexually transmitted infections (STIs) including gonorrhea and chlamydia if:  You are sexually active and are younger than 65 years of age.  You are older than 65 years of age and your health care provider tells you that you are at risk for this type of infection.  Your sexual activity has changed since you were last screened and you are at an increased risk for chlamydia or gonorrhea. Ask your health care provider if you are at risk.  If you do not have HIV, but are at risk, it may be recommended that you take a prescription medicine daily to prevent HIV infection. This is called pre-exposure prophylaxis (PrEP). You are considered at risk if:  You are sexually active and do not regularly use condoms or know the HIV status of your partner(s).  You take drugs by injection.  You are sexually active with a partner who has HIV. Talk with your health care provider about whether you are at high risk of being infected with HIV. If you choose to begin PrEP, you should first be tested for HIV. You should then be tested every 3 months for as long as you are taking PrEP.  PREGNANCY   If you are premenopausal and you may become pregnant, ask your health care provider about preconception counseling.  If you may become pregnant, take 400 to 800 micrograms (mcg) of folic acid every day.  If you want to prevent pregnancy, talk to your health care provider about birth control (contraception). OSTEOPOROSIS AND MENOPAUSE   Osteoporosis is a disease in which the bones lose minerals and strength with aging. This can result in serious bone fractures. Your risk for osteoporosis can be identified using a bone density scan.  If you are 31 years of age or older, or if you are at risk for osteoporosis and fractures, ask your health care provider if you should be screened.  Ask your health care provider whether you should take a  calcium or vitamin D supplement to lower your risk for osteoporosis.  Menopause may have certain physical symptoms and risks.  Hormone replacement therapy may reduce some of these symptoms and risks. Talk to your health care provider about whether hormone replacement therapy is right for you.  HOME CARE INSTRUCTIONS   Schedule regular health, dental, and eye exams.  Stay current with your immunizations.   Do not use any tobacco products including cigarettes, chewing tobacco, or electronic cigarettes.  If you are pregnant, do not drink alcohol.  If you are breastfeeding, limit how much and how often you drink alcohol.  Limit alcohol intake to no more than 1 drink per day for nonpregnant women. One drink equals 12 ounces of beer, 5 ounces of wine, or 1 ounces of hard liquor.  Do not use street drugs.  Do not share needles.  Ask your health care provider for help if you need support or information about quitting drugs.  Tell your health care provider if you often feel depressed.  Tell your health care provider if you have ever been abused or do not feel safe at home. Document Released: 06/29/2011 Document Revised: 04/30/2014 Document Reviewed: 11/15/2013 [facility] Patient Information 2015 [place], [place]. This information is not intended to replace advice given to you by your health care provider. Make sure you discuss any questions you have with your health care provider.

## 2014-10-08 NOTE — Progress Notes (Addendum)
This chart was scribed for Robyn Haber, MD by Ladene Artist, ED Scribe. The patient was seen in room 27. Patient's care was started at 9:07 AM.  Patient ID: Andrea Floyd MRN: [id number], DOB: [birth year], 63 y.o. Date of Encounter: 10/08/2014, 9:07 AM  Primary Physician: No primary provider on file.  Chief Complaint  Patient presents with   Annual Exam    no pap    HPI: 63 y.o. year old female with history below presents for annual exam. Last mammogram was last year; pt plans to schedule one for this year. Last colonoscopy was approximately 10 years ago. She report family history. Last tetanus unknown but she suspects it is UTD. Pt has not received the shingles vaccine but requests it at this visit. She also has not had the flu vaccine. Pt reports intermittent rash to L lower leg that she describes as a "band". She denies itching. Pt denies rash in the morning but states that it's more noticeable as the day progresses. She also presents a scaly area to the nose bridge where her glasses rest. She states that she first noted this area a few months ago. Never had colonoscopy and agrees to have this done   Pt still works at [employer]; she plans to work until next November. Pt is from [place], [place]; last visited in July for her father's memorial. Pt's mother recently moved into assisted living. Her daughter manages her husband's law firm and stays at home with the children.   Past Medical History  Diagnosis Date   Hypertension    Hypothyroidism     h/o yrs ago- no meds now     Home Meds: Prior to Admission medications   Medication Sig Start Date End Date Taking? Authorizing Provider  albuterol (PROVENTIL HFA;VENTOLIN HFA) 108 (90 BASE) MCG/ACT inhaler Inhale 2 puffs into the lungs every 4 (four) hours as needed for wheezing or shortness of breath (cough, shortness of breath or wheezing.). 11/25/13   Posey Boyer, MD  amLODipine (NORVASC) 10 MG tablet TAKE 1 TABLET (10 MG  TOTAL) BY MOUTH DAILY. PATIENT NEEDS OFFICE VISIT FOR ADDITIONAL REFILLS 10/03/14   Robyn Haber, MD  aspirin 81 MG tablet Take 81 mg by mouth daily.    Historical Provider, MD  benzonatate (TESSALON) 100 MG capsule Take 1-2 capsules (100-200 mg total) by mouth 3 (three) times daily as needed for cough. 11/25/13   Posey Boyer, MD  calcium-vitamin D (OSCAL WITH D) 500-200 MG-UNIT per tablet Take 1 tablet by mouth.    Historical Provider, MD  Glucosamine 500 MG CAPS Take 1 each by mouth daily.      Historical Provider, MD  levofloxacin (LEVAQUIN) 500 MG tablet Take 1 tablet (500 mg total) by mouth daily. 11/25/13   Posey Boyer, MD  Omega-3 Fatty Acids (FISH OIL MAXIMUM STRENGTH) 1200 MG CAPS Take 1 capsule by mouth daily.      Historical Provider, MD  vitamin C (ASCORBIC ACID) 500 MG tablet Take 500 mg by mouth daily.      Historical Provider, MD    Allergies:  Allergies  Allergen Reactions   Codeine Shortness Of Breath   Demerol Nausea And Vomiting    History   Social History   Marital Status: Married    Spouse Name: N/A    Number of Children: N/A   Years of Education: N/A   Occupational History   Not on file.   Social History Main Topics   Smoking status: Current  Every Day Smoker -- 0.25 packs/day    Types: Cigarettes   Smokeless tobacco: Not on file   Alcohol Use: 0.0 oz/week    1-2 Glasses of wine per week     Comment: daily   Drug Use: No   Sexual Activity: Not on file   Other Topics Concern   Not on file   Social History Narrative   5 pregnancies   4 living    1 miscarriage     Review of Systems: Constitutional: negative for chills, fever, night sweats, weight changes, or fatigue  HEENT: negative for vision changes, hearing loss, congestion, rhinorrhea, ST, epistaxis, or sinus pressure Cardiovascular: negative for chest pain or palpitations Respiratory: negative for hemoptysis, wheezing, shortness of breath, or cough Abdominal: negative for  abdominal pain, nausea, vomiting, diarrhea, or constipation Dermatological: +rash Neurologic: negative for headache, dizziness, or syncope All other systems reviewed and are otherwise negative with the exception to those above and in the HPI.   Physical Exam: Triage Vitals: Blood pressure 150/88, pulse 66, temperature 97.7 F (36.5 C), temperature source Oral, resp. rate 16, height 5' 1.75" (1.568 m), weight 152 lb 9.6 oz (69.219 kg), SpO2 98.00%., Body mass index is 28.15 kg/(m^2). General: Well developed, well nourished, in no acute distress. Head: Normocephalic, atraumatic, eyes without discharge, sclera non-icteric, nares are without discharge. Bilateral auditory canals clear, TM's are without perforation, pearly grey and translucent with reflective cone of light bilaterally. Oral cavity moist, posterior pharynx without exudate, erythema, peritonsillar abscess, or post nasal drip.  Neck: Supple. No thyromegaly. Full ROM. No lymphadenopathy. Lungs: Clear bilaterally to auscultation without wheezes, rales, or rhonchi. Breathing is unlabored. Heart: RRR with S1 S2. No murmurs, rubs, or gallops appreciated. Abdomen: Soft, non-tender, non-distended with normoactive bowel sounds. No hepatomegaly. No rebound/guarding. No obvious abdominal masses. Msk:  Strength and tone normal for age. Extremities/Skin: Warm and dry. No clubbing or cyanosis. No edema. 4 mm scaly nodule on L side of bridge of nose. Few ecchymotic areas corresponding to band of sock on L lower shin.  Neuro: Alert and oriented X 3. Moves all extremities spontaneously. Gait is normal. CNII-XII grossly in tact. Psych:  Responds to questions appropriately with a normal affect.   Left nasal lesion cryo'd  ASSESSMENT AND PLAN:  63 y.o. year old female with  1. Essential hypertension   2. Routine general medical examination at a health care facility   3. Screening for colon cancer   4. Keratosis   5. Screening for hyperlipidemia      I personally performed the services described in this documentation, which was scribed in my presence. The recorded infomation has been reviewed and is accurate. 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  Signed, Robyn Haber, MD 10/08/2014 9:07 AM   Results for orders placed in visit on 10/08/14  CBC WITH DIFFERENTIAL      Result Value Ref Range   WBC 8.1  4.0 - 10.5 K/uL   RBC 5.30 (*) 3.87 - 5.11 MIL/uL   Hemoglobin 16.8 (*) 12.0 - 15.0 g/dL   HCT 48.8 (*) 36.0 - 46.0 %   MCV 92.1  78.0 - 100.0 fL   MCH 31.7  26.0 - 34.0 pg   MCHC  34.4  30.0 - 36.0 g/dL   RDW 13.7  11.5 - 15.5 %   Platelets 402 (*) 150 - 400 K/uL   Neutrophils Relative % 53  43 - 77 %   Neutro Abs 4.3  1.7 - 7.7 K/uL   Lymphocytes Relative 31  12 - 46 %   Lymphs Abs 2.5  0.7 - 4.0 K/uL   Monocytes Relative 11  3 - 12 %   Monocytes Absolute 0.9  0.1 - 1.0 K/uL   Eosinophils Relative 4  0 - 5 %   Eosinophils Absolute 0.3  0.0 - 0.7 K/uL   Basophils Relative 1  0 - 1 %   Basophils Absolute 0.1  0.0 - 0.1 K/uL   Smear Review Criteria for review not met    COMPLETE METABOLIC PANEL WITH GFR      Result Value Ref Range   Sodium 137  135 - 145 mEq/L   Potassium 4.2  3.5 - 5.3 mEq/L   Chloride 99  96 - 112 mEq/L   CO2 26  19 - 32 mEq/L   Glucose, Bld 87  70 - 99 mg/dL   BUN 11  6 - 23 mg/dL   Creat 0.69  0.50 - 1.10 mg/dL   Total Bilirubin 0.7  0.2 - 1.2 mg/dL   Alkaline Phosphatase 73  39 - 117 U/L   AST 26  0 - 37 U/L   ALT 21  0 - 35 U/L   Total Protein 7.8  6.0 - 8.3 g/dL   Albumin 4.8  3.5 - 5.2 g/dL   Calcium 9.7  8.4 - 10.5 mg/dL   GFR, Est African American >89     GFR, Est Non  African American >89    LIPID PANEL      Result Value Ref Range   Cholesterol 239 (*) 0 - 200 mg/dL   Triglycerides 177 (*) <150 mg/dL   HDL 63  >39 mg/dL   Total CHOL/HDL Ratio 3.8     VLDL 35  0 - 40 mg/dL   LDL Cholesterol 141 (*) 0 - 99 mg/dL  POCT URINALYSIS DIPSTICK      Result Value Ref Range   Color, UA yellow     Clarity, UA clear     Glucose, UA neg     Bilirubin, UA neg     Ketones, UA neg     Spec Grav, UA 1.010     Blood, UA neg     pH, UA 7.0     Protein, UA neg     Urobilinogen, UA 0.2     Nitrite, UA neg     Leukocytes, UA Negative

## 2014-10-08 NOTE — Addendum Note (Signed)
Addended by: Elvina SidleLAUENSTEIN, Khara Renaud on: 10/08/2014 04:46 PM   Modules accepted: Orders, Level of Service

## 2014-10-31 ENCOUNTER — Other Ambulatory Visit: Payer: Self-pay | Admitting: Family Medicine

## 2014-12-17 ENCOUNTER — Encounter: Payer: Self-pay | Admitting: *Deleted

## 2015-02-25 ENCOUNTER — Telehealth: Payer: Self-pay

## 2015-02-25 MED ORDER — ZOSTER VACCINE LIVE 19400 UNT/0.65ML ~~LOC~~ SOLR: 0.6500 mL | Freq: Once | SUBCUTANEOUS | Status: DC

## 2015-02-25 NOTE — Telephone Encounter (Signed)
Pt's vaccine for shingles virus has expired, would like to know if she can get a new one. Please advise

## 2015-02-25 NOTE — Telephone Encounter (Signed)
Re-ordered. Rx printed.  Meds ordered this encounter  Medications  . zoster vaccine live, PF, (ZOSTAVAX) [id number] UNT/0.65ML injection    Sig: Inject 19,400 Units into the skin once.    Dispense:  1 each    Refill:  0    Order Specific Question:  Supervising Provider    Answer:  DOOLITTLE, ROBERT P [[id number]]

## 2015-02-26 NOTE — Telephone Encounter (Signed)
Rx faxed

## 2015-02-26 NOTE — Telephone Encounter (Signed)
Left vm letting pt know Rx was sent in.

## 2015-05-08 ENCOUNTER — Other Ambulatory Visit: Payer: Self-pay | Admitting: Family Medicine

## 2015-06-05 ENCOUNTER — Other Ambulatory Visit: Payer: Self-pay | Admitting: Family Medicine

## 2015-06-10 ENCOUNTER — Encounter: Payer: Self-pay | Admitting: Family Medicine

## 2015-06-10 ENCOUNTER — Ambulatory Visit (INDEPENDENT_AMBULATORY_CARE_PROVIDER_SITE_OTHER): Payer: PRIVATE HEALTH INSURANCE | Admitting: Family Medicine

## 2015-06-10 VITALS — BP 154/91 | HR 72 | Temp 98.1°F | Resp 16 | Ht 61.75 in | Wt 153.8 lb

## 2015-06-10 DIAGNOSIS — I10 Essential (primary) hypertension: Secondary | ICD-10-CM | POA: Diagnosis not present

## 2015-06-10 DIAGNOSIS — Z23 Encounter for immunization: Secondary | ICD-10-CM | POA: Diagnosis not present

## 2015-06-10 MED ORDER — ZOSTER VACCINE LIVE 19400 UNT/0.65ML ~~LOC~~ SOLR: 0.6500 mL | Freq: Once | SUBCUTANEOUS | Status: DC

## 2015-06-10 NOTE — Progress Notes (Signed)
Subjective:    Patient ID: Andrea Floyd, female    DOB: 01-07-1951, 64 y.o.   MRN: [id number]  HPI This Patient presents today for follow up of HTN. She checks her blood pressure several times a week at home and it has been consistently less than 140/90. 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 Dr. Milus Glazier suggested she start Lipitor; she is trying fish oil instead.   Would like pneumonia vaccine today and prescription for shingles vaccine. She had CPE with Dr. Milus Glazier 10/08/14.   Past Medical History  Diagnosis Date  . Hypertension   . Hypothyroidism     h/o yrs ago- no meds now   Past Surgical History  Procedure Laterality Date  . Cholecystectomy    . Back surgery    . Vaginal hysterectomy  08/20/2011    Procedure: HYSTERECTOMY VAGINAL;  Surgeon: Robley Fries;  Location: [facility];  Service: Gynecology;  Laterality: N/A;  With Removal Of Pessary.  . Anterior and posterior repair  08/20/2011    Procedure: ANTERIOR (CYSTOCELE) AND POSTERIOR REPAIR (RECTOCELE);  Surgeon: Robley Fries;  Location: [facility];  Service: Gynecology;  Laterality: Bilateral;  With Cystoscopy  . Bladder suspension  08/20/2011    Procedure: TRANSVAGINAL TAPE (TVT) PROCEDURE;  Surgeon: Robley Fries;  Location: [facility];  Service: Gynecology;  Laterality: Bilateral;  . Abdominal hysterectomy     Family History  Problem Relation Age of Onset  . Hypertension Mother   . Alzheimer's disease Mother   . Diabetes Father    History  Substance Use Topics  . Smoking status: Current Every Day Smoker -- 0.25 packs/day    Types: Cigarettes  . Smokeless tobacco: Not on file  . Alcohol Use: 0.0 oz/week    1-2 Glasses of wine per week     Comment: daily   Medications, allergies, past medical history, surgical history, family history, social history and problem list reviewed and updated.   Review of Systems No chest pain, no SOB, occasional swelling in feet/ankles worse in the left.       Objective:   Physical Exam Physical Exam  Constitutional: Oriented to person, place, and time. She appears well-developed and well-nourished.  HENT:  Head: Normocephalic and atraumatic.  Eyes: Conjunctivae are normal.  Neck: Normal range of motion. Neck supple.  Cardiovascular: Normal rate, regular rhythm and normal heart sounds.   Pulmonary/Chest: Effort normal and breath sounds normal.  Musculoskeletal: Normal range of motion.  Neurological: Alert and oriented to person, place, and time.  Skin: Skin is warm and dry.  Psychiatric: Normal mood and affect. Behavior is normal. Judgment and thought content normal.  Vitals reviewed. BP 154/91 mmHg  Pulse 72  Temp(Src) 98.1 F (36.7 C) (Oral)  Resp 16  Ht 5' 1.75" (1.568 m)  Wt 153 lb 12.8 oz (69.763 kg)  BMI 28.37 kg/m2  SpO2 96%     Assessment & Plan:  1. Need for prophylactic vaccination with Streptococcus pneumoniae (Pneumococcus) and Influenza vaccines - Pneumococcal conjugate vaccine 13-valent IM  2. Need for shingles vaccine - zoster vaccine live, PF, (ZOSTAVAX) [id number] UNT/0.65ML injection; Inject 19,400 Units into the skin once.  Dispense: 1 each; Refill: 0  3. Essential hypertension - patient reports normal readings at home. She will continue to monitor at home and bring in readings to her next visit.   - follow up CPE in 4-5 months  Andrea Ree, FNP-BC  [facility], [facility]  06/13/2015 8:00 AM

## 2015-06-28 ENCOUNTER — Telehealth: Payer: Self-pay | Admitting: Family Medicine

## 2015-06-28 NOTE — Telephone Encounter (Signed)
lmom to call and reschedule appt with Andrea Floyd

## 2015-08-06 ENCOUNTER — Other Ambulatory Visit: Payer: Self-pay | Admitting: Physician Assistant

## 2015-09-03 ENCOUNTER — Other Ambulatory Visit: Payer: Self-pay | Admitting: Physician Assistant

## 2015-10-01 ENCOUNTER — Ambulatory Visit (INDEPENDENT_AMBULATORY_CARE_PROVIDER_SITE_OTHER): Payer: PRIVATE HEALTH INSURANCE | Admitting: Family Medicine

## 2015-10-01 ENCOUNTER — Encounter: Payer: Self-pay | Admitting: Family Medicine

## 2015-10-01 VITALS — BP 153/99 | HR 69 | Temp 98.1°F | Resp 16 | Ht 61.5 in | Wt 156.0 lb

## 2015-10-01 DIAGNOSIS — Z23 Encounter for immunization: Secondary | ICD-10-CM | POA: Diagnosis not present

## 2015-10-01 DIAGNOSIS — Z4802 Encounter for removal of sutures: Secondary | ICD-10-CM | POA: Diagnosis not present

## 2015-10-01 DIAGNOSIS — N3090 Cystitis, unspecified without hematuria: Secondary | ICD-10-CM | POA: Diagnosis not present

## 2015-10-01 DIAGNOSIS — Z1389 Encounter for screening for other disorder: Secondary | ICD-10-CM

## 2015-10-01 DIAGNOSIS — I10 Essential (primary) hypertension: Secondary | ICD-10-CM

## 2015-10-01 DIAGNOSIS — Z0000 Encounter for general adult medical examination without abnormal findings: Secondary | ICD-10-CM | POA: Diagnosis not present

## 2015-10-01 DIAGNOSIS — Z139 Encounter for screening, unspecified: Secondary | ICD-10-CM | POA: Diagnosis not present

## 2015-10-01 DIAGNOSIS — E663 Overweight: Secondary | ICD-10-CM

## 2015-10-01 LAB — POCT URINALYSIS DIP (MANUAL ENTRY)
Bilirubin, UA: NEGATIVE
Glucose, UA: NEGATIVE
Ketones, POC UA: NEGATIVE
Nitrite, UA: NEGATIVE
PH UA: 6.5
PROTEIN UA: NEGATIVE
RBC UA: NEGATIVE
SPEC GRAV UA: 1.02
UROBILINOGEN UA: 0.2

## 2015-10-01 LAB — CBC
HEMATOCRIT: 49.3 % — AB (ref 36.0–46.0)
HEMOGLOBIN: 17.1 g/dL — AB (ref 12.0–15.0)
MCH: 32.6 pg (ref 26.0–34.0)
MCHC: 34.7 g/dL (ref 30.0–36.0)
MCV: 93.9 fL (ref 78.0–100.0)
MPV: 10.3 fL (ref 8.6–12.4)
Platelets: 378 10*3/uL (ref 150–400)
RBC: 5.25 MIL/uL — AB (ref 3.87–5.11)
RDW: 13.9 % (ref 11.5–15.5)
WBC: 6.8 10*3/uL (ref 4.0–10.5)

## 2015-10-01 LAB — COMPREHENSIVE METABOLIC PANEL
ALT: 29 U/L (ref 6–29)
AST: 28 U/L (ref 10–35)
Albumin: 4.4 g/dL (ref 3.6–5.1)
Alkaline Phosphatase: 67 U/L (ref 33–130)
BUN: 11 mg/dL (ref 7–25)
CHLORIDE: 100 mmol/L (ref 98–110)
CO2: 26 mmol/L (ref 20–31)
CREATININE: 0.76 mg/dL (ref 0.50–0.99)
Calcium: 9.4 mg/dL (ref 8.6–10.4)
Glucose, Bld: 87 mg/dL (ref 65–99)
Potassium: 4.5 mmol/L (ref 3.5–5.3)
SODIUM: 138 mmol/L (ref 135–146)
Total Bilirubin: 0.8 mg/dL (ref 0.2–1.2)
Total Protein: 7.1 g/dL (ref 6.1–8.1)

## 2015-10-01 LAB — LIPID PANEL
Cholesterol: 223 mg/dL — ABNORMAL HIGH (ref 125–200)
HDL: 61 mg/dL (ref >=46)
LDL CALC: 132 mg/dL — AB (ref <130)
Total CHOL/HDL Ratio: 3.7 Ratio (ref <=5.0)
Triglycerides: 149 mg/dL (ref <150)
VLDL: 30 mg/dL (ref <30)

## 2015-10-01 LAB — TSH: TSH: 4.152 u[IU]/mL (ref 0.350–4.500)

## 2015-10-01 MED ORDER — HYDROCHLOROTHIAZIDE 25 MG PO TABS: 25.0000 mg | ORAL_TABLET | Freq: Every day | ORAL | Status: DC

## 2015-10-01 MED ORDER — ZOSTER VACCINE LIVE 19400 UNT/0.65ML ~~LOC~~ SOLR: 0.6500 mL | Freq: Once | SUBCUTANEOUS | Status: DC

## 2015-10-01 NOTE — Patient Instructions (Addendum)
Enjoy your retirement Consider adding some gentle yoga to your wellness routine Manage stress Limit alcohol to 7 drinks per week Consider smoking cessation even though you don't smoke very much Try 1/2 tablet of HCTZ and check blood pressure a couple of times a week Touch base with me via mychart in about a month to let me know how your blood pressure is doing     Why follow it? Research shows. . Those who follow the Mediterranean diet have a reduced risk of heart disease  . The diet is associated with a reduced incidence of Parkinson's and Alzheimer's diseases . People following the diet may have longer life expectancies and lower rates of chronic diseases  . The Dietary Guidelines for Americans recommends the Mediterranean diet as an eating plan to promote health and prevent disease  What Is the Mediterranean Diet?  . Healthy eating plan based on typical foods and recipes of Mediterranean-style cooking . The diet is primarily a plant based diet; these foods should make up a majority of meals   Starches - Plant based foods should make up a majority of meals - They are an important sources of vitamins, minerals, energy, antioxidants, and fiber - Choose whole grains, foods high in fiber and minimally processed items  - Typical grain sources include wheat, oats, barley, corn, brown rice, bulgar, farro, millet, polenta, couscous  - Various types of beans include chickpeas, lentils, fava beans, black beans, white beans   Fruits  Veggies - Large quantities of antioxidant rich fruits & veggies; 6 or more servings  - Vegetables can be eaten raw or lightly drizzled with oil and cooked  - Vegetables common to the traditional Mediterranean Diet include: artichokes, arugula, beets, broccoli, brussel sprouts, cabbage, carrots, celery, collard greens, cucumbers, eggplant, kale, leeks, lemons, lettuce, mushrooms, okra, onions, peas, peppers, potatoes, pumpkin, radishes, rutabaga, shallots, spinach, sweet  potatoes, turnips, zucchini - Fruits common to the Mediterranean Diet include: apples, apricots, avocados, cherries, clementines, dates, figs, grapefruits, grapes, melons, nectarines, oranges, peaches, pears, pomegranates, strawberries, tangerines  Fats - Replace butter and margarine with healthy oils, such as olive oil, canola oil, and tahini  - Limit nuts to no more than a handful a day  - Nuts include walnuts, almonds, pecans, pistachios, pine nuts  - Limit or avoid candied, honey roasted or heavily salted nuts - Olives are central to the [employer] - can be eaten whole or used in a variety of dishes   Meats Protein - Limiting red meat: no more than a few times a month - When eating red meat: choose lean cuts and keep the portion to the size of deck of cards - Eggs: approx. 0 to 4 times a week  - Fish and lean poultry: at least 2 a week  - Healthy protein sources include, chicken, [place], lean beef, lamb - Increase intake of seafood such as tuna, salmon, trout, mackerel, shrimp, scallops - Avoid or limit high fat processed meats such as sausage and bacon  Dairy - Include moderate amounts of low fat dairy products  - Focus on healthy dairy such as fat free yogurt, skim milk, low or reduced fat cheese - Limit dairy products higher in fat such as whole or 2% milk, cheese, ice cream  Alcohol - Moderate amounts of red wine is ok  - No more than 5 oz daily for women (all ages) and men older than age 33  - No more than 10 oz of wine daily for men younger  than 65  Other - Limit sweets and other desserts  - Use herbs and spices instead of salt to flavor foods  - Herbs and spices common to the traditional Mediterranean Diet include: basil, bay leaves, chives, cloves, cumin, fennel, garlic, lavender, marjoram, mint, oregano, parsley, pepper, rosemary, sage, savory, sumac, tarragon, thyme   It's not just a diet, it's a lifestyle:  . The Mediterranean diet includes lifestyle factors typical of  those in the region  . Foods, drinks and meals are best eaten with others and savored . Daily physical activity is important for overall good health . This could be strenuous exercise like running and aerobics . This could also be more leisurely activities such as walking, housework, yard-work, or taking the stairs . Moderation is the key; a balanced and healthy diet accommodates most foods and drinks . Consider portion sizes and frequency of consumption of certain foods   Meal Ideas & Options:  . Breakfast:  o Whole wheat toast or whole wheat English muffins with peanut butter & hard boiled egg o Steel cut oats topped with apples & cinnamon and skim milk  o Fresh fruit: banana, strawberries, melon, berries, peaches  o Smoothies: strawberries, bananas, greek yogurt, peanut butter o Low fat greek yogurt with blueberries and granola  o Egg white omelet with spinach and mushrooms o Breakfast couscous: whole wheat couscous, apricots, skim milk, cranberries  . Sandwiches:  o Hummus and grilled vegetables (peppers, zucchini, squash) on whole wheat bread   o Grilled chicken on whole wheat pita with lettuce, tomatoes, cucumbers or tzatziki  o Tuna salad on whole wheat bread: tuna salad made with greek yogurt, olives, red peppers, capers, green onions o Garlic rosemary lamb pita: lamb sauted with garlic, rosemary, salt & pepper; add lettuce, cucumber, greek yogurt to pita - flavor with lemon juice and black pepper  . Seafood:  o Mediterranean grilled salmon, seasoned with garlic, basil, parsley, lemon juice and black pepper o Shrimp, lemon, and spinach whole-grain pasta salad made with low fat greek yogurt  o Seared scallops with lemon orzo  o Seared tuna steaks seasoned salt, pepper, coriander topped with tomato mixture of olives, tomatoes, olive oil, minced garlic, parsley, green onions and cappers  . Meats:  o Herbed greek chicken salad with kalamata olives, cucumber, feta  o Red bell peppers  stuffed with spinach, bulgur, lean ground beef (or lentils) & topped with feta   o Kebabs: skewers of chicken, tomatoes, onions, zucchini, squash  o [place] burgers: made with red onions, mint, dill, lemon juice, feta cheese topped with roasted red peppers . Vegetarian o Cucumber salad: cucumbers, artichoke hearts, celery, red onion, feta cheese, tossed in olive oil & lemon juice  o Hummus and whole grain pita points with a greek salad (lettuce, tomato, feta, olives, cucumbers, red onion) o Lentil soup with celery, carrots made with vegetable broth, garlic, salt and pepper  o Tabouli salad: parsley, bulgur, mint, scallions, cucumbers, tomato, radishes, lemon juice, olive oil, salt and pepper.

## 2015-10-01 NOTE — Progress Notes (Signed)
Subjective:    Patient ID: Andrea Floyd, female    DOB: 01-01-1951, 63 y.o.   MRN: [id number]  HPI This is pleasant 64 yo female who presents for CPE. She will be retiring next month. She is looking forward to working on projects in her house.   Last CPE- 2012 Mammo- 3/16 Pap- 2012, hysterectomy Colonoscopy- 4/16- recall at 5 years Tdap- 10/08 Flu- annual Eye- regular Dental- regular Exercise- walking  Past Medical History  Diagnosis Date  . Hypertension   . Hypothyroidism     h/o yrs ago- no meds now   Past Surgical History  Procedure Laterality Date  . Cholecystectomy    . Back surgery    . Vaginal hysterectomy  08/20/2011    Procedure: HYSTERECTOMY VAGINAL;  Surgeon: Robley Fries;  Location: [facility];  Service: Gynecology;  Laterality: N/A;  With Removal Of Pessary.  . Anterior and posterior repair  08/20/2011    Procedure: ANTERIOR (CYSTOCELE) AND POSTERIOR REPAIR (RECTOCELE);  Surgeon: Robley Fries;  Location: [facility];  Service: Gynecology;  Laterality: Bilateral;  With Cystoscopy  . Bladder suspension  08/20/2011    Procedure: TRANSVAGINAL TAPE (TVT) PROCEDURE;  Surgeon: Robley Fries;  Location: [facility];  Service: Gynecology;  Laterality: Bilateral;  . Abdominal hysterectomy     Family History  Problem Relation Age of Onset  . Hypertension Mother   . Alzheimer's disease Mother   . Diabetes Father    Social History  Substance Use Topics  . Smoking status: Current Every Day Smoker -- 0.25 packs/day    Types: Cigarettes  . Smokeless tobacco: None  . Alcohol Use: 0.0 oz/week    1-2 Glasses of wine per week     Comment: daily    Review of Systems  Constitutional: Negative.   Eyes: Negative.   Respiratory: Negative.   Cardiovascular: Positive for leg swelling (occasional).  Gastrointestinal: Negative.   Endocrine: Negative.   Genitourinary: Negative.   Musculoskeletal: Positive for arthralgias (occasional hip ).  Skin: Negative.     Allergic/Immunologic: Negative.   Neurological: Positive for headaches (aura, rare headache, relieved with caffeine, dark room, B12.).  Hematological: Negative.   Psychiatric/Behavioral: Negative.       Objective:   Physical Exam Physical Exam  Constitutional: She is oriented to person, place, and time. She appears well-developed and well-nourished. No distress.  HENT:  Head: Normocephalic and atraumatic.  Right Ear: External ear normal.  Left Ear: External ear normal.  Nose: Nose normal.  Mouth/Throat: Oropharynx is clear and moist. No oropharyngeal exudate.  Eyes: Conjunctivae are normal. Pupils are equal, round, and reactive to light.  Neck: Normal range of motion. Neck supple. No JVD present. No thyromegaly present.  Cardiovascular: Normal rate, regular rhythm, normal heart sounds and intact distal pulses.   Pulmonary/Chest: Effort normal and breath sounds normal. Right breast exhibits no inverted nipple, no mass, no nipple discharge, no skin change and no tenderness. Left breast exhibits no inverted nipple, no mass, no nipple discharge, no skin change and no tenderness. Breasts are symmetrical.  Abdominal: Soft. Bowel sounds are normal. She exhibits no distension and no mass. There is no tenderness. There is no rebound and no guarding.  Genitourinary: Vagina normal. Pelvic exam was performed with patient supine. There is no rash, tenderness, lesion or injury on the right labia. There is no rash, tenderness, lesion or injury on the left labia. Cervix surgically absent. No vaginal discharge found.  Musculoskeletal: Normal range of motion. She exhibits  no edema or tenderness.  Lymphadenopathy:    She has no cervical adenopathy.  Neurological: She is alert and oriented to person, place, and time. She has normal reflexes.  Skin: Skin is warm and dry. She is not diaphoretic.  Psychiatric: She has a normal mood and affect. Her behavior is normal. Judgment and thought content normal.   Vitals reviewed.  BP 153/99 mmHg  Pulse 69  Temp(Src) 98.1 F (36.7 C) (Oral)  Resp 16  Ht 5' 1.5" (1.562 m)  Wt 156 lb (70.761 kg)  BMI 29.00 kg/m2 Wt Readings from Last 3 Encounters:  10/01/15 156 lb (70.761 kg)  06/10/15 153 lb 12.8 oz (69.763 kg)  10/08/14 152 lb 9.6 oz (69.219 kg)      Assessment & Plan:  1. Annual physical exam - encouraged healthy habits and regular routine, especially with retirement  2. Essential hypertension - BP has been elevated last 2 visits, will add HCTZ and patient will monitor BP - hydrochlorothiazide (HYDRODIURIL) 25 MG tablet; Take 1 tablet (25 mg total) by mouth daily.  Dispense: 90 tablet; Refill: 3 - CBC - Comprehensive metabolic panel  3. Overweight (BMI 25.0-29.9) - Comprehensive metabolic panel - Lipid panel - Vit D  25 hydroxy (rtn osteoporosis monitoring) - TSH  4. Need for shingles vaccine - zoster vaccine live, PF, (ZOSTAVAX) 65784 UNT/0.65ML injection; Inject 19,400 Units into the skin once.  Dispense: 1 each; Refill: 0  5. Screening for hematuria or proteinuria - POCT urinalysis dipstick  6. Cystitis - Urine culture   Olean Ree, FNP-BC  [facility], [place] [facility]  10/03/2015 7:52 PM

## 2015-10-02 ENCOUNTER — Other Ambulatory Visit: Payer: Self-pay | Admitting: Family Medicine

## 2015-10-02 DIAGNOSIS — N3090 Cystitis, unspecified without hematuria: Secondary | ICD-10-CM

## 2015-10-02 LAB — VITAMIN D 25 HYDROXY (VIT D DEFICIENCY, FRACTURES): Vit D, 25-Hydroxy: 53 ng/mL (ref 30–100)

## 2015-10-03 ENCOUNTER — Other Ambulatory Visit: Payer: Self-pay | Admitting: Family Medicine

## 2015-10-03 LAB — URINE CULTURE

## 2015-10-07 ENCOUNTER — Encounter: Payer: Self-pay | Admitting: Family Medicine

## 2015-10-09 ENCOUNTER — Other Ambulatory Visit: Payer: Self-pay | Admitting: Family Medicine

## 2015-10-09 DIAGNOSIS — D582 Other hemoglobinopathies: Secondary | ICD-10-CM

## 2015-10-10 ENCOUNTER — Ambulatory Visit: Payer: Self-pay

## 2015-10-10 NOTE — Addendum Note (Signed)
Addended by: Johnnette LitterARDWELL, Kaisy Severino M on: 10/10/2015 08:56 AM   Modules accepted: Kipp BroodSmartSet

## 2015-10-14 ENCOUNTER — Encounter: Payer: PRIVATE HEALTH INSURANCE | Admitting: Family Medicine

## 2015-12-02 ENCOUNTER — Other Ambulatory Visit: Payer: Self-pay | Admitting: Physician Assistant

## 2016-03-31 ENCOUNTER — Ambulatory Visit (INDEPENDENT_AMBULATORY_CARE_PROVIDER_SITE_OTHER): Payer: Medicare HMO | Admitting: Family Medicine

## 2016-03-31 ENCOUNTER — Encounter: Payer: Self-pay | Admitting: Family Medicine

## 2016-03-31 VITALS — BP 138/86 | HR 61 | Temp 97.9°F | Resp 16 | Ht 62.0 in | Wt 157.0 lb

## 2016-03-31 DIAGNOSIS — L259 Unspecified contact dermatitis, unspecified cause: Secondary | ICD-10-CM

## 2016-03-31 DIAGNOSIS — I10 Essential (primary) hypertension: Secondary | ICD-10-CM

## 2016-03-31 DIAGNOSIS — D582 Other hemoglobinopathies: Secondary | ICD-10-CM | POA: Diagnosis not present

## 2016-03-31 LAB — BASIC METABOLIC PANEL
BUN: 13 mg/dL (ref 7–25)
CALCIUM: 9.6 mg/dL (ref 8.6–10.4)
CO2: 24 mmol/L (ref 20–31)
Chloride: 100 mmol/L (ref 98–110)
Creat: 0.66 mg/dL (ref 0.50–0.99)
GLUCOSE: 96 mg/dL (ref 65–99)
POTASSIUM: 4.3 mmol/L (ref 3.5–5.3)
SODIUM: 137 mmol/L (ref 135–146)

## 2016-03-31 NOTE — Patient Instructions (Addendum)
  Schedule your welcome to medicare physical for October in September.    IF you received an x-ray today, you will receive an invoice from [facility]. Please contact [facility] at [phone] with questions or concerns regarding your invoice.   IF you received labwork today, you will receive an invoice from [facility]. Please contact [facility] at [phone] with questions or concerns regarding your invoice.   Our billing staff will not be able to assist you with questions regarding bills from these companies.  You will be contacted with the lab results as soon as they are available. The fastest way to get your results is to activate your My Chart account. Instructions are located on the last page of this paperwork. If you have not heard from us regarding the results in 2 weeks, please contact this office.

## 2016-03-31 NOTE — Progress Notes (Signed)
Subjective:    Patient ID: Andrea Floyd, female    DOB: 02/22/1951, 65 y.o.   MRN: [id number]  HPI This is a pleasant 65 yo female who presents today for f/u of HTN, elevated hgb. She has been doing well and enjoying her retirement. 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 She is walking. Smoking 2 cigarettes daily. No side effects with medication.   Has had red, itchy rash on forearms for 2 weeks. Thinks this is relate to her dog mouthing her. Has been using calamine with some relief.   Past Medical History  Diagnosis Date  . Hypertension   . Hypothyroidism     h/o yrs ago- no meds now   Past Surgical History  Procedure Laterality Date  . Cholecystectomy    . Back surgery    . Vaginal hysterectomy  08/20/2011    Procedure: HYSTERECTOMY VAGINAL;  Surgeon: Robley FriesVaishali R Mody;  Location: [facility];  Service: Gynecology;  Laterality: N/A;  With Removal Of Pessary.  . Anterior and posterior repair  08/20/2011    Procedure: ANTERIOR (CYSTOCELE) AND POSTERIOR REPAIR (RECTOCELE);  Surgeon: Robley FriesVaishali R Mody;  Location: [facility];  Service: Gynecology;  Laterality: Bilateral;  With Cystoscopy  . Bladder suspension  08/20/2011    Procedure: TRANSVAGINAL TAPE (TVT) PROCEDURE;  Surgeon: Robley FriesVaishali R Mody;  Location: [facility];  Service: Gynecology;  Laterality: Bilateral;  . Abdominal hysterectomy     Family History  Problem Relation Age of Onset  . Hypertension Mother   . Alzheimer's disease Mother   . Diabetes Father    Social History  Substance Use Topics  . Smoking status: Current Every Day Smoker -- 0.25 packs/day    Types: Cigarettes  . Smokeless tobacco: None  . Alcohol Use: 0.0 oz/week    1-2 Glasses of wine per week     Comment: daily      Review of Systems  Respiratory: Negative for cough and shortness of breath.   Cardiovascular: Negative for chest pain and leg swelling.  Skin: Positive for rash.       Objective:   Physical  Exam Physical Exam  Constitutional: Oriented to person, place, and time. She appears well-developed and well-nourished.  HENT:  Head: Normocephalic and atraumatic.  Eyes: Conjunctivae are normal.  Neck: Normal range of motion. Neck supple.  Cardiovascular: Normal rate, regular rhythm and normal heart sounds.   Pulmonary/Chest: Effort normal and breath sounds normal.  Musculoskeletal: Normal range of motion.  Neurological: Alert and oriented to person, place, and time.  Skin: Skin is warm and dry. Bilateral forearms with scattered, erythematous macules.  Psychiatric: Normal mood and affect. Behavior is normal. Judgment and thought content normal.  Vitals reviewed.     BP 138/86 mmHg  Pulse 61  Temp(Src) 97.9 F (36.6 C)  Resp 16  Ht 5\' 2"  (1.575 m)  Wt 157 lb (71.215 kg)  BMI 28.71 kg/m2 Wt Readings from Last 3 Encounters:  03/31/16 157 lb (71.215 kg)  10/01/15 156 lb (70.761 kg)  06/10/15 153 lb 12.8 oz (69.763 kg)       Assessment & Plan:  1. Essential hypertension - Basic metabolic panel  2. 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 Contact dermatitis - she has some steroid cream at home, she was instructed to try this twice a day for up to 10 days - RTC if worsening or if no improvement  -  follow up in 6 months   Olean Ree, FNP-BC  [facility], [facility]  04/04/2016 8:19 AM

## 2016-04-02 LAB — ERYTHROPOIETIN: ERYTHROPOIETIN: 3.9 m[IU]/mL (ref 2.6–18.5)

## 2016-04-04 LAB — JAK2 MUT. V617F QUANT, LEUMETA: JAK2 Mut,QN,Leumeta: NEGATIVE pg/uL

## 2016-05-22 ENCOUNTER — Other Ambulatory Visit: Payer: Self-pay | Admitting: Family Medicine

## 2016-07-03 ENCOUNTER — Encounter: Payer: Self-pay | Admitting: Family Medicine

## 2016-07-03 ENCOUNTER — Ambulatory Visit (INDEPENDENT_AMBULATORY_CARE_PROVIDER_SITE_OTHER): Payer: Medicare HMO | Admitting: Family Medicine

## 2016-07-03 VITALS — BP 143/90 | HR 70 | Ht 61.5 in | Wt 157.8 lb

## 2016-07-03 DIAGNOSIS — F17200 Nicotine dependence, unspecified, uncomplicated: Secondary | ICD-10-CM | POA: Diagnosis not present

## 2016-07-03 DIAGNOSIS — Z1159 Encounter for screening for other viral diseases: Secondary | ICD-10-CM

## 2016-07-03 DIAGNOSIS — Z789 Other specified health status: Secondary | ICD-10-CM

## 2016-07-03 DIAGNOSIS — Z23 Encounter for immunization: Secondary | ICD-10-CM

## 2016-07-03 DIAGNOSIS — Z8639 Personal history of other endocrine, nutritional and metabolic disease: Secondary | ICD-10-CM | POA: Diagnosis not present

## 2016-07-03 DIAGNOSIS — E539 Vitamin B deficiency, unspecified: Secondary | ICD-10-CM

## 2016-07-03 DIAGNOSIS — Z7289 Other problems related to lifestyle: Secondary | ICD-10-CM

## 2016-07-03 DIAGNOSIS — E559 Vitamin D deficiency, unspecified: Secondary | ICD-10-CM

## 2016-07-03 DIAGNOSIS — E663 Overweight: Secondary | ICD-10-CM

## 2016-07-03 DIAGNOSIS — Z90710 Acquired absence of both cervix and uterus: Secondary | ICD-10-CM

## 2016-07-03 DIAGNOSIS — I10 Essential (primary) hypertension: Secondary | ICD-10-CM

## 2016-07-03 HISTORY — DX: Other specified health status: Z78.9

## 2016-07-03 HISTORY — DX: Nicotine dependence, unspecified, uncomplicated: F17.200

## 2016-07-03 HISTORY — DX: Vitamin D deficiency, unspecified: E55.9

## 2016-07-03 NOTE — Assessment & Plan Note (Signed)
Asked patient to please continue to monitor blood pressure at home. Her goal blood pressure is less than 150/90 on a regular basis, and preferably less than 140/80.  Weight loss, low salt and Mediterranean diet discussed with patient.

## 2016-07-03 NOTE — Assessment & Plan Note (Signed)
Advised patient to cut back at least in half and keep to 1, at MAX 2 per day. Discussed that this would help with weight loss and likely improve memory.

## 2016-07-03 NOTE — Patient Instructions (Addendum)
OCt for your welcome to Medicare PE including fasting Bld work. Sooner if problems/ concerns.    Smoking Cessation, Tips for Success If you are ready to quit smoking, congratulations! You have chosen to help yourself be healthier. Cigarettes bring nicotine, tar, carbon monoxide, and other irritants into your body. Your lungs, heart, and blood vessels will be able to work better without these poisons. There are many different ways to quit smoking. Nicotine gum, nicotine patches, a nicotine inhaler, or nicotine nasal spray can help with physical craving. Hypnosis, support groups, and medicines help break the habit of smoking. WHAT THINGS CAN I DO TO MAKE QUITTING EASIER?  Here are some tips to help you quit for good:  Pick a date when you will quit smoking completely. Tell all of your friends and family about your plan to quit on that date.  Do not try to slowly cut down on the number of cigarettes you are smoking. Pick a quit date and quit smoking completely starting on that day.  Throw away all cigarettes.   Clean and remove all ashtrays from your home, work, and car.  On a card, write down your reasons for quitting. Carry the card with you and read it when you get the urge to smoke.  Cleanse your body of nicotine. Drink enough water and fluids to keep your urine clear or pale yellow. Do this after quitting to flush the nicotine from your body.  Learn to predict your moods. Do not let a bad situation be your excuse to have a cigarette. Some situations in your life might tempt you into wanting a cigarette.  Never have "just one" cigarette. It leads to wanting another and another. Remind yourself of your decision to quit.  Change habits associated with smoking. If you smoked while driving or when feeling stressed, try other activities to replace smoking. Stand up when drinking your coffee. Brush your teeth after eating. Sit in a different chair when you read the paper. Avoid alcohol while  trying to quit, and try to drink fewer caffeinated beverages. Alcohol and caffeine may urge you to smoke.  Avoid foods and drinks that can trigger a desire to smoke, such as sugary or spicy foods and alcohol.  Ask people who smoke not to smoke around you.  Have something planned to do right after eating or having a cup of coffee. For example, plan to take a walk or exercise.  Try a relaxation exercise to calm you down and decrease your stress. Remember, you may be tense and nervous for the first 2 weeks after you quit, but this will pass.  Find new activities to keep your hands busy. Play with a pen, coin, or rubber band. Doodle or draw things on paper.  Brush your teeth right after eating. This will help cut down on the craving for the taste of tobacco after meals. You can also try mouthwash.   Use oral substitutes in place of cigarettes. Try using lemon drops, carrots, cinnamon sticks, or chewing gum. Keep them handy so they are available when you have the urge to smoke.  When you have the urge to smoke, try deep breathing.  Designate your home as a nonsmoking area.  If you are a heavy smoker, ask your health care provider about a prescription for nicotine chewing gum. It can ease your withdrawal from nicotine.  Reward yourself. Set aside the cigarette money you save and buy yourself something nice.  Look for support from others. Join a support  group or smoking cessation program. Ask someone at home or at work to help you with your plan to quit smoking.  Always ask yourself, "Do I need this cigarette or is this just a reflex?" Tell yourself, "Today, I choose not to smoke," or "I do not want to smoke." You are reminding yourself of your decision to quit.  Do not replace cigarette smoking with electronic cigarettes (commonly called e-cigarettes). The safety of e-cigarettes is unknown, and some may contain harmful chemicals.  If you relapse, do not give up! Plan ahead and think about  what you will do the next time you get the urge to smoke. HOW WILL I FEEL WHEN I QUIT SMOKING? You may have symptoms of withdrawal because your body is used to nicotine (the addictive substance in cigarettes). You may crave cigarettes, be irritable, feel very hungry, cough often, get headaches, or have difficulty concentrating. The withdrawal symptoms are only temporary. They are strongest when you first quit but will go away within 10-14 days. When withdrawal symptoms occur, stay in control. Think about your reasons for quitting. Remind yourself that these are signs that your body is healing and getting used to being without cigarettes. Remember that withdrawal symptoms are easier to treat than the major diseases that smoking can cause.  Even after the withdrawal is over, expect periodic urges to smoke. However, these cravings are generally short lived and will go away whether you smoke or not. Do not smoke! WHAT RESOURCES ARE AVAILABLE TO HELP ME QUIT SMOKING? Your health care provider can direct you to community resources or hospitals for support, which may include:  Group support.  Education.  Hypnosis.  Therapy.   This information is not intended to replace advice given to you by your health care provider. Make sure you discuss any questions you have with your health care provider.   Document Released: 09/11/2004 Document Revised: 01/04/2015 Document Reviewed: 06/01/2013 Elsevier Interactive Patient Education [employer].

## 2016-07-03 NOTE — Assessment & Plan Note (Addendum)
Routine counseling done; advised to walk at least start off at 10-15 minutes per day and work her way up to 30 or more. Advised she follow dash and or Mediterranean diet. 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

## 2016-07-03 NOTE — Progress Notes (Signed)
Andrea Floyd, D.O. Primary care at [facility]   Subjective:    Chief Complaint  Patient presents with  . Establish Care   New pt, here to establish care.   HPI: Andrea Floyd is a pleasant 65 y.o. female who presents to [facility] at [facility] today To establish care  Patient[facility] PCP prior [facility] on [address].   Social history: Patient retired in November 2016. 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  She has been married for 47 years. They have 4 kids and 4 grandchildren. She has lived here [place] since 1996 when her and her husband moved from [place]. Smoked for 30-35 yrs 2-4 cigarettes /day.  Has no desire to quit and knows it's bad for her.   Drinks White wine- about 3/d, a little more on weekends- understands this is an access to what is recommended  HTN- 15 yrs, patient is a Engineer, civil (consulting) and occasionally checks Bp at home - stays pretty consistent around  135-140/ 85-90.  She has been on lisinopril the past which caused cough, by systolic which did not work well and patient went into a hypertensive crisis, and hydrochlorothiazide which did not work at all per patient.  No regular exercise, average diet  Has known vitamin D insufficiency, takes supplement  Known vitamin B12 insufficiency, takes supplements.     Past Medical History  Diagnosis Date  . Hypertension   . Tobacco use disorder 07/03/2016  . 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 SUI (stress urinary incontinence, female) 08/20/2011  . Vitamin D insufficiency 07/03/2016      Past Surgical History  Procedure Laterality Date  . Cholecystectomy    . Back surgery    . Vaginal hysterectomy  08/20/2011    Procedure: HYSTERECTOMY VAGINAL;  Surgeon: Robley Fries;  Location: [facility];  Service: Gynecology;  Laterality: N/A;  With Removal Of Pessary.  . Anterior and posterior repair  08/20/2011    Procedure: ANTERIOR (CYSTOCELE) AND  POSTERIOR REPAIR (RECTOCELE);  Surgeon: Robley Fries;  Location: [facility];  Service: Gynecology;  Laterality: Bilateral;  With Cystoscopy  . Bladder suspension  08/20/2011    Procedure: TRANSVAGINAL TAPE (TVT) PROCEDURE;  Surgeon: Robley Fries;  Location: [facility];  Service: Gynecology;  Laterality: Bilateral;  . Abdominal hysterectomy    . Tonsillectomy        Family History  Problem Relation Age of Onset  . Hypertension Mother   . Alzheimer's disease Mother   . Diabetes Father   . Hypertension Father   . Cancer Sister     colon  . Hypertension Brother   . Diabetes Son   . Hypertension Brother   . Hypertension Brother       History  Drug Use No  ,    History  Alcohol Use  . 0.0 oz/week  . 1-2 Glasses of wine per week    Comment: daily  ,    History  Smoking status  . Current Every Day Smoker -- 0.25 packs/day  . Types: Cigarettes  Smokeless tobacco  . Never Used  ,     History  Sexual Activity  . Sexual Activity: Yes      Patient's Medications  New Prescriptions   No medications on file  Previous Medications   AMLODIPINE (NORVASC) 10 MG TABLET    TAKE 1 TABLET BY MOUTH DAILY  ASPIRIN 81 MG TABLET    Take 81 mg by mouth daily.   B COMPLEX-C-E-ZN (B COMPLEX-C-E-ZINC) TABLET    Take 1 tablet by mouth daily.   CHOLECALCIFEROL (VITAMIN D) 1000 UNITS TABLET    Take 1,000 Units by mouth daily.   GLUCOSAMINE 500 MG CAPS    Take 1 each by mouth daily.    Modified Medications   No medications on file  Discontinued Medications   CALCIUM-VITAMIN D (OSCAL WITH D) 500-200 MG-UNIT PER TABLET    Take 1 tablet by mouth.   HYDROCHLOROTHIAZIDE (HYDRODIURIL) 25 MG TABLET    Take 1 tablet (25 mg total) by mouth daily.   OMEGA-3 FATTY ACIDS (FISH OIL MAXIMUM STRENGTH) 1200 MG CAPS    Take 1 capsule by mouth daily.     VITAMIN C (ASCORBIC ACID) 500 MG TABLET    Take 500 mg by mouth daily.       Codeine and Demerol Outpatient Encounter Prescriptions as of  07/03/2016  Medication Sig  . amLODipine (NORVASC) 10 MG tablet TAKE 1 TABLET BY MOUTH DAILY  . aspirin 81 MG tablet Take 81 mg by mouth daily.  . B Complex-C-E-Zn (B COMPLEX-C-E-ZINC) tablet Take 1 tablet by mouth daily.  . cholecalciferol (VITAMIN D) 1000 units tablet Take 1,000 Units by mouth daily.  . Glucosamine 500 MG CAPS Take 1 each by mouth daily.    . [DISCONTINUED] calcium-vitamin D (OSCAL WITH D) 500-200 MG-UNIT per tablet Take 1 tablet by mouth.  . [DISCONTINUED] vitamin C (ASCORBIC ACID) 500 MG tablet Take 500 mg by mouth daily.    . [DISCONTINUED] hydrochlorothiazide (HYDRODIURIL) 25 MG tablet Take 1 tablet (25 mg total) by mouth daily.  . [DISCONTINUED] Omega-3 Fatty Acids (FISH OIL MAXIMUM STRENGTH) 1200 MG CAPS Take 1 capsule by mouth daily.     No facility-administered encounter medications on file as of 07/03/2016.      There are no preventive care reminders to display for this patient.   Immunization History  Administered Date(s) Administered  . Pneumococcal Conjugate-13 06/10/2015  . Pneumococcal Polysaccharide-23 07/03/2016  . Tdap 10/07/2007  . Zoster 10/22/2015     <no information>   Fall Risk  07/03/2016 03/31/2016 10/01/2015 10/08/2014  Falls in the past year? Yes No No No  Number falls in past yr: 2 or more - - -  Injury with Fall? No - - -     [facility] 07/03/2016 03/31/2016 10/01/2015  Decreased Interest 0 0 0  Down, Depressed, Hopeless 0 0 0  PHQ - 2 Score 0 0 0      Review of Systems:   ( Completed via Adult Medical History Intake form today ) General:   Denies fever, chills, appetite changes, unexplained weight loss.  Optho/Auditory:   Denies visual changes, blurred vision/LOV, ringing in ears/ diff hearing Respiratory:   Denies SOB, DOE, cough, wheezing.  Cardiovascular:   Denies chest pain, palpitations, new onset peripheral edema  Gastrointestinal:   Denies nausea, vomiting, diarrhea.  Genitourinary:    Denies dysuria,  increased frequency, flank pain.  Endocrine:     Denies hot or cold intolerance, polyuria, polydipsia. Musculoskeletal:  Denies unexplained myalgias, joint swelling, arthralgias, gait problems.  Skin:  Denies rash, suspicious lesions or new/ changes in moles Neurological:    Denies dizziness, syncope, unexplained weakness, lightheadedness, numbness  Psychiatric/Behavioral:   Denies mood changes, suicidal or homicidal ideations, hallucinations    Objective:   Blood pressure 143/90, pulse 70, height 5' 1.5" (1.562  m), weight 157 lb 12.8 oz (71.578 kg). Body mass index is 29.34 kg/(m^2).  General: Well Developed, well nourished, and in no acute distress.  Neuro: Alert and oriented x3, extra-ocular muscles intact, sensation grossly intact.  HEENT: Normocephalic, atraumatic, pupils equal round reactive to light, neck supple, no gross masses, no carotid bruits, no JVD apprec Skin: no gross suspicious lesions or rashes  Cardiac: Regular rate and rhythm, no murmurs rubs or gallops.  Respiratory: Essentially clear to auscultation bilaterally. Not using accessory muscles, speaking in full sentences.  Abdominal: Soft, not grossly distended Musculoskeletal: Ambulates w/o diff, FROM * 4 ext.  Vasc: less 2 sec cap RF, warm and pink  Psych:  No HI/SI, judgement and insight good.    Impression and Recommendations:    The patient was counselled, risk factors were discussed, anticipatory guidance given.  Hypertension Asked patient to please continue to monitor blood pressure at home. Her goal blood pressure is less than 150/90 on a regular basis, and preferably less than 140/80.  Weight loss, low salt and Mediterranean diet discussed with patient.  Tobacco use disorder TIPS on quitting smoking handout given to patient. Discussed with her ways to quit and different pharmacological treatments to help her quit. Counseling done over 3 minutes. Patient declined Wellbutrin even though she states she smokes  to relax herself and I advised this would be a good option. She states she will try to do it on her own but has no desire to at this point.  Alcohol consumption of more than two drinks per day Advised patient to cut back at least in half and keep to 1, at MAX 2 per day. Discussed that this would help with weight loss and likely improve memory.  Overweight (BMI 25.0-29.9) Routine counseling done; advised to walk at least start off at 10-15 minutes per day and work her way up to 30 or more. Advised she follow dash and or Mediterranean diet. 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  - patient states she needs a welcome to Medicare physical in October. She will schedule this.  Please see AVS handed out to patient at the end of our visit for further patient instructions/ counseling done pertaining to today's office visit.  Gross side effects, risk and benefits, and alternatives of medications discussed with patient.  Patient is aware that all medications have potential side effects and we are unable to predict every sideeffect or drug-drug interaction that may occur.  Expresses verbal understanding and consents to current therapy plan and treatment regiment.  Note: This document was prepared using Dragon voice recognition software and may include unintentional dictation errors.

## 2016-07-03 NOTE — Assessment & Plan Note (Signed)
TIPS on quitting smoking handout given to patient. Discussed with her ways to quit and different pharmacological treatments to help her quit. Counseling done over 3 minutes. Patient declined Wellbutrin even though she states she smokes to relax herself and I advised this would be a good option. She states she will try to do it on her own but has no desire to at this point.

## 2016-09-29 ENCOUNTER — Ambulatory Visit (INDEPENDENT_AMBULATORY_CARE_PROVIDER_SITE_OTHER): Payer: Medicare HMO | Admitting: Family Medicine

## 2016-09-29 ENCOUNTER — Encounter: Payer: Self-pay | Admitting: Family Medicine

## 2016-09-29 VITALS — BP 136/89 | HR 71 | Ht 61.5 in | Wt 156.4 lb

## 2016-09-29 DIAGNOSIS — Z8639 Personal history of other endocrine, nutritional and metabolic disease: Secondary | ICD-10-CM | POA: Diagnosis not present

## 2016-09-29 DIAGNOSIS — E559 Vitamin D deficiency, unspecified: Secondary | ICD-10-CM | POA: Diagnosis not present

## 2016-09-29 DIAGNOSIS — I10 Essential (primary) hypertension: Secondary | ICD-10-CM

## 2016-09-29 DIAGNOSIS — Z0000 Encounter for general adult medical examination without abnormal findings: Secondary | ICD-10-CM | POA: Diagnosis not present

## 2016-09-29 DIAGNOSIS — Z1231 Encounter for screening mammogram for malignant neoplasm of breast: Secondary | ICD-10-CM

## 2016-09-29 DIAGNOSIS — E539 Vitamin B deficiency, unspecified: Secondary | ICD-10-CM

## 2016-09-29 DIAGNOSIS — Z13820 Encounter for screening for osteoporosis: Secondary | ICD-10-CM

## 2016-09-29 DIAGNOSIS — Z23 Encounter for immunization: Secondary | ICD-10-CM

## 2016-09-29 LAB — POCT GLYCOSYLATED HEMOGLOBIN (HGB A1C): HEMOGLOBIN A1C: 5.4

## 2016-09-29 NOTE — Progress Notes (Signed)
Impression and Recommendations:    1. Routine physical examination   2. Essential hypertension   3. History of hypothyroidism   4. Vitamin D insufficiency   5. Vitamin B insufficiency   6. Need for prophylactic vaccination and inoculation against influenza     Please see orders section below for further details of actions taken during this office visit.  Gross side effects, risk and benefits, and alternatives of medications discussed with patient.  Patient is aware that all medications have potential side effects and we are unable to predict every side effect or drug-drug interaction that may occur.  Expresses verbal understanding and consents to current therapy plan and treatment regiment.  1) Anticipatory Guidance: Discussed importance of wearing a seatbelt while driving, not texting while driving; sunscreen when outside along with yearly skin surveillance; eating a well balanced and modest diet; physical activity at least 25 minutes per day or 150 min/ week of moderate to intense activity.  2) Immunizations / Screenings / Labs:  All immunizations and screenings that patient agrees to, are up-to-date per recommendations or will be updated today.  Patient understands the needs for q 16mo dental and yearly vision screens which pt will schedule independently. Obtain CBC, CMP, HgA1c, Lipid panel, TSH and vit D when fasting if not already done recently.   3) Weight:   Discussed goal of losing even 5-10% of current body weight which would improve overall feelings of well being and improve objective health data significantly.   Improve nutrient density of diet through increasing intake of fruits and vegetables and decreasing saturated/trans fats, white flour products and refined sugar products.   F-up preventative CPE in 1 year. F/up sooner for chronic care management as discussed and/or prn.  Please see orders placed and AVS handed out to patient at the end of our visit for further patient  instructions/ counseling done pertaining to today's office visit.     Subjective:   No chief complaint on file.  CC: CPE  HPI: Andrea Floyd is a 65 y.o. female [facility] a yearly health maintenance exam.  Health Maintenance Summary Reviewed and updated, unless pt declines services.  Alcohol:  No concerns, 2-3 glasses / night white wine Exercise Habits:    Walking daily 20-4530min-  STD concerns:     none Drug Use:     None Birth control method:    n/a Menses regular:     N/a Pap- 2012, c hysterectomy- N past 10 yrs prior Lumps or breast concerns:      No; last Mammo- thinks was w/in past yr-  [facility]- She will call them to find out exactly when she last went.  My records show 2014- but it was recently done Breast Cancer Family History:     GM- later in life [age]+ yo    Wt Readings from Last 3 Encounters:  09/29/16 156 lb 6.4 oz (70.9 kg)  07/03/16 157 lb 12.8 oz (71.6 kg)  03/31/16 157 lb (71.2 kg)   BP Readings from Last 3 Encounters:  09/29/16 136/89  07/03/16 (!) 143/90  03/31/16 138/86   Pulse Readings from Last 3 Encounters:  09/29/16 71  07/03/16 70  03/31/16 61     Past Medical History:  Diagnosis Date  . 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 Hypertension   . SUI (stress urinary incontinence, female) 08/20/2011  . Tobacco use disorder 07/03/2016  .  Vitamin D insufficiency 07/03/2016    Past Surgical History:  Procedure Laterality Date  . ABDOMINAL HYSTERECTOMY    . ANTERIOR AND POSTERIOR REPAIR  08/20/2011   Procedure: ANTERIOR (CYSTOCELE) AND POSTERIOR REPAIR (RECTOCELE);  Surgeon: Robley Fries;  Location: [facility];  Service: Gynecology;  Laterality: Bilateral;  With Cystoscopy  . BACK SURGERY    . BLADDER SUSPENSION  08/20/2011   Procedure: TRANSVAGINAL TAPE (TVT) PROCEDURE;  Surgeon: Robley Fries;  Location: [facility];  Service: Gynecology;  Laterality: Bilateral;  .  CHOLECYSTECTOMY    . TONSILLECTOMY    . VAGINAL HYSTERECTOMY  08/20/2011   Procedure: HYSTERECTOMY VAGINAL;  Surgeon: Robley Fries;  Location: [facility];  Service: Gynecology;  Laterality: N/A;  With Removal Of Pessary.    Family History  Problem Relation Age of Onset  . Hypertension Mother   . Alzheimer's disease Mother   . Diabetes Father   . Hypertension Father   . Cancer Sister     colon  . Hypertension Brother   . Diabetes Son   . Hypertension Brother   . Hypertension Brother     History  Drug Use No    History  Alcohol Use  . 0.0 oz/week  . 1 - 2 Glasses of wine per week    Comment: daily    History  Smoking Status  . Current Every Day Smoker  . Packs/day: 0.25  . Types: Cigarettes  Smokeless Tobacco  . Never Used    History  Sexual Activity  . Sexual activity: Yes    Current Outpatient Prescriptions on File Prior to Visit  Medication Sig Dispense Refill  . amLODipine (NORVASC) 10 MG tablet TAKE 1 TABLET BY MOUTH DAILY 90 tablet 0  . aspirin 81 MG tablet Take 81 mg by mouth daily.    . B Complex-C-E-Zn (B COMPLEX-C-E-ZINC) tablet Take 1 tablet by mouth daily.    . cholecalciferol (VITAMIN D) 1000 units tablet Take 1,000 Units by mouth daily.    . Glucosamine 500 MG CAPS Take 1 each by mouth daily.       No current facility-administered medications on file prior to visit.     Allergies: Codeine and Demerol  Review of Systems  Constitutional: Negative.  Negative for chills, diaphoresis, fever, malaise/fatigue and weight loss.  HENT: Negative.  Negative for congestion, sore throat and tinnitus.   Eyes: Negative.  Negative for blurred vision, double vision and photophobia.  Respiratory: Negative.  Negative for cough and wheezing.   Cardiovascular: Negative.  Negative for chest pain and palpitations.  Gastrointestinal: Negative.  Negative for blood in stool, diarrhea, nausea and vomiting.  Genitourinary: Negative.  Negative for dysuria, frequency and  urgency.  Musculoskeletal: Negative.  Negative for joint pain and myalgias.  Skin: Negative.  Negative for itching and rash.  Neurological: Negative.  Negative for dizziness, focal weakness, weakness and headaches.  Endo/Heme/Allergies: Negative.  Negative for environmental allergies and polydipsia. Does not bruise/bleed easily.  Psychiatric/Behavioral: Negative.  Negative for depression and memory loss. The patient is not nervous/anxious and does not have insomnia.      Objective:    Blood pressure 136/89, pulse 71, height 5' 1.5" (1.562 m), weight 156 lb 6.4 oz (70.9 kg). Body mass index is 29.07 kg/m. General Appearance:    Alert, cooperative, no distress, appears stated age  Head:    Normocephalic, without obvious abnormality, atraumatic  Eyes:    PERRL, conjunctiva/corneas clear, EOM's intact, fundi    benign, both eyes  Ears:    Normal TM's and external ear canals, both ears  Nose:   Nares normal, septum midline, mucosa normal, no drainage    or sinus tenderness  Throat:   Lips w/o lesion, mucosa moist, and tongue normal; teeth and   gums normal  Neck:   Supple, symmetrical, trachea midline, no adenopathy;    thyroid:  no enlargement/tenderness/nodules; no carotid   bruit or JVD  Back:     Symmetric, no curvature, ROM normal, no CVA tenderness  Lungs:     Clear to auscultation bilaterally, respirations unlabored, no       Wh/ R/ R  Chest Wall:    No tenderness or gross deformity; normal excursion   Heart:    Regular rate and rhythm, S1 and S2 normal, no murmur, rub   or gallop  Breast Exam:    No tenderness, masses, or nipple abnormality b/l; no d/c  Abdomen:     Soft, non-tender, bowel sounds active all four quadrants, NO   G/R/R, no masses, no organomegaly  Genitalia:    deferred  Rectal:     deferred  Extremities:   Extremities normal, atraumatic, no cyanosis or gross edema  Pulses:   2+ and symmetric all extremities  Skin:   Warm, dry, Skin color, texture, turgor normal,  no obvious rashes or lesions Psych: No HI/SI, judgement and insight good, Euthymic mood. Full Affect.  Neurologic:   CNII-XII intact, normal strength, sensation and reflexes    Throughout

## 2016-09-29 NOTE — Patient Instructions (Signed)
Preventive Care for Adults, Female A healthy lifestyle and preventive care can promote health and wellness. Preventive health guidelines for women include the following key practices.   A routine yearly physical is a good way to check with your health care provider about your health and preventive screening. It is a chance to share any concerns and updates on your health and to receive a thorough exam.   Visit your dentist for a routine exam and preventive care every 6 months. Brush your teeth twice a day and floss once a day. Good oral hygiene prevents tooth decay and gum disease.   The frequency of eye exams is based on your age, health, family medical history, use of contact lenses, and other factors. Follow your health care provider's recommendations for frequency of eye exams.   Eat a healthy diet. Foods like vegetables, fruits, whole grains, low-fat dairy products, and lean protein foods contain the nutrients you need without too many calories. Decrease your intake of foods high in solid fats, added sugars, and salt. Eat the right amount of calories for you.Get information about a proper diet from your health care provider, if necessary.   Regular physical exercise is one of the most important things you can do for your health. Most adults should get at least 150 minutes of moderate-intensity exercise (any activity that increases your heart rate and causes you to sweat) each week. In addition, most adults need muscle-strengthening exercises on 2 or more days a week.   Maintain a healthy weight. The body mass index (BMI) is a screening tool to identify possible weight problems. It provides an estimate of body fat based on height and weight. Your health care provider can find your BMI, and can help you achieve or maintain a healthy weight.For adults 20 years and older:   - A BMI below 18.5 is considered underweight.   - A BMI of 18.5 to 24.9 is normal.   - A BMI of 25 to 29.9 is  considered overweight.   - A BMI of 30 and above is considered obese.   Maintain normal blood lipids and cholesterol levels by exercising and minimizing your intake of trans and saturated fats.  Eat a balanced diet with plenty of fruit and vegetables. Blood tests for lipids and cholesterol should begin at age 85 and be repeated every 5 years minimum.  If your lipid or cholesterol levels are high, you are over 40, or you are at high risk for heart disease, you may need your cholesterol levels checked more frequently.Ongoing high lipid and cholesterol levels should be treated with medicines if diet and exercise are not working.   If you smoke, find out from your health care provider how to quit. If you do not use tobacco, do not start.   Lung cancer screening is recommended for adults aged 69-80 years who are at high risk for developing lung cancer because of a history of smoking. A yearly low-dose CT scan of the lungs is recommended for people who have at least a 30-pack-year history of smoking and are a current smoker or have quit within the past 15 years. A pack year of smoking is smoking an average of 1 pack of cigarettes a day for 1 year (for example: 1 pack a day for 30 years or 2 packs a day for 15 years). Yearly screening should continue until the smoker has stopped smoking for at least 15 years. Yearly screening should be stopped for people who develop a health  problem that would prevent them from having lung cancer treatment.   If you are pregnant, do not drink alcohol. If you are breastfeeding, be very cautious about drinking alcohol. If you are not pregnant and choose to drink alcohol, do not have more than 1 drink per day. One drink is considered to be 12 ounces (355 mL) of beer, 5 ounces (148 mL) of wine, or 1.5 ounces (44 mL) of liquor.   Avoid use of street drugs. Do not share needles with anyone. Ask for help if you need support or instructions about stopping the use of  drugs.   High blood pressure causes heart disease and increases the risk of stroke. Your blood pressure should be checked at least yearly.  Ongoing high blood pressure should be treated with medicines if weight loss and exercise do not work.   If you are 62-70 years old, ask your health care provider if you should take aspirin to prevent strokes.   Diabetes screening involves taking a blood sample to check your fasting blood sugar level. This should be done once every 3 years, after age 58, if you are within normal weight and without risk factors for diabetes. Testing should be considered at a younger age or be carried out more frequently if you are overweight and have at least 1 risk factor for diabetes.   Breast cancer screening is essential preventive care for women. You should practice "breast self-awareness."  This means understanding the normal appearance and feel of your breasts and may include breast self-examination.  Any changes detected, no matter how small, should be reported to a health care provider.  Women in their 64s and 30s should have a clinical breast exam (CBE) by a health care provider as part of a regular health exam every 1 to 3 years.  After age 54, women should have a CBE every year.  Starting at age 59, women should consider having a mammogram (breast X-ray test) every year.  Women who have a family history of breast cancer should talk to their health care provider about genetic screening.  Women at a high risk of breast cancer should talk to their health care providers about having an MRI and a mammogram every year.   -Breast cancer gene (BRCA)-related cancer risk assessment is recommended for women who have family members with BRCA-related cancers. BRCA-related cancers include breast, ovarian, tubal, and peritoneal cancers. Having family members with these cancers may be associated with an increased risk for harmful changes (mutations) in the breast cancer genes BRCA1 and  BRCA2. Results of the assessment will determine the need for genetic counseling and BRCA1 and BRCA2 testing.   The Pap test is a screening test for cervical cancer. A Pap test can show cell changes on the cervix that might become cervical cancer if left untreated. A Pap test is a procedure in which cells are obtained and examined from the lower end of the uterus (cervix).   - Women should have a Pap test starting at age 5.   - Between ages 61 and 15, Pap tests should be repeated every 2 years.   - Beginning at age 19, you should have a Pap test every 3 years as long as the past 3 Pap tests have been normal.   - Some women have medical problems that increase the chance of getting cervical cancer. Talk to your health care provider about these problems. It is especially important to talk to your health care provider if a new  problem develops soon after your last Pap test. In these cases, your health care provider may recommend more frequent screening and Pap tests.   - The above recommendations are the same for women who have or have not gotten the vaccine for human papillomavirus (HPV).   - If you had a hysterectomy for a problem that was not cancer or a condition that could lead to cancer, then you no longer need Pap tests. Even if you no longer need a Pap test, a regular exam is a good idea to make sure no other problems are starting.   - If you are between ages 60 and 39 years, and you have had normal Pap tests going back 10 years, you no longer need Pap tests. Even if you no longer need a Pap test, a regular exam is a good idea to make sure no other problems are starting.   - If you have had past treatment for cervical cancer or a condition that could lead to cancer, you need Pap tests and screening for cancer for at least 20 years after your treatment.   - If Pap tests have been discontinued, risk factors (such as a new sexual partner) need to be reassessed to determine if screening should  be resumed.   - The HPV test is an additional test that may be used for cervical cancer screening. The HPV test looks for the virus that can cause the cell changes on the cervix. The cells collected during the Pap test can be tested for HPV. The HPV test could be used to screen women aged 73 years and older, and should be used in women of any age who have unclear Pap test results. After the age of 70, women should have HPV testing at the same frequency as a Pap test.   Colorectal cancer can be detected and often prevented. Most routine colorectal cancer screening begins at the age of 63 years and continues through age 92 years. However, your health care provider may recommend screening at an earlier age if you have risk factors for colon cancer. On a yearly basis, your health care provider may provide home test kits to check for hidden blood in the stool.  Use of a small camera at the end of a tube, to directly examine the colon (sigmoidoscopy or colonoscopy), can detect the earliest forms of colorectal cancer. Talk to your health care provider about this at age 49, when routine screening begins. Direct exam of the colon should be repeated every 5 -10 years through age 54 years, unless early forms of pre-cancerous polyps or small growths are found.   People who are at an increased risk for hepatitis B should be screened for this virus. You are considered at high risk for hepatitis B if:  -You were born in a country where hepatitis B occurs often. Talk with your health care provider about which countries are considered high risk.  - Your parents were born in a high-risk country and you have not received a shot to protect against hepatitis B (hepatitis B vaccine).  - You have HIV or AIDS.  - You use needles to inject street drugs.  - You live with, or have sex with, someone who has Hepatitis B.  - You get hemodialysis treatment.  - You take certain medicines for conditions like cancer, organ  transplantation, and autoimmune conditions.   Hepatitis C blood testing is recommended for all people born from [birth year] through 1965 and any individual with  known risks for hepatitis C.   Practice safe sex. Use condoms and avoid high-risk sexual practices to reduce the spread of sexually transmitted infections (STIs). STIs include gonorrhea, chlamydia, syphilis, trichomonas, herpes, HPV, and human immunodeficiency virus (HIV). Herpes, HIV, and HPV are viral illnesses that have no cure. They can result in disability, cancer, and death. Sexually active women aged 63 years and younger should be checked for chlamydia. Older women with new or multiple partners should also be tested for chlamydia. Testing for other STIs is recommended if you are sexually active and at increased risk.   Osteoporosis is a disease in which the bones lose minerals and strength with aging. This can result in serious bone fractures or breaks. The risk of osteoporosis can be identified using a bone density scan. Women ages 60 years and over and women at risk for fractures or osteoporosis should discuss screening with their health care providers. Ask your health care provider whether you should take a calcium supplement or vitamin D to reduce the rate of osteoporosis.   Menopause can be associated with physical symptoms and risks. Hormone replacement therapy is available to decrease symptoms and risks. You should talk to your health care provider about whether hormone replacement therapy is right for you.   Use sunscreen. Apply sunscreen liberally and repeatedly throughout the day. You should seek shade when your shadow is shorter than you. Protect yourself by wearing long sleeves, pants, a wide-brimmed hat, and sunglasses year round, whenever you are outdoors.   Once a month, do a whole body skin exam, using a mirror to look at the skin on your back. Tell your health care provider of new moles, moles that have irregular borders,  moles that are larger than a pencil eraser, or moles that have changed in shape or color.   Stay current with required vaccines (immunizations).   Influenza vaccine. All adults should be immunized every year.   Tetanus, diphtheria, and acellular pertussis (Td, Tdap) vaccine. Pregnant women should receive 1 dose of Tdap vaccine during each pregnancy. The dose should be obtained regardless of the length of time since the last dose. Immunization is preferred during the 27th 36th week of gestation. An adult who has not previously received Tdap or who does not know her vaccine status should receive 1 dose of Tdap. This initial dose should be followed by tetanus and diphtheria toxoids (Td) booster doses every 10 years. Adults with an unknown or incomplete history of completing a 3-dose immunization series with Td-containing vaccines should begin or complete a primary immunization series including a Tdap dose. Adults should receive a Td booster every 10 years.   Varicella vaccine. An adult without evidence of immunity to varicella should receive 2 doses or a second dose if she has previously received 1 dose. Pregnant females who do not have evidence of immunity should receive the first dose after pregnancy. This first dose should be obtained before leaving the health care facility. The second dose should be obtained 4 8 weeks after the first dose.   Human papillomavirus (HPV) vaccine. Females aged 83 26 years who have not received the vaccine previously should obtain the 3-dose series. The vaccine is not recommended for use in pregnant females. However, pregnancy testing is not needed before receiving a dose. If a female is found to be pregnant after receiving a dose, no treatment is needed. In that case, the remaining doses should be delayed until after the pregnancy. Immunization is recommended for any person with  an immunocompromised condition through the age of 28 years if she did not get any or all  doses earlier. During the 3-dose series, the second dose should be obtained 4 8 weeks after the first dose. The third dose should be obtained 24 weeks after the first dose and 16 weeks after the second dose.   Zoster vaccine. One dose is recommended for adults aged 35 years or older unless certain conditions are present.   Measles, mumps, and rubella (MMR) vaccine. Adults born before 65 generally are considered immune to measles and mumps. Adults born in [birth year] or later should have 1 or more doses of MMR vaccine unless there is a contraindication to the vaccine or there is laboratory evidence of immunity to each of the three diseases. A routine second dose of MMR vaccine should be obtained at least 28 days after the first dose for students attending postsecondary schools, health care workers, or international travelers. People who received inactivated measles vaccine or an unknown type of measles vaccine during 1963 1967 should receive 2 doses of MMR vaccine. People who received inactivated mumps vaccine or an unknown type of mumps vaccine before 1979 and are at high risk for mumps infection should consider immunization with 2 doses of MMR vaccine. For females of childbearing age, rubella immunity should be determined. If there is no evidence of immunity, females who are not pregnant should be vaccinated. If there is no evidence of immunity, females who are pregnant should delay immunization until after pregnancy. Unvaccinated health care workers born before [birth year] who lack laboratory evidence of measles, mumps, or rubella immunity or laboratory confirmation of disease should consider measles and mumps immunization with 2 doses of MMR vaccine or rubella immunization with 1 dose of MMR vaccine.   Pneumococcal 13-valent conjugate (PCV13) vaccine. When indicated, a person who is uncertain of her immunization history and has no record of immunization should receive the PCV13 vaccine. An adult aged 77 years or  older who has certain medical conditions and has not been previously immunized should receive 1 dose of PCV13 vaccine. This PCV13 should be followed with a dose of pneumococcal polysaccharide (PPSV23) vaccine. The PPSV23 vaccine dose should be obtained at least 8 weeks after the dose of PCV13 vaccine. An adult aged 108 years or older who has certain medical conditions and previously received 1 or more doses of PPSV23 vaccine should receive 1 dose of PCV13. The PCV13 vaccine dose should be obtained 1 or more years after the last PPSV23 vaccine dose.   Pneumococcal polysaccharide (PPSV23) vaccine. When PCV13 is also indicated, PCV13 should be obtained first. All adults aged 52 years and older should be immunized. An adult younger than age 19 years who has certain medical conditions should be immunized. Any person who resides in a nursing home or long-term care facility should be immunized. An adult smoker should be immunized. People with an immunocompromised condition and certain other conditions should receive both PCV13 and PPSV23 vaccines. People with human immunodeficiency virus (HIV) infection should be immunized as soon as possible after diagnosis. Immunization during chemotherapy or radiation therapy should be avoided. Routine use of PPSV23 vaccine is not recommended for American Indians, [place]atives, or people younger than 65 years unless there are medical conditions that require PPSV23 vaccine. When indicated, people who have unknown immunization and have no record of immunization should receive PPSV23 vaccine. One-time revaccination 5 years after the first dose of PPSV23 is recommended for people aged 58 64 years who have chronic  kidney failure, nephrotic syndrome, asplenia, or immunocompromised conditions. People who received 1 2 doses of PPSV23 before age 34 years should receive another dose of PPSV23 vaccine at age 28 years or later if at least 5 years have passed since the previous dose. Doses of  PPSV23 are not needed for people immunized with PPSV23 at or after age 48 years.   Meningococcal vaccine. Adults with asplenia or persistent complement component deficiencies should receive 2 doses of quadrivalent meningococcal conjugate (MenACWY-D) vaccine. The doses should be obtained at least 2 months apart. Microbiologists working with certain meningococcal bacteria, [employer] recruits, people at risk during an outbreak, and people who travel to or live in countries with a high rate of meningitis should be immunized. A first-year college student up through age 75 years who is living in a residence hall should receive a dose if she did not receive a dose on or after her 16th birthday. Adults who have certain high-risk conditions should receive one or more doses of vaccine.   Hepatitis A vaccine. Adults who wish to be protected from this disease, have certain high-risk conditions, work with hepatitis A-infected animals, work in hepatitis A research labs, or travel to or work in countries with a high rate of hepatitis A should be immunized. Adults who were previously unvaccinated and who anticipate close contact with an international adoptee during the first 60 days after arrival in the [place] from a country with a high rate of hepatitis A should be immunized.   Hepatitis B vaccine. Adults who wish to be protected from this disease, have certain high-risk conditions, may be exposed to blood or other infectious body fluids, are household contacts or sex partners of hepatitis B positive people, are clients or workers in certain care facilities, or travel to or work in countries with a high rate of hepatitis B should be immunized.   Haemophilus influenzae type b (Hib) vaccine. A previously unvaccinated person with asplenia or sickle cell disease or having a scheduled splenectomy should receive 1 dose of Hib vaccine. Regardless of previous immunization, a recipient of a hematopoietic stem cell  transplant should receive a 3-dose series 6 12 months after her successful transplant. Hib vaccine is not recommended for adults with HIV infection.  Preventive Services / Frequency Ages 35 to 39years  Blood pressure check.** / Every 1 to 2 years.  Lipid and cholesterol check.** / Every 5 years beginning at age 28.  Clinical breast exam.** / Every 3 years for women in their 31s and 7s.  BRCA-related cancer risk assessment.** / For women who have family members with a BRCA-related cancer (breast, ovarian, tubal, or peritoneal cancers).  Pap test.** / Every 2 years from ages 42 through 56. Every 3 years starting at age 68 through age 20 or 27 with a history of 3 consecutive normal Pap tests.  HPV screening.** / Every 3 years from ages 81 through ages 42 to 39 with a history of 3 consecutive normal Pap tests.  Hepatitis C blood test.** / For any individual with known risks for hepatitis C.  Skin self-exam. / Monthly.  Influenza vaccine. / Every year.  Tetanus, diphtheria, and acellular pertussis (Tdap, Td) vaccine.** / Consult your health care provider. Pregnant women should receive 1 dose of Tdap vaccine during each pregnancy. 1 dose of Td every 10 years.  Varicella vaccine.** / Consult your health care provider. Pregnant females who do not have evidence of immunity should receive the first dose after pregnancy.  HPV vaccine. /  3 doses over 6 months, if 26 and younger. The vaccine is not recommended for use in pregnant females. However, pregnancy testing is not needed before receiving a dose.  Measles, mumps, rubella (MMR) vaccine.** / You need at least 1 dose of MMR if you were born in 1957 or later. You may also need a 2nd dose. For females of childbearing age, rubella immunity should be determined. If there is no evidence of immunity, females who are not pregnant should be vaccinated. If there is no evidence of immunity, females who are pregnant should delay immunization until after  pregnancy.  Pneumococcal 13-valent conjugate (PCV13) vaccine.** / Consult your health care provider.  Pneumococcal polysaccharide (PPSV23) vaccine.** / 1 to 2 doses if you smoke cigarettes or if you have certain conditions.  Meningococcal vaccine.** / 1 dose if you are age 26 to 27 years and a Market researcher living in a residence hall, or have one of several medical conditions, you need to get vaccinated against meningococcal disease. You may also need additional booster doses.  Hepatitis A vaccine.** / Consult your health care provider.  Hepatitis B vaccine.** / Consult your health care provider.  Haemophilus influenzae type b (Hib) vaccine.** / Consult your health care provider.  Ages 52 to 64years  Blood pressure check.** / Every 1 to 2 years.  Lipid and cholesterol check.** / Every 5 years beginning at age 12 years.  Lung cancer screening. / Every year if you are aged 75 80 years and have a 30-pack-year history of smoking and currently smoke or have quit within the past 15 years. Yearly screening is stopped once you have quit smoking for at least 15 years or develop a health problem that would prevent you from having lung cancer treatment.  Clinical breast exam.** / Every year after age 42 years.  BRCA-related cancer risk assessment.** / For women who have family members with a BRCA-related cancer (breast, ovarian, tubal, or peritoneal cancers).  Mammogram.** / Every year beginning at age 27 years and continuing for as long as you are in good health. Consult with your health care provider.  Pap test.** / Every 3 years starting at age 32 years through age 80 or 10 years with a history of 3 consecutive normal Pap tests.  HPV screening.** / Every 3 years from ages 72 years through ages 66 to 85 years with a history of 3 consecutive normal Pap tests.  Fecal occult blood test (FOBT) of stool. / Every year beginning at age 3 years and continuing until age 100 years. You may  not need to do this test if you get a colonoscopy every 10 years.  Flexible sigmoidoscopy or colonoscopy.** / Every 5 years for a flexible sigmoidoscopy or every 10 years for a colonoscopy beginning at age 67 years and continuing until age 1 years.  Hepatitis C blood test.** / For all people born from [birth year] through 1967 and any individual with known risks for hepatitis C.  Skin self-exam. / Monthly.  Influenza vaccine. / Every year.  Tetanus, diphtheria, and acellular pertussis (Tdap/Td) vaccine.** / Consult your health care provider. Pregnant women should receive 1 dose of Tdap vaccine during each pregnancy. 1 dose of Td every 10 years.  Varicella vaccine.** / Consult your health care provider. Pregnant females who do not have evidence of immunity should receive the first dose after pregnancy.  Zoster vaccine.** / 1 dose for adults aged 69 years or older.  Measles, mumps, rubella (MMR) vaccine.** / You need at  least 1 dose of MMR if you were born in 1957 or later. You may also need a 2nd dose. For females of childbearing age, rubella immunity should be determined. If there is no evidence of immunity, females who are not pregnant should be vaccinated. If there is no evidence of immunity, females who are pregnant should delay immunization until after pregnancy.  Pneumococcal 13-valent conjugate (PCV13) vaccine.** / Consult your health care provider.  Pneumococcal polysaccharide (PPSV23) vaccine.** / 1 to 2 doses if you smoke cigarettes or if you have certain conditions.  Meningococcal vaccine.** / Consult your health care provider.  Hepatitis A vaccine.** / Consult your health care provider.  Hepatitis B vaccine.** / Consult your health care provider.  Haemophilus influenzae type b (Hib) vaccine.** / Consult your health care provider.  Ages 79 years and over  Blood pressure check.** / Every 1 to 2 years.  Lipid and cholesterol check.** / Every 5 years beginning at age 38  years.  Lung cancer screening. / Every year if you are aged 38 80 years and have a 30-pack-year history of smoking and currently smoke or have quit within the past 15 years. Yearly screening is stopped once you have quit smoking for at least 15 years or develop a health problem that would prevent you from having lung cancer treatment.  Clinical breast exam.** / Every year after age 39 years.  BRCA-related cancer risk assessment.** / For women who have family members with a BRCA-related cancer (breast, ovarian, tubal, or peritoneal cancers).  Mammogram.** / Every year beginning at age 26 years and continuing for as long as you are in good health. Consult with your health care provider.  Pap test.** / Every 3 years starting at age 3 years through age 70 or 79 years with 3 consecutive normal Pap tests. Testing can be stopped between 65 and 70 years with 3 consecutive normal Pap tests and no abnormal Pap or HPV tests in the past 10 years.  HPV screening.** / Every 3 years from ages 44 years through ages 71 or 8 years with a history of 3 consecutive normal Pap tests. Testing can be stopped between 65 and 70 years with 3 consecutive normal Pap tests and no abnormal Pap or HPV tests in the past 10 years.  Fecal occult blood test (FOBT) of stool. / Every year beginning at age 105 years and continuing until age 4 years. You may not need to do this test if you get a colonoscopy every 10 years.  Flexible sigmoidoscopy or colonoscopy.** / Every 5 years for a flexible sigmoidoscopy or every 10 years for a colonoscopy beginning at age 53 years and continuing until age 59 years.  Hepatitis C blood test.** / For all people born from [birth year] through 1967 and any individual with known risks for hepatitis C.  Osteoporosis screening.** / A one-time screening for women ages 67 years and over and women at risk for fractures or osteoporosis.  Skin self-exam. / Monthly.  Influenza vaccine. / Every year.  Tetanus,  diphtheria, and acellular pertussis (Tdap/Td) vaccine.** / 1 dose of Td every 10 years.  Varicella vaccine.** / Consult your health care provider.  Zoster vaccine.** / 1 dose for adults aged 3 years or older.  Pneumococcal 13-valent conjugate (PCV13) vaccine.** / Consult your health care provider.  Pneumococcal polysaccharide (PPSV23) vaccine.** / 1 dose for all adults aged 46 years and older.  Meningococcal vaccine.** / Consult your health care provider.  Hepatitis A vaccine.** / Consult your health  care provider.  Hepatitis B vaccine.** / Consult your health care provider.  Haemophilus influenzae type b (Hib) vaccine.** / Consult your health care provider.     ** Family history and personal history of risk and conditions may change your health care provider's recommendations. Document Released: 02/09/2002 Document Revised: 10/04/2013  [facility] Patient Information 2014 [place], [place].

## 2016-09-30 LAB — COMPREHENSIVE METABOLIC PANEL
ALT: 25 U/L (ref 6–29)
AST: 25 U/L (ref 10–35)
Albumin: 4.6 g/dL (ref 3.6–5.1)
Alkaline Phosphatase: 64 U/L (ref 33–130)
BUN: 11 mg/dL (ref 7–25)
CALCIUM: 9.4 mg/dL (ref 8.6–10.4)
CHLORIDE: 100 mmol/L (ref 98–110)
CO2: 24 mmol/L (ref 20–31)
Creat: 0.73 mg/dL (ref 0.50–0.99)
GLUCOSE: 91 mg/dL (ref 65–99)
POTASSIUM: 4.4 mmol/L (ref 3.5–5.3)
Sodium: 138 mmol/L (ref 135–146)
Total Bilirubin: 0.9 mg/dL (ref 0.2–1.2)
Total Protein: 7.4 g/dL (ref 6.1–8.1)

## 2016-09-30 LAB — CBC WITH DIFFERENTIAL/PLATELET
BASOS PCT: 0 %
Basophils Absolute: 0 cells/uL (ref 0–200)
EOS PCT: 3 %
Eosinophils Absolute: 249 cells/uL (ref 15–500)
HCT: 50.1 % — ABNORMAL HIGH (ref 35.0–45.0)
HEMOGLOBIN: 17 g/dL — AB (ref 11.7–15.5)
LYMPHS ABS: 1992 {cells}/uL (ref 850–3900)
Lymphocytes Relative: 24 %
MCH: 31.4 pg (ref 27.0–33.0)
MCHC: 33.9 g/dL (ref 32.0–36.0)
MCV: 92.6 fL (ref 80.0–100.0)
MPV: 11.3 fL (ref 7.5–12.5)
Monocytes Absolute: 747 cells/uL (ref 200–950)
Monocytes Relative: 9 %
NEUTROS ABS: 5312 {cells}/uL (ref 1500–7800)
Neutrophils Relative %: 64 %
Platelets: 359 10*3/uL (ref 140–400)
RBC: 5.41 MIL/uL — AB (ref 3.80–5.10)
RDW: 13.9 % (ref 11.0–15.0)
WBC: 8.3 10*3/uL (ref 3.8–10.8)

## 2016-09-30 LAB — VITAMIN D 25 HYDROXY (VIT D DEFICIENCY, FRACTURES): Vit D, 25-Hydroxy: 46 ng/mL (ref 30–100)

## 2016-09-30 LAB — LIPID PANEL
Cholesterol: 234 mg/dL — ABNORMAL HIGH (ref 125–200)
HDL: 49 mg/dL (ref >=46)
LDL CALC: 155 mg/dL — AB (ref <130)
Total CHOL/HDL Ratio: 4.8 Ratio (ref <=5.0)
Triglycerides: 151 mg/dL — ABNORMAL HIGH (ref <150)
VLDL: 30 mg/dL (ref <30)

## 2016-09-30 LAB — TSH: TSH: 3.05 m[IU]/L

## 2016-09-30 LAB — HEPATITIS C ANTIBODY: HCV AB: NEGATIVE

## 2016-09-30 LAB — VITAMIN B12: VITAMIN B 12: 291 pg/mL (ref 200–1100)

## 2016-11-13 ENCOUNTER — Other Ambulatory Visit: Payer: Self-pay | Admitting: Family Medicine

## 2016-11-16 ENCOUNTER — Other Ambulatory Visit: Payer: Self-pay

## 2016-11-16 MED ORDER — AMLODIPINE BESYLATE 10 MG PO TABS: 10.0000 mg | ORAL_TABLET | Freq: Every day | ORAL | 0 refills | Status: DC

## 2016-11-18 ENCOUNTER — Other Ambulatory Visit: Payer: Self-pay | Admitting: Family Medicine

## 2016-11-24 ENCOUNTER — Telehealth: Payer: Self-pay | Admitting: Family Medicine

## 2016-11-24 DIAGNOSIS — M810 Age-related osteoporosis without current pathological fracture: Secondary | ICD-10-CM

## 2016-11-24 NOTE — Telephone Encounter (Signed)
Need bone density diagnosis to be changed for billing, what Dr. Val Eagle used will not be covered by insurance

## 2016-11-24 NOTE — Addendum Note (Signed)
Addended by: Stan HeadNELSON, TONYA S on: 11/24/2016 11:04 AM   Modules accepted: Orders

## 2017-03-01 ENCOUNTER — Other Ambulatory Visit: Payer: Self-pay | Admitting: Family Medicine

## 2017-03-04 DIAGNOSIS — Z1231 Encounter for screening mammogram for malignant neoplasm of breast: Secondary | ICD-10-CM | POA: Diagnosis not present

## 2017-03-04 DIAGNOSIS — Z803 Family history of malignant neoplasm of breast: Secondary | ICD-10-CM | POA: Diagnosis not present

## 2017-03-04 LAB — HM MAMMOGRAPHY

## 2017-06-03 ENCOUNTER — Other Ambulatory Visit: Payer: Self-pay | Admitting: Adult Health

## 2017-07-05 ENCOUNTER — Encounter: Payer: Self-pay | Admitting: Family Medicine

## 2017-07-05 ENCOUNTER — Ambulatory Visit (INDEPENDENT_AMBULATORY_CARE_PROVIDER_SITE_OTHER): Payer: PPO | Admitting: Family Medicine

## 2017-07-05 VITALS — BP 146/83 | HR 70 | Ht 61.5 in | Wt 160.0 lb

## 2017-07-05 DIAGNOSIS — F17200 Nicotine dependence, unspecified, uncomplicated: Secondary | ICD-10-CM

## 2017-07-05 DIAGNOSIS — E782 Mixed hyperlipidemia: Secondary | ICD-10-CM

## 2017-07-05 DIAGNOSIS — E781 Pure hyperglyceridemia: Secondary | ICD-10-CM | POA: Insufficient documentation

## 2017-07-05 DIAGNOSIS — E663 Overweight: Secondary | ICD-10-CM

## 2017-07-05 DIAGNOSIS — Z5181 Encounter for therapeutic drug level monitoring: Secondary | ICD-10-CM

## 2017-07-05 DIAGNOSIS — I10 Essential (primary) hypertension: Secondary | ICD-10-CM

## 2017-07-05 DIAGNOSIS — Z723 Lack of physical exercise: Secondary | ICD-10-CM

## 2017-07-05 DIAGNOSIS — Z789 Other specified health status: Secondary | ICD-10-CM

## 2017-07-05 MED ORDER — AMLODIPINE BESYLATE 10 MG PO TABS: 10.0000 mg | ORAL_TABLET | Freq: Every day | ORAL | 1 refills | Status: DC

## 2017-07-05 MED ORDER — OLMESARTAN MEDOXOMIL 20 MG PO TABS: 10.0000 mg | ORAL_TABLET | Freq: Every day | ORAL | 1 refills | Status: DC

## 2017-07-05 MED ORDER — ATORVASTATIN CALCIUM 40 MG PO TABS: 40.0000 mg | ORAL_TABLET | Freq: Every day | ORAL | 1 refills | Status: DC

## 2017-07-05 NOTE — Progress Notes (Signed)
Impression and Recommendations:    1. Essential hypertension   2. Mixed hyperlipidemia   3. Hypertriglyceridemia   4. Medication monitoring encounter   5. Tobacco use disorder   6. Alcohol consumption of more than two drinks per day   7. Overweight (BMI 25.0-29.9)     1.  We will add losartan to your medication regimen -the amlodipine.  Patient did not tolerate hydrochlorothiazide (I believe) in the past or ACE inhibitor.   Please try to monitor your blood pressure 3-4 times per week at random times- write it down and bring in the log of your blood pressures next office visit.   Follow up sooner than 4 months if blood pressure is not under good control  2.  We'll add atorvastatin to regimen today.     Patient understands we need to recheck her ALT in 6 weeks after starting the atorvastatin.   She'll make an appointment for this lab only visit today.  She will follow-up in 4 months with me to recheck her fasting lipid profile.   Take the Lipitor one half a tablet nightly before bed- and then if tolerated it well we'll go up to one full tablet.  She will drink one half of her weight in ounces water per day.  3.  Encouraged to eat more fish oil or take over-the-counter medications for this.  4.  She understands need for follow-up of her liver enzymes  5.   Patient declines any quitting smoking materials today and says she has no desire to QUIT  6.  Encouraged to cut back to one glass of wine nightly rather than 3 or more  7.  Weight loss would help with blood pressure management   The patient was counseled, risk factors were discussed, anticipatory guidance given. .  New Prescriptions   ATORVASTATIN (LIPITOR) 40 MG TABLET    Take 1 tablet (40 mg total) by mouth at bedtime.   OLMESARTAN (BENICAR) 20 MG TABLET    Take 0.5 tablets (10 mg total) by mouth daily.    Orders Placed This Encounter  Procedures  . ALT   Gross side effects, risk and benefits, and alternatives of  medications and treatment plan in general discussed with patient.  Patient is aware that all medications have potential side effects and we are unable to predict every side effect or drug-drug interaction that may occur.   Patient will call with any questions prior to using medication if they have concerns.  Expresses verbal understanding and consents to current therapy and treatment regimen.  No barriers to understanding were identified.  Red flag symptoms and signs discussed in detail.  Patient expressed understanding regarding what to do in case of emergency\urgent symptoms.  Pt was in the office today for 40+ minutes, with over 50% time spent in face to face counseling of patients various medical conditions, treatment plans of those medical conditions including medicine management and lifestyle modification, strategies to improve health and well being; and in coordination of care.  This was especially focused on her blood pressure and cholesterol issues as well as her smoking, excess alcohol usage and inactivity.  SEE ABOVE FOR DETAILS  Please see AVS handed out to patient at the end of our visit for further patient instructions/ counseling done pertaining to today's office visit.   Return in about 4 months (around 11/05/2017) for 6 weeks for blood work only then four-month follow-up with me for blood pressure, cholesterol, etc..  Note: This document was prepared using Dragon voice recognition software and may include unintentional dictation errors.  Thomasene Lot 5:31 PM --------------------------------------------------------------------------------------------------------------------------------------------------------------------------------------------------------------------------------------------    Subjective:    CC:  Chief Complaint  Patient presents with  . Hypertension    follow up     HPI: Andrea Floyd is a 66 y.o. female who presents to [facility] at  [facility] today for issues as discussed below-  she is here for chronic disease management follow-up.  -->  HTN:  Running at home- 140's-150's/  80- 90's- checks it once wkly if that,  No sx at all.  Otherwise, not a lot of salt, walking 1 mile 5 days/ week.   --> Smoking- only 2 cig/ day on average. Husband smokes cigars.  Only smokes outside- not indoors.   -->  Still having 2-3 glasses wine per night.  Doesn't plan on quitting her drinking or smoking.  -->   Hyperlipidemia and hypertriglyceridemia - patient was asked to follow-up after her most recent lab draw to discuss abnormalities and she never did.    LDL 155 about 9 months ago.  Calculated 10 year atherosclerotic cardiovascular disease risk is over 22% which is what we did in the office today.  Was told the past she needed cholesterol medications and has always declined these meds from her prior physicians.  Patient admits today that she will not be changing her smoking, drinking or eating habits     Wt Readings from Last 3 Encounters:  07/05/17 160 lb (72.6 kg)  09/29/16 156 lb 6.4 oz (70.9 kg)  07/03/16 157 lb 12.8 oz (71.6 kg)   BP Readings from Last 3 Encounters:  07/05/17 (!) 146/83  09/29/16 136/89  07/03/16 (!) 143/90   Pulse Readings from Last 3 Encounters:  07/05/17 70  09/29/16 71  07/03/16 70   BMI Readings from Last 3 Encounters:  07/05/17 29.74 kg/m  09/29/16 29.07 kg/m  07/03/16 29.33 kg/m     Patient Care Team    Relationship Specialty Notifications Start End  Thomasene Lot, DO PCP - General Family Medicine  07/03/16      Patient Active Problem List   Diagnosis Date Noted  . Mixed hyperlipidemia 07/05/2017  . Hypertriglyceridemia 07/05/2017  . Medication monitoring encounter 07/05/2017  . Tobacco use disorder 07/03/2016  . History of hypothyroidism 07/03/2016  . 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 Vitamin D insufficiency 07/03/2016  . Vitamin B insufficiency  07/03/2016  . Overweight (BMI 25.0-29.9) 07/03/2016  . History of TAH 07/03/2016  . Hypertension 08/24/2013  . SUI (stress urinary incontinence, female) 08/20/2011    Class: Chronic    Past Medical history, Surgical history, Family history, Social history, Allergies and Medications have been entered into the medical record, reviewed and changed as needed.    Current Meds  Medication Sig  . amLODipine (NORVASC) 10 MG tablet Take 1 tablet (10 mg total) by mouth daily.  Marland Kitchen aspirin 81 MG tablet Take 81 mg by mouth daily.  . B Complex-C-E-Zn (B COMPLEX-C-E-ZINC) tablet Take 1 tablet by mouth daily.  . cholecalciferol (VITAMIN D) 1000 units tablet Take 1,000 Units by mouth daily.  . Glucosamine 500 MG CAPS Take 1 each by mouth daily.    . [DISCONTINUED] amLODipine (NORVASC) 10 MG tablet TAKE 1 TABLET BY MOUTH DAILY.  . [DISCONTINUED] amLODipine (NORVASC) 10 MG tablet Take 1 tablet (10 mg total) by mouth daily.    Allergies:  Allergies  Allergen Reactions  . Codeine Shortness Of Breath  . Demerol Nausea And Vomiting     Review of Systems: General:   Denies fever, chills, unexplained weight loss.  Optho/Auditory:   Denies visual changes, blurred vision/LOV Respiratory:   Denies wheeze, DOE more than baseline levels.  Cardiovascular:   Denies chest pain, palpitations, new onset peripheral edema  Gastrointestinal:   Denies nausea, vomiting, diarrhea, abd pain.  Genitourinary: Denies dysuria, freq/ urgency, flank pain or discharge from genitals.  Endocrine:     Denies hot or cold intolerance, polyuria, polydipsia. Musculoskeletal:   Denies unexplained myalgias, joint swelling, unexplained arthralgias, gait problems.  Skin:  Denies new onset rash, suspicious lesions Neurological:     Denies dizziness, unexplained weakness, numbness  Psychiatric/Behavioral:   Denies mood changes, suicidal or homicidal ideations, hallucinations    Objective:   Blood pressure (!) 146/83, pulse 70,  height 5' 1.5" (1.562 m), weight 160 lb (72.6 kg). Body mass index is 29.74 kg/m. General:  Well Developed, well nourished, appropriate for stated age.  Neuro:  Alert and oriented,  extra-ocular muscles intact  HEENT:  Normocephalic, atraumatic, neck supple, no carotid bruits appreciated  Skin:  no gross rash, warm, pink. Cardiac:  RRR, S1 S2 Respiratory:  ECTA B/L and A/P, Not using accessory muscles, speaking in full sentences- unlabored. Vascular:  Ext warm, no cyanosis apprec.; cap RF less 2 sec. Psych:  No HI/SI, judgement and insight good, Euthymic mood. Full Affect.

## 2017-07-05 NOTE — Patient Instructions (Addendum)
6 wks- f/up for alt because we started you on atorvastatin today.  - Take the Lipitor one half a tablet nightly before bed.  - Also start the new blood pressure medicine daily in the mornings.  Please try to monitor your blood pressure 3-4 times per week at random times    Guidelines for a Low Cholesterol, Low Saturated Fat Diet   Fats - Limit total intake of fats and oils. - Avoid butter, stick margarine, shortening, lard, palm and coconut oils. - Limit mayonnaise, salad dressings, gravies and sauces, unless they are homemade with low-fat ingredients. - Limit chocolate. - Choose low-fat and nonfat products, such as low-fat mayonnaise, low-fat or non-hydrogenated peanut butter, low-fat or fat-free salad dressings and nonfat gravy. - Use vegetable oil, such as canola or olive oil. - Look for margarine that does not contain trans fatty acids. - Use nuts in moderate amounts. - Read ingredient labels carefully to determine both amount and type of fat present in foods. Limit saturated and trans fats! - Avoid high-fat processed and convenience foods.  Meats and Meat Alternatives - Choose fish, chicken, [place] and lean meats. - Use dried beans, peas, lentils and tofu. - Limit egg yolks to three to four per week. - If you eat red meat, limit to no more than three servings per week and choose loin or round cuts. - Avoid fatty meats, such as bacon, sausage, franks, luncheon meats and ribs. - Avoid all organ meats, including liver.  Dairy - Choose nonfat or low-fat milk, yogurt and cottage cheese. - Most cheeses are high in fat. Choose cheeses made from non-fat milk, such as mozzarella and ricotta cheese. - Choose light or fat-free cream cheese and sour cream. - Avoid cream and sauces made with cream.  Fruits and Vegetables - Eat a wide variety of fruits and vegetables. - Use lemon juice, vinegar or "mist" olive oil on vegetables. - Avoid adding sauces, fat or oil to  vegetables.  Breads, Cereals and Grains - Choose whole-grain breads, cereals, pastas and rice. - Avoid high-fat snack foods, such as granola, cookies, pies, pastries, doughnuts and croissants.  Cooking Tips - Avoid deep fried foods. - Trim visible fat off meats and remove skin from poultry before cooking. - Bake, broil, boil, poach or roast poultry, fish and lean meats. - Drain and discard fat that drains out of meat as you cook it. - Add little or no fat to foods. - Use vegetable oil sprays to grease pans for cooking or baking. - Steam vegetables. - Use herbs or no-oil marinades to flavor foods.

## 2017-08-11 ENCOUNTER — Other Ambulatory Visit: Payer: PPO

## 2017-08-11 DIAGNOSIS — Z5181 Encounter for therapeutic drug level monitoring: Secondary | ICD-10-CM

## 2017-08-12 LAB — ALT: ALT: 34 IU/L — AB (ref 0–32)

## 2017-10-05 ENCOUNTER — Ambulatory Visit (INDEPENDENT_AMBULATORY_CARE_PROVIDER_SITE_OTHER): Payer: PPO | Admitting: Family Medicine

## 2017-10-05 ENCOUNTER — Encounter: Payer: Self-pay | Admitting: Family Medicine

## 2017-10-05 VITALS — BP 150/87 | HR 66 | Ht 61.5 in | Wt 160.0 lb

## 2017-10-05 DIAGNOSIS — Z23 Encounter for immunization: Secondary | ICD-10-CM

## 2017-10-05 DIAGNOSIS — Z789 Other specified health status: Secondary | ICD-10-CM

## 2017-10-05 DIAGNOSIS — E559 Vitamin D deficiency, unspecified: Secondary | ICD-10-CM

## 2017-10-05 DIAGNOSIS — F17200 Nicotine dependence, unspecified, uncomplicated: Secondary | ICD-10-CM

## 2017-10-05 DIAGNOSIS — E782 Mixed hyperlipidemia: Secondary | ICD-10-CM

## 2017-10-05 DIAGNOSIS — I10 Essential (primary) hypertension: Secondary | ICD-10-CM

## 2017-10-05 NOTE — Progress Notes (Signed)
Assessment and plan:  1. Essential hypertension   2. Mixed hyperlipidemia   3. Tobacco use disorder   4. Alcohol consumption of more than two drinks per day   5. Vitamin D insufficiency   6. Flu vaccine need    - Patient will start her on losartan in addition to amlodipine.  She will keep check of what her blood pressures running at home and keep a log.  She'll bring this in next office visit.  She will let us know sooner than planned follow-up if she is having any problems  - Also patient will start the atorvastatin before bedtime.  Nothing to eat or drink after taking it.  We will recheck her cholesterol in about 3-4 months.  - If in the future you develop signs or symptoms of  a urinary tract infection or concerns of having one, please call the office and at the very least we can get you in just for a lab only urinalysis. preferably, we can get you in for an acute care visit with [facility] or I.    Pt was in the office today for 40+ minutes, with over 50% time spent in face to face counseling of patients various medical conditions, treatment plans of those medical conditions including medicine management and lifestyle modification, strategies to improve health and well being; and in coordination of care. SEE ABOVE FOR DETAILS Orders Placed This Encounter  Procedures  . Flu vaccine HIGH DOSE PF    Return for 3-4 months- come fastign for bldwrk, then OV 1 wk later with me to discuss. .  Anticipatory guidance and routine counseling done re: condition, txmnt options and need for follow up. All questions of patient's were answered.   Gross side effects, risk and benefits, and alternatives of medications discussed with patient.  Patient is aware that all medications have potential side effects and we are unable to predict every sideeffect or drug-drug interaction that may occur.  Expresses verbal understanding and consents to  current therapy plan and treatment regiment.  Please see AVS handed out to patient at the end of our visit for additional patient instructions/ counseling done pertaining to today's office visit.  Note: This document was prepared using Dragon voice recognition software and may include unintentional dictation errors.   ----------------------------------------------------------------------------------------------------------------------  Subjective:   CC:   Andrea Floyd is a 66 y.o. female who presents to [facility] at [facility] today for review and discussion of recent bloodwork that was done.  1. All recent blood work that we ordered was reviewed with patient today.  Patient was counseled on all abnormalities and we discussed dietary and lifestyle changes that could help those values (also medications when appropriate).  Extensive health counseling performed and all patient's concerns/ questions were addressed.  2. Started pt on Lipitor last OV--> July 9th-. Tolerated medicine- No increased muscle aches or joint pains etc.  She was taking it nightly before bed however when she saw the follow-up blood work and saw that her liver enzymes were elevated, she just stopped the medicine around 08/11/17.  3. Bp-   last office  visit back in July we also Inc[facility] put her on losartan for her blood pressure.  Since they were not able to be scored and she was not able to cut them in half, patient did not start them.  Her blood pressures at home have been running in the 155-141 [phone]. No sob, vis changes, no ha, CP, no swelling in legs etc.  4. Having sx of UTI on and off since Sept.  Some pressure and urgency and some cloudiness of her urine.  Sx currently- NONE!!!    At [facility]- she took some OTC herbs.   No longer with sx. 5. Walks 5 d/week and for 20-88min   Wt Readings from Last 3 Encounters:  10/05/17 160 lb (72.6 kg)  07/05/17 160 lb (72.6 kg)  09/29/16 156 lb 6.4 oz (70.9 kg)    BP Readings from Last 3 Encounters:  10/05/17 (!) 150/87  07/05/17 (!) 146/83  09/29/16 136/89   Pulse Readings from Last 3 Encounters:  10/05/17 66  07/05/17 70  09/29/16 71   BMI Readings from Last 3 Encounters:  10/05/17 29.74 kg/m  07/05/17 29.74 kg/m  09/29/16 29.07 kg/m     Patient Care Team    Relationship Specialty Notifications Start End  Thomasene Lot, DO PCP - General Family Medicine  07/03/16     Full medical history updated and reviewed in the office today  Patient Active Problem List   Diagnosis Date Noted  . Mixed hyperlipidemia 07/05/2017  . Hypertriglyceridemia 07/05/2017  . Medication monitoring encounter 07/05/2017  . Inactivity 07/05/2017  . Tobacco use disorder 07/03/2016  . History of hypothyroidism 07/03/2016  . 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 Vitamin D insufficiency 07/03/2016  . Vitamin B insufficiency 07/03/2016  . Overweight (BMI 25.0-29.9) 07/03/2016  . History of TAH 07/03/2016  . Hypertension 08/24/2013  . SUI (stress urinary incontinence, female) 08/20/2011    Class: Chronic    Past Medical History:  Diagnosis Date  . 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 Hypertension   . SUI (stress urinary incontinence, female) 08/20/2011  . Tobacco use disorder 07/03/2016  . Vitamin D insufficiency 07/03/2016    Past Surgical History:  Procedure Laterality Date  . ABDOMINAL HYSTERECTOMY    . ANTERIOR AND POSTERIOR REPAIR  08/20/2011   Procedure: ANTERIOR (CYSTOCELE) AND POSTERIOR REPAIR (RECTOCELE);  Surgeon: Robley Fries;  Location: [facility];  Service: Gynecology;  Laterality: Bilateral;  With Cystoscopy  . BACK SURGERY    . BLADDER SUSPENSION  08/20/2011   Procedure: TRANSVAGINAL TAPE (TVT) PROCEDURE;  Surgeon: Robley Fries;  Location: [facility];  Service: Gynecology;  Laterality: Bilateral;  . CHOLECYSTECTOMY    . TONSILLECTOMY    . VAGINAL HYSTERECTOMY  08/20/2011    Procedure: HYSTERECTOMY VAGINAL;  Surgeon: Robley Fries;  Location: [facility];  Service: Gynecology;  Laterality: N/A;  With Removal Of Pessary.    Social History   Tobacco Use  . Smoking status: Current Every Day Smoker    Packs/day: 0.25    Types: Cigarettes  . Smokeless tobacco: Never Used  Substance Use Topics  . Alcohol use: Yes    Alcohol/week: 0.0 oz    Types: 1 - 2 Glasses of wine per week    Comment: daily    Family Hx: Family History  Problem Relation Age of Onset  . Hypertension Mother   . Alzheimer's disease Mother   . Diabetes Father   .  Hypertension Father   . Cancer Sister        colon  . Hypertension Brother   . Diabetes Son   . Hypertension Brother   . Hypertension Brother      Medications: Current Outpatient Medications  Medication Sig Dispense Refill  . amLODipine (NORVASC) 10 MG tablet Take 1 tablet (10 mg total) by mouth daily. 90 tablet 1  . aspirin 81 MG tablet Take 81 mg by mouth daily.    . B Complex-C-E-Zn (B COMPLEX-C-E-ZINC) tablet Take 1 tablet by mouth daily.    . cholecalciferol (VITAMIN D) 1000 units tablet Take 1,000 Units by mouth daily.    . Glucosamine 500 MG CAPS Take 1 each by mouth daily.      Marland Kitchen atorvastatin (LIPITOR) 40 MG tablet Take 1 tablet (40 mg total) by mouth at bedtime. 90 tablet 1  . olmesartan (BENICAR) 20 MG tablet Take 0.5 tablets (10 mg total) by mouth daily. 90 tablet 1   No current facility-administered medications for this visit.     Allergies:  Allergies  Allergen Reactions  . Codeine Shortness Of Breath  . Demerol Nausea And Vomiting     Review of Systems: General:   No F/C, wt loss Pulm:   No DIB, SOB, pleuritic chest pain Card:  No CP, palpitations Abd:  No n/v/d or pain Ext:  No inc edema from baseline  Objective:  Blood pressure (!) 150/87, pulse 66, height 5' 1.5" (1.562 m), weight 160 lb (72.6 kg). Body mass index is 29.74 kg/m. Gen:   Well NAD, A and O *3 HEENT:    [place]/AT, EOMI,   MMM Lungs:   Normal work of breathing. CTA B/L, no Wh, rhonchi Heart:   RRR, S1, S2 WNL's, no MRG Abd:   No gross distention Exts:    warm, pink,  Brisk capillary refill, warm and well perfused.  Psych:    No HI/SI, judgement and insight good, Euthymic mood. Full Affect.   No results found for this or any previous visit (from the past 2160 hour(s)).

## 2017-10-05 NOTE — Patient Instructions (Addendum)
- Patient will start her on losartan in addition to amlodipine.  She will keep check of what her blood pressures running at home and keep a log.  She'll bring this in next office visit.  She will let us know sooner than planned follow-up if she is having any problems  - Also patient will start the atorvastatin before bedtime.  Nothing to eat or drink after taking it.  We will recheck her cholesterol in about 3-4 months.  - If in the future you develop signs or symptoms of  a urinary tract infection or concerns of having one, please call the office and at the very least we can get you in just for a lab only urinalysis. preferably, we can get you in for an acute care visit with [facility] or I.     . How to Increase Your Level of Physical Activity  Getting regular physical activity is important for your overall health and well-being. Most people do not get enough exercise. There are easy ways to increase your level of physical activity, even if you have not been very active in the past or you are just starting out. Why is physical activity important? Physical activity has many short-term and long-term health benefits. Regular exercise can:  Help you lose weight or maintain a healthy weight.  Strengthen your muscles and bones.  Boost your mood and improve self-esteem.  Reduce your risk of certain long-term (chronic) diseases, like heart disease, cancer, and diabetes.  Help you stay capable of walking and moving around (mobile) as you age.  Prevent accidents, such as falls, as you age.  Increase life expectancy.  What are the benefits of being physically active on a regular basis? In addition to improving your physical health, being physically active on most days of the week can help you in ways that you may not expect. Benefits of regular physical activity may include:  Feeling good about your body.  Being able to move around more easily and for longer periods of time without getting tired  (increased stamina).  Finding new sources of fun and enjoyment.  Meeting new people who share a common interest.  Being able to fight off illness better (enhanced immunity).  Being able to sleep better.  What can happen if I am not physically active on a regular basis? Not getting enough physical activity can lead to an unhealthy lifestyle and future health problems. This can increase your chances of:  Becoming overweight or obese.  Becoming sick.  Developing chronic illnesses, like heart disease or diabetes.  Having mental health problems, like depression or anxiety.  Having sleep problems.  Having trouble walking or getting yourself around (reduced mobility).  Injuring yourself in a fall as you get older.  What steps can I take to be more physically active?  Check with your health care provider about how to get started. Ask your health care provider what activities are safe for you.  Start out slowly. Walking or doing some simple chair exercises is a good place to start, especially if you have not been active before or for a long time.  Try to find activities that you enjoy. You are more likely to commit to an exercise routine if it does not feel like a chore.  If you have bone or joint problems, choose low-impact exercises, like walking or swimming.  Include physical activity in your everyday routine.  Invite friends or family members to exercise with you. This also will help you commit  to your workout plan.  Set goals that you can work toward.  Aim for at least 150 minutes of moderate-intensity exercise each week. Examples of moderate-intensity exercise include walking or riding a bike. Where to find more information:  Centers for Disease Control and Prevention: JokeRule.co.uk  [employer] on [employer] www.http://smith-thompson.com/  ChooseMyPlate: MissedFlights.com.br Contact a health care  provider if:  You have headaches, muscle aches, or joint pain.  You feel dizzy or light-headed while exercising.  You faint.  You have chest pain while exercising. Summary  Exercise benefits your mind and body at any age, even if you are just starting out.  If you have a chronic illness or have not been active for a while, check with your health care provider before increasing your physical activity.  Choose activities that are safe and enjoyable for you.Ask your health care provider what activities are safe for you.  Start slowly. Tell your health care provider if you have problems as you start to increase your activity level. This information is not intended to replace advice given to you by your health care provider. Make sure you discuss any questions you have with your health care provider. Document Released: 12/03/2016 Document Revised: 12/03/2016 Document Reviewed: 12/03/2016 Elsevier Interactive Patient Education  [employer].

## 2018-01-07 ENCOUNTER — Other Ambulatory Visit: Payer: Self-pay | Admitting: Family Medicine

## 2018-01-07 DIAGNOSIS — I10 Essential (primary) hypertension: Secondary | ICD-10-CM

## 2018-02-07 ENCOUNTER — Other Ambulatory Visit: Payer: PPO

## 2018-02-08 ENCOUNTER — Other Ambulatory Visit: Payer: PPO

## 2018-02-08 DIAGNOSIS — E559 Vitamin D deficiency, unspecified: Secondary | ICD-10-CM

## 2018-02-08 DIAGNOSIS — E782 Mixed hyperlipidemia: Secondary | ICD-10-CM | POA: Diagnosis not present

## 2018-02-08 DIAGNOSIS — I10 Essential (primary) hypertension: Secondary | ICD-10-CM

## 2018-02-08 DIAGNOSIS — E539 Vitamin B deficiency, unspecified: Secondary | ICD-10-CM | POA: Diagnosis not present

## 2018-02-08 DIAGNOSIS — Z8639 Personal history of other endocrine, nutritional and metabolic disease: Secondary | ICD-10-CM

## 2018-02-08 DIAGNOSIS — E663 Overweight: Secondary | ICD-10-CM | POA: Diagnosis not present

## 2018-02-09 LAB — CBC WITH DIFFERENTIAL/PLATELET
BASOS ABS: 0 10*3/uL (ref 0.0–0.2)
Basos: 1 %
EOS (ABSOLUTE): 0.3 10*3/uL (ref 0.0–0.4)
Eos: 3 %
HEMOGLOBIN: 14 g/dL (ref 11.1–15.9)
Hematocrit: 40.8 % (ref 34.0–46.6)
IMMATURE GRANS (ABS): 0 10*3/uL (ref 0.0–0.1)
Immature Granulocytes: 0 %
LYMPHS: 29 %
Lymphocytes Absolute: 2.4 10*3/uL (ref 0.7–3.1)
MCH: 31.9 pg (ref 26.6–33.0)
MCHC: 34.3 g/dL (ref 31.5–35.7)
MCV: 93 fL (ref 79–97)
MONOCYTES: 8 %
Monocytes Absolute: 0.7 10*3/uL (ref 0.1–0.9)
NEUTROS PCT: 59 %
Neutrophils Absolute: 4.8 10*3/uL (ref 1.4–7.0)
PLATELETS: 346 10*3/uL (ref 150–379)
RBC: 4.39 x10E6/uL (ref 3.77–5.28)
RDW: 13.9 % (ref 12.3–15.4)
WBC: 8.1 10*3/uL (ref 3.4–10.8)

## 2018-02-09 LAB — VITAMIN B12: VITAMIN B 12: 294 pg/mL (ref 232–1245)

## 2018-02-09 LAB — COMPREHENSIVE METABOLIC PANEL
A/G RATIO: 2 (ref 1.2–2.2)
ALK PHOS: 67 IU/L (ref 39–117)
ALT: 28 IU/L (ref 0–32)
AST: 23 IU/L (ref 0–40)
Albumin: 4.7 g/dL (ref 3.6–4.8)
BUN/Creatinine Ratio: 20 (ref 12–28)
BUN: 16 mg/dL (ref 8–27)
Bilirubin Total: 0.9 mg/dL (ref 0.0–1.2)
CALCIUM: 9.6 mg/dL (ref 8.7–10.3)
CHLORIDE: 99 mmol/L (ref 96–106)
CO2: 22 mmol/L (ref 20–29)
Creatinine, Ser: 0.8 mg/dL (ref 0.57–1.00)
GFR calc Af Amer: 88 mL/min/{1.73_m2} (ref >59)
GFR calc non Af Amer: 76 mL/min/{1.73_m2} (ref >59)
GLOBULIN, TOTAL: 2.4 g/dL (ref 1.5–4.5)
Glucose: 86 mg/dL (ref 65–99)
POTASSIUM: 5 mmol/L (ref 3.5–5.2)
SODIUM: 139 mmol/L (ref 134–144)
Total Protein: 7.1 g/dL (ref 6.0–8.5)

## 2018-02-09 LAB — LIPID PANEL
CHOLESTEROL TOTAL: 182 mg/dL (ref 100–199)
Chol/HDL Ratio: 2.9 ratio (ref 0.0–4.4)
HDL: 63 mg/dL (ref >39)
LDL Calculated: 90 mg/dL (ref 0–99)
TRIGLYCERIDES: 147 mg/dL (ref 0–149)
VLDL Cholesterol Cal: 29 mg/dL (ref 5–40)

## 2018-02-09 LAB — VITAMIN D 25 HYDROXY (VIT D DEFICIENCY, FRACTURES): VIT D 25 HYDROXY: 39.7 ng/mL (ref 30.0–100.0)

## 2018-02-09 LAB — HEMOGLOBIN A1C
ESTIMATED AVERAGE GLUCOSE: 120 mg/dL
Hgb A1c MFr Bld: 5.8 % — ABNORMAL HIGH (ref 4.8–5.6)

## 2018-02-09 LAB — TSH: TSH: 5.66 u[IU]/mL — ABNORMAL HIGH (ref 0.450–4.500)

## 2018-02-14 ENCOUNTER — Ambulatory Visit (INDEPENDENT_AMBULATORY_CARE_PROVIDER_SITE_OTHER): Payer: PPO | Admitting: Family Medicine

## 2018-02-14 ENCOUNTER — Encounter: Payer: Self-pay | Admitting: Family Medicine

## 2018-02-14 VITALS — BP 117/76 | HR 73 | Ht 61.5 in | Wt 162.0 lb

## 2018-02-14 DIAGNOSIS — R7303 Prediabetes: Secondary | ICD-10-CM | POA: Diagnosis not present

## 2018-02-14 DIAGNOSIS — Z8639 Personal history of other endocrine, nutritional and metabolic disease: Secondary | ICD-10-CM | POA: Diagnosis not present

## 2018-02-14 DIAGNOSIS — E782 Mixed hyperlipidemia: Secondary | ICD-10-CM | POA: Diagnosis not present

## 2018-02-14 DIAGNOSIS — Z6830 Body mass index (BMI) 30.0-30.9, adult: Secondary | ICD-10-CM

## 2018-02-14 DIAGNOSIS — Z833 Family history of diabetes mellitus: Secondary | ICD-10-CM | POA: Diagnosis not present

## 2018-02-14 DIAGNOSIS — R6889 Other general symptoms and signs: Secondary | ICD-10-CM

## 2018-02-14 DIAGNOSIS — E559 Vitamin D deficiency, unspecified: Secondary | ICD-10-CM | POA: Diagnosis not present

## 2018-02-14 DIAGNOSIS — E539 Vitamin B deficiency, unspecified: Secondary | ICD-10-CM

## 2018-02-14 DIAGNOSIS — F17200 Nicotine dependence, unspecified, uncomplicated: Secondary | ICD-10-CM

## 2018-02-14 DIAGNOSIS — I10 Essential (primary) hypertension: Secondary | ICD-10-CM | POA: Diagnosis not present

## 2018-02-14 DIAGNOSIS — E781 Pure hyperglyceridemia: Secondary | ICD-10-CM

## 2018-02-14 DIAGNOSIS — E039 Hypothyroidism, unspecified: Secondary | ICD-10-CM | POA: Insufficient documentation

## 2018-02-14 DIAGNOSIS — Z789 Other specified health status: Secondary | ICD-10-CM

## 2018-02-14 MED ORDER — LEVOTHYROXINE SODIUM 25 MCG PO TABS: 25.0000 ug | ORAL_TABLET | Freq: Every day | ORAL | 1 refills | Status: DC

## 2018-02-14 NOTE — Patient Instructions (Addendum)
In 6 weeks, recheck thyroid labs with Lab Only office visit. In four months, come in for office visit to re-check A1C. If you have any problems with the medicine before then, please call.   Risk factors for prediabetes and type 2 diabetes  Researchers don't fully understand why some people develop prediabetes and type 2 diabetes and others don't.  It's clear that certain factors increase the risk, however, including:  Weight. The more fatty tissue you have, the more resistant your cells become to insulin.  Inactivity. The less active you are, the greater your risk. Physical activity helps you control your weight, uses up glucose as energy and makes your cells more sensitive to insulin.  Family history. Your risk increases if a parent or sibling has type 2 diabetes.  Race. Although it's unclear why, people of certain races -- including blacks, Hispanics, American Indians and Asian-Americans -- are at higher risk.  Age. Your risk increases as you get older. This may be because you tend to exercise less, lose muscle mass and gain weight as you age. But type 2 diabetes is also increasing dramatically among children, adolescents and younger adults.  Gestational diabetes. If you developed gestational diabetes when you were pregnant, your risk of developing prediabetes and type 2 diabetes later increases. If you gave birth to a baby weighing more than 9 pounds (4 kilograms), you're also at risk of type 2 diabetes.  Polycystic ovary syndrome. For women, having polycystic ovary syndrome -- a common condition characterized by irregular menstrual periods, excess hair growth and obesity -- increases the risk of diabetes.  High blood pressure. Having blood pressure over 140/90 millimeters of mercury (mm Hg) is linked to an increased risk of type 2 diabetes.  Abnormal cholesterol and triglyceride levels. If you have low levels of high-density lipoprotein (HDL), or "good," cholesterol, your risk of type 2  diabetes is higher. Triglycerides are another type of fat carried in the blood. People with high levels of triglycerides have an increased risk of type 2 diabetes. Your doctor can let you know what your cholesterol and triglyceride levels are.  A good guide to good carbs: The glycemic index ---If you have diabetes, or at risk for diabetes, you know all too well that when you eat carbohydrates, your blood sugar goes up. The total amount of carbs you consume at a meal or in a snack mostly determines what your blood sugar will do. But the food itself also plays a role. A serving of white rice has almost the same effect as eating pure table sugar -- a quick, high spike in blood sugar. A serving of lentils has a slower, smaller effect.  ---Picking good sources of carbs can help you control your blood sugar and your weight. Even if you don't have diabetes, eating healthier carbohydrate-rich foods can help ward off a host of chronic conditions, from heart disease to various cancers to, well, diabetes.  ---One way to choose foods is with the glycemic index (GI). This tool measures how much a food boosts blood sugar.  The glycemic index rates the effect of a specific amount of a food on blood sugar compared with the same amount of pure glucose. A food with a glycemic index of 28 boosts blood sugar only 28% as much as pure glucose. One with a GI of 95 acts like pure glucose.    High glycemic foods result in a quick spike in insulin and blood sugar (also known as blood glucose).  Low glycemic  foods have a slower, smaller effect- these are healthier for you.   Using the glycemic index Using the glycemic index is easy: choose foods in the low GI category instead of those in the high GI category (see below), and go easy on those in between. Low glycemic index (GI of 55 or less): Most fruits and vegetables, beans, minimally processed grains, pasta, low-fat dairy foods, and nuts.  Moderate glycemic index (GI 56 to  69): White and sweet potatoes, corn, white rice, couscous, breakfast cereals such as Cream of Wheat and Mini Wheats.  High glycemic index (GI of 70 or higher): White bread, rice cakes, most crackers, bagels, cakes, doughnuts, croissants, most packaged breakfast cereals. You can see the values for 100 commons foods and get links to more at www.health.RecordDebt.hu.  Swaps for lowering glycemic index  Instead of this high-glycemic index food Eat this lower-glycemic index food  White rice Brown rice or converted rice  Instant oatmeal Steel-cut oats  Cornflakes Bran flakes  Baked potato Pasta, bulgur  White bread Whole-grain bread  Corn Peas or leafy greens       Prediabetes Eating Plan  Prediabetes--also called impaired glucose tolerance or impaired fasting glucose--is a condition that causes blood sugar (blood glucose) levels to be higher than normal. Following a healthy diet can help to keep prediabetes under control. It can also help to lower the risk of type 2 diabetes and heart disease, which are increased in people who have prediabetes. Along with regular exercise, a healthy diet:  Promotes weight loss.  Helps to control blood sugar levels.  Helps to improve the way that the body uses insulin.   WHAT DO I NEED TO KNOW ABOUT THIS EATING PLAN?   Use the glycemic index (GI) to plan your meals. The index tells you how quickly a food will raise your blood sugar. Choose low-GI foods. These foods take a longer time to raise blood sugar.  Pay close attention to the amount of carbohydrates in the food that you eat. Carbohydrates increase blood sugar levels.  Keep track of how many calories you take in. Eating the right amount of calories will help you to achieve a healthy weight. Losing about 7 percent of your starting weight can help to prevent type 2 diabetes.  You may want to follow a Mediterranean diet. This diet includes a lot of vegetables, lean meats or fish, whole  grains, fruits, and healthy oils and fats.   WHAT FOODS CAN I EAT?  Grains Whole grains, such as whole-wheat or whole-grain breads, crackers, cereals, and pasta. Unsweetened oatmeal. Bulgur. Barley. Quinoa. Brown rice. Corn or whole-wheat flour tortillas or taco shells. Vegetables Lettuce. Spinach. Peas. Beets. Cauliflower. Cabbage. Broccoli. Carrots. Tomatoes. Squash. Eggplant. Herbs. Peppers. Onions. Cucumbers. Brussels sprouts. Fruits Berries. Bananas. Apples. Oranges. Grapes. Papaya. Mango. Pomegranate. Kiwi. Grapefruit. Cherries. Meats and Other Protein Sources Seafood. Lean meats, such as chicken and [place] or lean cuts of pork and beef. Tofu. Eggs. Nuts. Beans. Dairy Low-fat or fat-free dairy products, such as yogurt, cottage cheese, and cheese. Beverages Water. Tea. Coffee. Sugar-free or diet soda. Seltzer water. Milk. Milk alternatives, such as soy or almond milk. Condiments Mustard. Relish. Low-fat, low-sugar ketchup. Low-fat, low-sugar barbecue sauce. Low-fat or fat-free mayonnaise. Sweets and Desserts Sugar-free or low-fat pudding. Sugar-free or low-fat ice cream and other frozen treats. Fats and Oils Avocado. Walnuts. Olive oil. The items listed above may not be a complete list of recommended foods or beverages. Contact your dietitian for more options.  WHAT FOODS ARE NOT RECOMMENDED?  Grains Refined white flour and flour products, such as bread, pasta, snack foods, and cereals. Beverages Sweetened drinks, such as sweet iced tea and soda. Sweets and Desserts Baked goods, such as cake, cupcakes, pastries, cookies, and cheesecake. The items listed above may not be a complete list of foods and beverages to avoid. Contact your dietitian for more information.   This information is not intended to replace advice given to you by your health care provider. Make sure you discuss any questions you have with your health care provider.   Document Released: 04/30/2015 Document  Reviewed: 04/30/2015 Elsevier Interactive Patient Education [employer]2016 [employer] Inc.

## 2018-02-14 NOTE — Progress Notes (Signed)
Assessment and plan:  1. Essential hypertension   2. Mixed hyperlipidemia   3. Prediabetes   4. Vitamin D insufficiency   5. Vitamin B insufficiency   6. BMI 30.0-30.9,adult   7. Hypertriglyceridemia   8. History of hypothyroidism   9. Alcohol consumption of more than two drinks per day   10. Tobacco use disorder   11. Family history of diabetes mellitus in father- in 25's   12. Hypothyroidism, unspecified type   13. Cold intolerance     1. Recent lab work (02/11 - 02/08/2018) was reviewed in detail with the patient today.  - Patient will continue on all existing medications as prescribed.  2. Essential HTN - Blood pressure looks good today. - Will continue to monitor.  3. Mixed HLD - Cholesterol has improved since a year ago. - Continue on her statin as prescribed. - Discussed that her triglycerides went down, which is good. - HDL went up to 63.  Reviewed that >60 is an excellent count for HDL. - Recommended eating less fatty carbs, less alcohol intake. - Encouraged further exercise to increase HDL - Will continue to monitor.  4. A1C - Prediabetes - Reviewed that this is a 2-3 month average that screens for diabetes. - Talked about the goal to avoid an A1C of 6.4. - A1C six days ago was 5.8. - Handout provided on low carbohydrate/low glycemic index foods. - Discussed the importance of weight management correlated to disease management. - Should re-check A1C in 4 months. - Fasting sugar is currently normal. - Will continue to monitor.   5. Vitamin B12 - In the past, low normal range. - Still in the low range. - Patient takes B12 complex, and per patient, reports good compliance. - Will continue to monitor.  6. Thyroid - Discussed the nature of the thyroid and thyroid hormones. - Reviewed that the depleted thyroid hormones could be temporary. - Need to check individual T3 and T4. - Patient has a  history of hypothyroid 20 years ago. - Noted that hypothyroid can make weight regulation hard. - Because she has symptoms of hypothyroidism, cold intolerance, we are going to begin a low dose of synthroid. - Starting at 25 micrograms. - Re-check in 6 weeks to make sure everything looks good, and levels are normal.   7. Blood Counts - In the past, her hemoglobin has been high.  Counts look perfect today. - Recommended that she cut back on smoking to help regulate her blood counts. - Discussed that the fact that she's cut back on smoking has helped her hemoglobin. - WBC and platelet counts are good, mature and immature cells look good.  8. Liver & Kidney - Function is perfect.  Enzymes are good. - Don't have to worry about adjusting her dose of statins - Takes the 40 atorvastatin at bedtime. - Recommended that the patient drink more water.  9. General Health Maintenance - Advised patient to continue working toward exercising to improve health.   - Will continue to monitor her use of alcohol and tobacco.   - Encouraged the patient to completely stop smoking, since she is already smoking very little. - Patient recently got out of the routine of walking, but plans to resume.  - PT will begin with 15 minutes of activity daily.  When she resumes walking, she should avoid concrete, avoid up and down hills, and begin on gravel or forgiving surfaces.  Recommended that the patient eventually strive for at  least 150 minutes of cardio per week according to guidelines established by the AHA.    - To avoid aggravating her knee arthritis, patient should begin walking on track and flat ground.  May also use elliptical, or swim to help watch the pain in her knee.  Knee Arthritis - Advised the patient to ice her knee arthritis for 15-20 minutes, 3-4 times per day, as much as needed.  Patient knows to avoid aggravating activities.  - Healthy dietary habits encouraged, including low-carb, and high amounts  of lean protein in diet.   - Patient should also consume adequate amounts of water - half of body weight in oz of water per day  Explained to patient what BMI refers to, and what it means medically.    Told patient to think about it as a "medical risk stratification measurement" and how increasing BMI is associated with increasing risk/ or worsening state of various diseases such as hypertension, hyperlipidemia, diabetes, premature OA, depression etc.  American Heart Association guidelines for healthy diet, basically Mediterranean diet, and exercise guidelines of 30 minutes 5 days per week or more discussed in detail.  Health counseling performed.  All questions answered.  10. Follow up - 6 weeks lab only visit to check her thyroid levels. - Prediabetes 23-month A1C re-screen. - Weight check in 4 months.   Education and routine counseling performed. Handouts provided.  Orders Placed This Encounter  Procedures  . TSH+T4F+T3Free    Meds ordered this encounter  Medications  . levothyroxine (SYNTHROID, LEVOTHROID) 25 MCG tablet    Sig: Take 1 tablet (25 mcg total) by mouth daily.    Dispense:  90 tablet    Refill:  1     Return in about 4 months (around 06/14/2018) for 6 wks- lab only;  OV w/  a1c in 4 mo, wt check.   Anticipatory guidance and routine counseling done re: condition, txmnt options and need for follow up. All questions of patient's were answered.   Gross side effects, risk and benefits, and alternatives of medications discussed with patient.  Patient is aware that all medications have potential side effects and we are unable to predict every sideeffect or drug-drug interaction that may occur.  Expresses verbal understanding and consents to current therapy plan and treatment regiment.  Please see AVS handed out to patient at the end of our visit for additional patient instructions/ counseling done pertaining to today's office visit.  Note: This document was prepared  using Dragon voice recognition software and may include unintentional dictation errors.    This document serves as a record of services personally performed by DMellody Dance DO. It was created on her behalf by KToni Amend a trained medical scribe. The creation of this record is based on the scribe's personal observations and the provider's statements to them.   I have reviewed the above medical documentation for accuracy and completeness and I concur.  DMellody Dance02/18/19 12:31 PM   ----------------------------------------------------------------------------------------------------------------------  Subjective:   CC:   Andrea DICKISONis a 67y.o. [place] [facility] and discussion of recent bloodwork that was done.  1. All recent blood work that we ordered was reviewed with patient today.  Patient was counseled on all abnormalities and we discussed dietary and lifestyle changes that could help those values (also medications when appropriate).  Extensive health counseling performed and all patient's concerns/ questions were addressed.   Overall she has been  very well.  Her main concern today is with regard to her TSH.  Thyroid Lately she's been feeling symptomatic - noting cold intolerance. She has some bilateral hand tingling as well, noting that it happens a lot at night. Her hands go to sleep.  Sometimes she feels it while driving as well. It's been 20 years since she took synthroid.  Tobacco Use She notes that she hasn't had anything to smoke for four days.  She smokes occassionally.  Alcohol Use Drinks about two glasses of wine per day.  She enjoys white wine, sauvignon blanc, chardonnay.  Prediabetes Father was diabetic, adult onset. He had a strong family history, was in his 41's when he became diabetic. Patient notes that she does like her vegetables.  Knee Daughter had a baby in November  and she's been going up and down the stairs at her daughter's house often recently, to help take care of her.  Notes that her knee has been hurting lately.  Wt Readings from Last 3 Encounters:  02/14/18 162 lb (73.5 kg)  10/05/17 160 lb (72.6 kg)  07/05/17 160 lb (72.6 kg)   BP Readings from Last 3 Encounters:  02/14/18 117/76  10/05/17 (!) 150/87  07/05/17 (!) 146/83   Pulse Readings from Last 3 Encounters:  02/14/18 73  10/05/17 66  07/05/17 70   BMI Readings from Last 3 Encounters:  02/14/18 30.12 kg/m  10/05/17 29.74 kg/m  07/05/17 29.74 kg/m     Patient Care Team    Relationship Specialty Notifications Start End  Mellody Dance, DO PCP - General Family Medicine  07/03/16     Full medical history updated and reviewed in the office today  Patient Active Problem List   Diagnosis Date Noted  . Family history of diabetes mellitus in father- in 65's 02/14/2018  . Prediabetes 02/14/2018  . Hypothyroidism 02/14/2018  . Mixed hyperlipidemia 07/05/2017  . Hypertriglyceridemia 07/05/2017  . Medication monitoring encounter 07/05/2017  . Inactivity 07/05/2017  . Tobacco use disorder 07/03/2016  . History of hypothyroidism 07/03/2016  . 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 Vitamin D insufficiency 07/03/2016  . Vitamin B insufficiency 07/03/2016  . Overweight (BMI 25.0-29.9) 07/03/2016  . History of TAH 07/03/2016  . Hypertension 08/24/2013  . SUI (stress urinary incontinence, female) 08/20/2011    Class: Chronic    Past Medical History:  Diagnosis Date  . 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 Hypertension   . SUI (stress urinary incontinence, female) 08/20/2011  . Tobacco use disorder 07/03/2016  . Vitamin D insufficiency 07/03/2016    Past Surgical History:  Procedure Laterality Date  . ABDOMINAL HYSTERECTOMY    . ANTERIOR AND POSTERIOR REPAIR  08/20/2011   Procedure: ANTERIOR (CYSTOCELE) AND POSTERIOR  REPAIR (RECTOCELE);  Surgeon: Elveria Royals;  Location: [facility];  Service: Gynecology;  Laterality: Bilateral;  With Cystoscopy  . BACK SURGERY    . BLADDER SUSPENSION  08/20/2011   Procedure: TRANSVAGINAL TAPE (TVT) PROCEDURE;  Surgeon: Elveria Royals;  Location: [facility];  Service: Gynecology;  Laterality: Bilateral;  . CHOLECYSTECTOMY    . TONSILLECTOMY    . VAGINAL HYSTERECTOMY  08/20/2011   Procedure: HYSTERECTOMY VAGINAL;  Surgeon: Elveria Royals;  Location: [facility];  Service: Gynecology;  Laterality: N/A;  With Removal Of Pessary.    Social History   Tobacco Use  . Smoking status: Current Every Day Smoker    Packs/day: 0.25  Types: Cigarettes  . Smokeless tobacco: Never Used  Substance Use Topics  . Alcohol use: Yes    Alcohol/week: 0.0 oz    Types: 1 - 2 Glasses of wine per week    Comment: daily    Family Hx: Family History  Problem Relation Age of Onset  . Hypertension Mother   . Alzheimer's disease Mother   . Diabetes Father   . Hypertension Father   . Cancer Sister        colon  . Hypertension Brother   . Diabetes Son   . Hypertension Brother   . Hypertension Brother      Medications: Current Outpatient Medications  Medication Sig Dispense Refill  . amLODipine (NORVASC) 10 MG tablet TAKE 1 TABLET BY MOUTH DAILY. (NEEDS APPOINTMENT) 90 tablet 1  . aspirin 81 MG tablet Take 81 mg by mouth daily.    Marland Kitchen atorvastatin (LIPITOR) 40 MG tablet Take 1 tablet (40 mg total) by mouth at bedtime. 90 tablet 1  . B Complex-C-E-Zn (B COMPLEX-C-E-ZINC) tablet Take 1 tablet by mouth daily.    . cholecalciferol (VITAMIN D) 1000 units tablet Take 1,000 Units by mouth daily.    . Glucosamine 500 MG CAPS Take 1 each by mouth daily.      . Magnesium 250 MG TABS Take 1 tablet by mouth daily.    Marland Kitchen olmesartan (BENICAR) 20 MG tablet Take 0.5 tablets (10 mg total) by mouth daily. 90 tablet 1  . levothyroxine (SYNTHROID, LEVOTHROID) 25 MCG tablet Take 1 tablet (25 mcg total) by mouth  daily. 90 tablet 1   No current facility-administered medications for this visit.     Allergies:  Allergies  Allergen Reactions  . Codeine Shortness Of Breath  . Demerol Nausea And Vomiting     Review of Systems: General:   No F/C, wt loss Pulm:   No DIB, SOB, pleuritic chest pain Card:  No CP, palpitations Abd:  No n/v/d or pain Ext:  No inc edema from baseline  Objective:  Blood pressure 117/76, pulse 73, height 5' 1.5" (1.562 m), weight 162 lb (73.5 kg), SpO2 98 %. Body mass index is 30.12 kg/m. Gen:   Well NAD, A and O *3 HEENT:    [place]/AT, EOMI,  MMM Lungs:   Normal work of breathing. CTA B/L, no Wh, rhonchi Heart:   RRR, S1, S2 WNL's, no MRG Abd:   No gross distention Exts:    warm, pink,  Brisk capillary refill, warm and well perfused.  Psych:    No HI/SI, judgement and insight good, Euthymic mood. Full Affect.   Recent Results (from the past 2160 hour(s))  CBC w/Diff     Status: None   Collection Time: 02/08/18  9:24 AM  Result Value Ref Range   WBC 8.1 3.4 - 10.8 x10E3/uL   RBC 4.39 3.77 - 5.28 x10E6/uL   Hemoglobin 14.0 11.1 - 15.9 g/dL   Hematocrit 40.8 34.0 - 46.6 %   MCV 93 79 - 97 fL   MCH 31.9 26.6 - 33.0 pg   MCHC 34.3 31.5 - 35.7 g/dL   RDW 13.9 12.3 - 15.4 %   Platelets 346 150 - 379 x10E3/uL   Neutrophils 59 Not Estab. %   Lymphs 29 Not Estab. %   Monocytes 8 Not Estab. %   Eos 3 Not Estab. %   Basos 1 Not Estab. %   Neutrophils Absolute 4.8 1.4 - 7.0 x10E3/uL   Lymphocytes Absolute 2.4 0.7 - 3.1 x10E3/uL  Monocytes Absolute 0.7 0.1 - 0.9 x10E3/uL   EOS (ABSOLUTE) 0.3 0.0 - 0.4 x10E3/uL   Basophils Absolute 0.0 0.0 - 0.2 x10E3/uL   Immature Granulocytes 0 Not Estab. %   Immature Grans (Abs) 0.0 0.0 - 0.1 x10E3/uL  B12     Status: None   Collection Time: 02/08/18  9:24 AM  Result Value Ref Range   Vitamin B-12 294 232 - 1,245 pg/mL  Comp Met (CMET)     Status: None   Collection Time: 02/08/18  9:24 AM  Result Value Ref Range   Glucose  86 65 - 99 mg/dL   BUN 16 8 - 27 mg/dL   Creatinine, Ser 0.80 0.57 - 1.00 mg/dL   GFR calc non Af Amer 76 >59 mL/min/1.73   GFR calc Af Amer 88 >59 mL/min/1.73   BUN/Creatinine Ratio 20 12 - 28   Sodium 139 134 - 144 mmol/L   Potassium 5.0 3.5 - 5.2 mmol/L   Chloride 99 96 - 106 mmol/L   CO2 22 20 - 29 mmol/L   Calcium 9.6 8.7 - 10.3 mg/dL   Total Protein 7.1 6.0 - 8.5 g/dL   Albumin 4.7 3.6 - 4.8 g/dL   Globulin, Total 2.4 1.5 - 4.5 g/dL   Albumin/Globulin Ratio 2.0 1.2 - 2.2   Bilirubin Total 0.9 0.0 - 1.2 mg/dL   Alkaline Phosphatase 67 39 - 117 IU/L   AST 23 0 - 40 IU/L   ALT 28 0 - 32 IU/L  Lipid Profile     Status: None   Collection Time: 02/08/18  9:24 AM  Result Value Ref Range   Cholesterol, Total 182 100 - 199 mg/dL   Triglycerides 147 0 - 149 mg/dL   HDL 63 >39 mg/dL   VLDL Cholesterol Cal 29 5 - 40 mg/dL   LDL Calculated 90 0 - 99 mg/dL   Chol/HDL Ratio 2.9 0.0 - 4.4 ratio    Comment:                                   T. Chol/HDL Ratio                                             Men  Women                               1/2 Avg.Risk  3.4    3.3                                   Avg.Risk  5.0    4.4                                2X Avg.Risk  9.6    7.1                                3X Avg.Risk 23.4   11.0   TSH     Status: Abnormal   Collection Time: 02/08/18  9:24 AM  Result Value Ref Range   TSH 5.660 (H)  0.450 - 4.500 uIU/mL  HgB A1c     Status: Abnormal   Collection Time: 02/08/18  9:24 AM  Result Value Ref Range   Hgb A1c MFr Bld 5.8 (H) 4.8 - 5.6 %    Comment:          Prediabetes: 5.7 - 6.4          Diabetes: >6.4          Glycemic control for adults with diabetes: <7.0    Est. average glucose Bld gHb Est-mCnc 120 mg/dL  Vitamin D (25 hydroxy)     Status: None   Collection Time: 02/08/18  9:24 AM  Result Value Ref Range   Vit D, 25-Hydroxy 39.7 30.0 - 100.0 ng/mL    Comment: Vitamin D deficiency has been defined by the [place] and  an Endocrine Society practice guideline as a level of serum 25-OH vitamin D less than 20 ng/mL (1,2). The Endocrine Society went on to further define vitamin D insufficiency as a level between 21 and 29 ng/mL (2). 1. IOM (Institute of Medicine). 2010. Dietary reference    intakes for calcium and D. [place]: The    [employer]. 2. Holick MF, Binkley [place], Bischoff-Ferrari HA, et al.    Evaluation, treatment, and prevention of vitamin D    deficiency: an Endocrine Society clinical practice    guideline. JCEM. 2011 Jul; 96(7):1911-30.

## 2018-03-07 DIAGNOSIS — Z1231 Encounter for screening mammogram for malignant neoplasm of breast: Secondary | ICD-10-CM | POA: Diagnosis not present

## 2018-03-07 DIAGNOSIS — Z803 Family history of malignant neoplasm of breast: Secondary | ICD-10-CM | POA: Diagnosis not present

## 2018-03-30 ENCOUNTER — Other Ambulatory Visit: Payer: PPO

## 2018-04-11 ENCOUNTER — Other Ambulatory Visit: Payer: Self-pay | Admitting: Family Medicine

## 2018-04-11 DIAGNOSIS — E782 Mixed hyperlipidemia: Secondary | ICD-10-CM

## 2018-04-11 DIAGNOSIS — E781 Pure hyperglyceridemia: Secondary | ICD-10-CM

## 2018-04-14 ENCOUNTER — Other Ambulatory Visit: Payer: PPO

## 2018-04-14 DIAGNOSIS — E039 Hypothyroidism, unspecified: Secondary | ICD-10-CM

## 2018-04-14 DIAGNOSIS — R6889 Other general symptoms and signs: Secondary | ICD-10-CM

## 2018-04-15 LAB — TSH+T4F+T3FREE
Free T4: 0.96 ng/dL (ref 0.82–1.77)
T3, Free: 2.8 pg/mL (ref 2.0–4.4)
TSH: 4.18 u[IU]/mL (ref 0.450–4.500)

## 2018-06-14 ENCOUNTER — Ambulatory Visit (INDEPENDENT_AMBULATORY_CARE_PROVIDER_SITE_OTHER): Payer: PPO | Admitting: Family Medicine

## 2018-06-14 ENCOUNTER — Encounter: Payer: Self-pay | Admitting: Family Medicine

## 2018-06-14 VITALS — BP 116/75 | HR 69 | Ht 61.5 in | Wt 167.0 lb

## 2018-06-14 DIAGNOSIS — Z6830 Body mass index (BMI) 30.0-30.9, adult: Secondary | ICD-10-CM

## 2018-06-14 DIAGNOSIS — R7303 Prediabetes: Secondary | ICD-10-CM | POA: Diagnosis not present

## 2018-06-14 DIAGNOSIS — E039 Hypothyroidism, unspecified: Secondary | ICD-10-CM

## 2018-06-14 DIAGNOSIS — E782 Mixed hyperlipidemia: Secondary | ICD-10-CM | POA: Diagnosis not present

## 2018-06-14 DIAGNOSIS — I10 Essential (primary) hypertension: Secondary | ICD-10-CM | POA: Diagnosis not present

## 2018-06-14 NOTE — Progress Notes (Signed)
Impression and Recommendations:    1. Essential hypertension   2. Mixed hyperlipidemia   3. Prediabetes   4. BMI 30.0-30.9,adult   5. Hypothyroidism, unspecified type     1. Essential HTN -BP under great control and sx stable. Check BP at home and keep a log. Bring this into next OV.  -continue meds as listed below.   2. Mixed HLD -Recheck labs next OV. Prudent diet/exercise.  -continue meds as listed below.  3. Prediabetes -A1c under great control, it was 5.8 on 02-08-18. Reduce intake of sweets and carbohydrates.  -prudent diet/exercise discussed. -recheck a1c next OV.  4. BMI 30-30.9 -pt is up 5 lb since last OV 02-08-18. Continue diet/exercise.  -walk on a forgiving, flat surface.    5. Hypothyroidism -TSH, T3, T4 all WNL last checked 04-14-18. Sx stable.  -Continue meds as listed below.     No orders of the defined types were placed in this encounter.   No orders of the defined types were placed in this encounter.   Gross side effects, risk and benefits, and alternatives of medications and treatment plan in general discussed with patient.  Patient is aware that all medications have potential side effects and we are unable to predict every side effect or drug-drug interaction that may occur.   Patient will call with any questions prior to using medication if they have concerns.  Expresses verbal understanding and consents to current therapy and treatment regimen.  No barriers to understanding were identified.  Red flag symptoms and signs discussed in detail.  Patient expressed understanding regarding what to do in case of emergency\urgent symptoms  Please see AVS handed out to patient at the end of our visit for further patient instructions/ counseling done pertaining to today's office visit.   Return for Follow-up 3 months for blood pressure recheck, A1c recheck for prediabetes, weight check.    Note: This note was prepared with assistance of Dragon voice  recognition software. Occasional wrong-word or sound-a-like substitutions may have occurred due to the inherent limitations of voice recognition software.   This document serves as a record of services personally performed by Thomasene Lot, DO. It was created on her behalf by Thelma Barge, a trained medical scribe. The creation of this record is based on the scribe's personal observations and the provider's statements to them.   I have reviewed the above medical documentation for accuracy and completeness and I concur.  Thomasene Lot 06/14/18 9:25 AM  --------------------------------------------------------------------------------------------------------------------------------------------------------------------------------------------------------------------------------------------    Subjective:     HPI: Andrea Floyd is a 67 y.o. female who presents to [facility] today for issues as discussed below.   Wt Weight is up 5 lbs since 02-14-18.  Prediabetes A1c was 5.8 on 02-08-18 and BS have been under great control.   She has been exercising and walking somewhat, but is limited because of her R knee. Currently she declines workup on further evaluation of this knee- it is not "that bad yet".   BP remains stable at home.   HTN HPI:  -  Her blood pressure has been controlled at home.  Pt is checking it at home.   BP's have been:  137/83, 125/74, 139/79, 125/74, 127/75, 131/75  - Patient reports good compliance with blood pressure medications  - Denies medication S-E   - Smoking Status noted   - She denies new onset of: chest pain, exercise intolerance, shortness of breath, dizziness, visual changes, headache,  lower extremity swelling or claudication.   She feels lightheaded after taking her BP medications in the mornings.    She [facility]   Last 3 blood pressure readings in our office  are as follows: BP Readings from Last 3 Encounters:  06/14/18 116/75  02/14/18 117/76  10/05/17 (!) 150/87    Filed Weights   06/14/18 0856  Weight: 167 lb (75.8 kg)    Wt Readings from Last 3 Encounters:  06/14/18 167 lb (75.8 kg)  02/14/18 162 lb (73.5 kg)  10/05/17 160 lb (72.6 kg)   BP Readings from Last 3 Encounters:  06/14/18 116/75  02/14/18 117/76  10/05/17 (!) 150/87   Pulse Readings from Last 3 Encounters:  06/14/18 69  02/14/18 73  10/05/17 66   BMI Readings from Last 3 Encounters:  06/14/18 31.05 kg/m  02/14/18 30.12 kg/m  10/05/17 29.74 kg/m     Patient Care Team    Relationship Specialty Notifications Start End  Thomasene Lotpalski, Jacobb Alen, DO PCP - General Family Medicine  07/03/16      Patient Active Problem List   Diagnosis Date Noted  . BMI 30.0-30.9,adult 06/14/2018  . Essential hypertension 06/14/2018  . Family history of diabetes mellitus in father- in 6140's 02/14/2018  . Prediabetes 02/14/2018  . Hypothyroidism 02/14/2018  . Mixed hyperlipidemia 07/05/2017  . Hypertriglyceridemia 07/05/2017  . Medication monitoring encounter 07/05/2017  . Inactivity 07/05/2017  . Tobacco use disorder 07/03/2016  . History of hypothyroidism 07/03/2016  . 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 Vitamin D insufficiency 07/03/2016  . Vitamin B insufficiency 07/03/2016  . Overweight (BMI 25.0-29.9) 07/03/2016  . History of TAH 07/03/2016  . Hypertension 08/24/2013  . SUI (stress urinary incontinence, female) 08/20/2011    Class: Chronic    Past Medical history, Surgical history, Family history, Social history, Allergies and Medications have been entered into the medical record, reviewed and changed as needed.    Current Meds  Medication Sig  . amLODipine (NORVASC) 10 MG tablet TAKE 1 TABLET BY MOUTH DAILY. (NEEDS APPOINTMENT)  . aspirin 81 MG tablet Take 81 mg by mouth daily.  Marland Kitchen. atorvastatin (LIPITOR) 40 MG tablet TAKE 1 TABLET BY  MOUTH AT BEDTIME.  . B Complex-C-E-Zn (B COMPLEX-C-E-ZINC) tablet Take 1 tablet by mouth daily.  . cholecalciferol (VITAMIN D) 1000 units tablet Take 1,000 Units by mouth daily.  . Glucosamine 500 MG CAPS Take 2 each by mouth daily.   Marland Kitchen. levothyroxine (SYNTHROID, LEVOTHROID) 25 MCG tablet Take 1 tablet (25 mcg total) by mouth daily.  . Magnesium 250 MG TABS Take 1 tablet by mouth daily.  Marland Kitchen. olmesartan (BENICAR) 20 MG tablet Take 0.5 tablets (10 mg total) by mouth daily.    Allergies:  Allergies  Allergen Reactions  . Codeine Shortness Of Breath  . Demerol Nausea And Vomiting     Review of Systems:  A fourteen system review of systems was performed and found to be positive as per HPI.   Objective:   Blood pressure 116/75, pulse 69, height 5' 1.5" (1.562 m), weight 167 lb (75.8 kg), SpO2 96 %. Body mass index is 31.05 kg/m. General:  Well Developed, well nourished, appropriate for stated age.  Neuro:  Alert and oriented,  extra-ocular muscles intact  HEENT:  Normocephalic, atraumatic, neck supple, no carotid bruits appreciated  Skin:  no gross rash, warm, pink. Cardiac:  RRR, S1 S2 Respiratory:  ECTA B/L and A/P,  Not using accessory muscles, speaking in full sentences- unlabored. Vascular:  Ext warm, no cyanosis apprec.; cap RF less 2 sec. Psych:  No HI/SI, judgement and insight good, Euthymic mood. Full Affect.

## 2018-06-14 NOTE — Patient Instructions (Signed)
Exercising to Lose Weight Exercising can help you to lose weight. In order to lose weight through exercise, you need to do vigorous-intensity exercise. You can tell that you are exercising with vigorous intensity if you are breathing very hard and fast and cannot hold a conversation while exercising. Moderate-intensity exercise helps to maintain your current weight. You can tell that you are exercising at a moderate level if you have a higher heart rate and faster breathing, but you are still able to hold a conversation. How often should I exercise? Choose an activity that you enjoy and set realistic goals. Your health care provider can help you to make an activity plan that works for you. Exercise regularly as directed by your health care provider. This may include:  Doing resistance training twice each week, such as: ? Push-ups. ? Sit-ups. ? Lifting weights. ? Using resistance bands.  Doing a given intensity of exercise for a given amount of time. Choose from these options: ? 150 minutes of moderate-intensity exercise every week. ? 75 minutes of vigorous-intensity exercise every week. ? A mix of moderate-intensity and vigorous-intensity exercise every week.  Children, pregnant women, people who are out of shape, people who are overweight, and older adults may need to consult a health care provider for individual recommendations. If you have any sort of medical condition, be sure to consult your health care provider before starting a new exercise program. What are some activities that can help me to lose weight?  Walking at a rate of at least 4.5 miles an hour.  Jogging or running at a rate of 5 miles per hour.  Biking at a rate of at least 10 miles per hour.  Lap swimming.  Roller-skating or in-line skating.  Cross-country skiing.  Vigorous competitive sports, such as football, basketball, and soccer.  Jumping rope.  Aerobic dancing. How can I be more active in my day-to-day  activities?  Use the stairs instead of the elevator.  Take a walk during your lunch break.  If you drive, park your car farther away from work or school.  If you take public transportation, get off one stop early and walk the rest of the way.  Make all of your phone calls while standing up and walking around.  Get up, stretch, and walk around every 30 minutes throughout the day. What guidelines should I follow while exercising?  Do not exercise so much that you hurt yourself, feel dizzy, or get very short of breath.  Consult your health care provider prior to starting a new exercise program.  Wear comfortable clothes and shoes with good support.  Drink plenty of water while you exercise to prevent dehydration or heat stroke. Body water is lost during exercise and must be replaced.  Work out until you breathe faster and your heart beats faster. This information is not intended to replace advice given to you by your health care provider. Make sure you discuss any questions you have with your health care provider. Document Released: 01/16/2011 Document Revised: 05/21/2016 Document Reviewed: 05/17/2014 Elsevier Interactive Patient Education  2018 [employer].       Risk factors for prediabetes and type 2 diabetes  Researchers don't fully understand why some people develop prediabetes and type 2 diabetes and others don't.  It's clear that certain factors increase the risk, however, including:  Weight. The more fatty tissue you have, the more resistant your cells become to insulin.  Inactivity. The less active you are, the greater your risk. Physical  activity helps you control your weight, uses up glucose as energy and makes your cells more sensitive to insulin.  Family history. Your risk increases if a parent or sibling has type 2 diabetes.  Race. Although it's unclear why, people of certain races - including blacks, Hispanics, American Indians and Asian-Americans - are at higher  risk.  Age. Your risk increases as you get older. This may be because you tend to exercise less, lose muscle mass and gain weight as you age. But type 2 diabetes is also increasing dramatically among children, adolescents and younger adults.  Gestational diabetes. If you developed gestational diabetes when you were pregnant, your risk of developing prediabetes and type 2 diabetes later increases. If you gave birth to a baby weighing more than 9 pounds (4 kilograms), you're also at risk of type 2 diabetes.  Polycystic ovary syndrome. For women, having polycystic ovary syndrome - a common condition characterized by irregular menstrual periods, excess hair growth and obesity - increases the risk of diabetes.  High blood pressure. Having blood pressure over 140/90 millimeters of mercury (mm Hg) is linked to an increased risk of type 2 diabetes.  Abnormal cholesterol and triglyceride levels. If you have low levels of high-density lipoprotein (HDL), or "good," cholesterol, your risk of type 2 diabetes is higher. Triglycerides are another type of fat carried in the blood. People with high levels of triglycerides have an increased risk of type 2 diabetes. Your doctor can let you know what your cholesterol and triglyceride levels are.  A good guide to good carbs: The glycemic index ---If you have diabetes, or at risk for diabetes, you know all too well that when you eat carbohydrates, your blood sugar goes up. The total amount of carbs you consume at a meal or in a snack mostly determines what your blood sugar will do. But the food itself also plays a role. A serving of white rice has almost the same effect as eating pure table sugar - a quick, high spike in blood sugar. A serving of lentils has a slower, smaller effect.  ---Picking good sources of carbs can help you control your blood sugar and your weight. Even if you don't have diabetes, eating healthier carbohydrate-rich foods can help ward off a host of  chronic conditions, from heart disease to various cancers to, well, diabetes.  ---One way to choose foods is with the glycemic index (GI). This tool measures how much a food boosts blood sugar.  The glycemic index rates the effect of a specific amount of a food on blood sugar compared with the same amount of pure glucose. A food with a glycemic index of 28 boosts blood sugar only 28% as much as pure glucose. One with a GI of 95 acts like pure glucose.    High glycemic foods result in a quick spike in insulin and blood sugar (also known as blood glucose).  Low glycemic foods have a slower, smaller effect- these are healthier for you.   Using the glycemic index Using the glycemic index is easy: choose foods in the low GI category instead of those in the high GI category (see below), and go easy on those in between. Low glycemic index (GI of 55 or less): Most fruits and vegetables, beans, minimally processed grains, pasta, low-fat dairy foods, and nuts.  Moderate glycemic index (GI 56 to 69): White and sweet potatoes, corn, white rice, couscous, breakfast cereals such as Cream of Wheat and Mini Wheats.  High glycemic index (  GI of 70 or higher): White bread, rice cakes, most crackers, bagels, cakes, doughnuts, croissants, most packaged breakfast cereals. You can see the values for 100 commons foods and get links to more at www.health.[employer]Debt.hu.  Swaps for lowering glycemic index  Instead of this high-glycemic index food Eat this lower-glycemic index food  White rice Brown rice or converted rice  Instant oatmeal Steel-cut oats  Cornflakes Bran flakes  Baked potato Pasta, bulgur  White bread Whole-grain bread  Corn Peas or leafy greens       Prediabetes Eating Plan  Prediabetes--also called impaired glucose tolerance or impaired fasting glucose--is a condition that causes blood sugar (blood glucose) levels to be higher than normal. Following a healthy diet can help to keep  prediabetes under control. It can also help to lower the risk of type 2 diabetes and heart disease, which are increased in people who have prediabetes. Along with regular exercise, a healthy diet:  Promotes weight loss.  Helps to control blood sugar levels.  Helps to improve the way that the body uses insulin.   WHAT DO I NEED TO KNOW ABOUT THIS EATING PLAN?   Use the glycemic index (GI) to plan your meals. The index tells you how quickly a food will raise your blood sugar. Choose low-GI foods. These foods take a longer time to raise blood sugar.  Pay close attention to the amount of carbohydrates in the food that you eat. Carbohydrates increase blood sugar levels.  Keep track of how many calories you take in. Eating the right amount of calories will help you to achieve a healthy weight. Losing about 7 percent of your starting weight can help to prevent type 2 diabetes.  You may want to follow a Mediterranean diet. This diet includes a lot of vegetables, lean meats or fish, whole grains, fruits, and healthy oils and fats.   WHAT FOODS CAN I EAT?  Grains Whole grains, such as whole-wheat or whole-grain breads, crackers, cereals, and pasta. Unsweetened oatmeal. Bulgur. Barley. Quinoa. Brown rice. Corn or whole-wheat flour tortillas or taco shells. Vegetables Lettuce. Spinach. Peas. Beets. Cauliflower. Cabbage. Broccoli. Carrots. Tomatoes. Squash. Eggplant. Herbs. Peppers. Onions. Cucumbers. Brussels sprouts. Fruits Berries. Bananas. Apples. Oranges. Grapes. Papaya. Mango. Pomegranate. Kiwi. Grapefruit. Cherries. Meats and Other Protein Sources Seafood. Lean meats, such as chicken and [place] or lean cuts of pork and beef. Tofu. Eggs. Nuts. Beans. Dairy Low-fat or fat-free dairy products, such as yogurt, cottage cheese, and cheese. Beverages Water. Tea. Coffee. Sugar-free or diet soda. Seltzer water. Milk. Milk alternatives, such as soy or almond milk. Condiments Mustard. Relish.  Low-fat, low-sugar ketchup. Low-fat, low-sugar barbecue sauce. Low-fat or fat-free mayonnaise. Sweets and Desserts Sugar-free or low-fat pudding. Sugar-free or low-fat ice cream and other frozen treats. Fats and Oils Avocado. Walnuts. Olive oil. The items listed above may not be a complete list of recommended foods or beverages. Contact your dietitian for more options.    WHAT FOODS ARE NOT RECOMMENDED?  Grains Refined white flour and flour products, such as bread, pasta, snack foods, and cereals. Beverages Sweetened drinks, such as sweet iced tea and soda. Sweets and Desserts Baked goods, such as cake, cupcakes, pastries, cookies, and cheesecake. The items listed above may not be a complete list of foods and beverages to avoid. Contact your dietitian for more information.   This information is not intended to replace advice given to you by your health care provider. Make sure you discuss any questions you have with your health care provider.  Document Released: 04/30/2015 Document Reviewed: 04/30/2015 Elsevier Interactive Patient Education [employer].

## 2018-07-08 ENCOUNTER — Other Ambulatory Visit: Payer: Self-pay | Admitting: Family Medicine

## 2018-07-08 DIAGNOSIS — I10 Essential (primary) hypertension: Secondary | ICD-10-CM

## 2018-08-17 ENCOUNTER — Other Ambulatory Visit: Payer: Self-pay | Admitting: Family Medicine

## 2018-08-17 DIAGNOSIS — R6889 Other general symptoms and signs: Secondary | ICD-10-CM

## 2018-08-17 DIAGNOSIS — S8264XA Nondisplaced fracture of lateral malleolus of right fibula, initial encounter for closed fracture: Secondary | ICD-10-CM | POA: Diagnosis not present

## 2018-08-17 DIAGNOSIS — E039 Hypothyroidism, unspecified: Secondary | ICD-10-CM

## 2018-08-31 DIAGNOSIS — S8264XA Nondisplaced fracture of lateral malleolus of right fibula, initial encounter for closed fracture: Secondary | ICD-10-CM | POA: Diagnosis not present

## 2018-09-02 ENCOUNTER — Other Ambulatory Visit: Payer: Self-pay | Admitting: Family Medicine

## 2018-09-02 DIAGNOSIS — I10 Essential (primary) hypertension: Secondary | ICD-10-CM

## 2018-09-15 DIAGNOSIS — M25571 Pain in right ankle and joints of right foot: Secondary | ICD-10-CM | POA: Diagnosis not present

## 2018-09-20 ENCOUNTER — Ambulatory Visit (INDEPENDENT_AMBULATORY_CARE_PROVIDER_SITE_OTHER): Payer: PPO | Admitting: Family Medicine

## 2018-09-20 ENCOUNTER — Encounter: Payer: Self-pay | Admitting: Family Medicine

## 2018-09-20 VITALS — BP 132/84 | HR 63 | Ht 61.0 in | Wt 161.2 lb

## 2018-09-20 DIAGNOSIS — R7303 Prediabetes: Secondary | ICD-10-CM

## 2018-09-20 DIAGNOSIS — Z8781 Personal history of (healed) traumatic fracture: Secondary | ICD-10-CM

## 2018-09-20 DIAGNOSIS — E2839 Other primary ovarian failure: Secondary | ICD-10-CM

## 2018-09-20 DIAGNOSIS — E66811 Obesity, class 1: Secondary | ICD-10-CM | POA: Insufficient documentation

## 2018-09-20 DIAGNOSIS — Z8639 Personal history of other endocrine, nutritional and metabolic disease: Secondary | ICD-10-CM | POA: Diagnosis not present

## 2018-09-20 DIAGNOSIS — I10 Essential (primary) hypertension: Secondary | ICD-10-CM

## 2018-09-20 DIAGNOSIS — E782 Mixed hyperlipidemia: Secondary | ICD-10-CM | POA: Diagnosis not present

## 2018-09-20 DIAGNOSIS — F17200 Nicotine dependence, unspecified, uncomplicated: Secondary | ICD-10-CM | POA: Diagnosis not present

## 2018-09-20 DIAGNOSIS — E669 Obesity, unspecified: Secondary | ICD-10-CM

## 2018-09-20 LAB — POCT GLYCOSYLATED HEMOGLOBIN (HGB A1C): Hemoglobin A1C: 5.6 % (ref 4.0–5.6)

## 2018-09-20 NOTE — Patient Instructions (Addendum)
Steps to Quit Smoking Smoking tobacco can be bad for your health. It can also affect almost every organ in your body. Smoking puts you and people around you at risk for many serious long-lasting (chronic) diseases. Quitting smoking is hard, but it is one of the best things that you can do for your health. It is never too late to quit. What are the benefits of quitting smoking? When you quit smoking, you lower your risk for getting serious diseases and conditions. They can include:  Lung cancer or lung disease.  Heart disease.  Stroke.  Heart attack.  Not being able to have children (infertility).  Weak bones (osteoporosis) and broken bones (fractures).  If you have coughing, wheezing, and shortness of breath, those symptoms may get better when you quit. You may also get sick less often. If you are pregnant, quitting smoking can help to lower your chances of having a baby of low birth weight. What can I do to help me quit smoking? Talk with your doctor about what can help you quit smoking. Some things you can do (strategies) include:  Quitting smoking totally, instead of slowly cutting back how much you smoke over a period of time.  Going to in-person counseling. You are more likely to quit if you go to many counseling sessions.  Using resources and support systems, such as: ? Online chats with a counselor. ? Phone quitlines. ? Printed self-help materials. ? Support groups or group counseling. ? Text messaging programs. ? Mobile phone apps or applications.  Taking medicines. Some of these medicines may have nicotine in them. If you are pregnant or breastfeeding, do not take any medicines to quit smoking unless your doctor says it is okay. Talk with your doctor about counseling or other things that can help you.  Talk with your doctor about using more than one strategy at the same time, such as taking medicines while you are also going to in-person counseling. This can help make  quitting easier. What things can I do to make it easier to quit? Quitting smoking might feel very hard at first, but there is a lot that you can do to make it easier. Take these steps:  Talk to your family and friends. Ask them to support and encourage you.  Call phone quitlines, reach out to support groups, or work with a counselor.  Ask people who smoke to not smoke around you.  Avoid places that make you want (trigger) to smoke, such as: ? Bars. ? Parties. ? Smoke-break areas at work.  Spend time with people who do not smoke.  Lower the stress in your life. Stress can make you want to smoke. Try these things to help your stress: ? Getting regular exercise. ? Deep-breathing exercises. ? Yoga. ? Meditating. ? Doing a body scan. To do this, close your eyes, focus on one area of your body at a time from head to toe, and notice which parts of your body are tense. Try to relax the muscles in those areas.  Download or buy apps on your mobile phone or tablet that can help you stick to your quit plan. There are many free apps, such as QuitGuide from the CDC (Centers for Disease Control and Prevention). You can find more support from smokefree.gov and other websites.  This information is not intended to replace advice given to you by your health care provider. Make sure you discuss any questions you have with your health care provider. Document Released: 10/10/2009 Document   Revised: 08/11/2016 Document Reviewed: 04/30/2015 Elsevier Interactive Patient Education  2018 [employer]  

## 2018-09-20 NOTE — Progress Notes (Signed)
Impression and Recommendations:    1. Essential hypertension   2. Prediabetes   3. Obesity, Class I, BMI 30-34.9   4. History of hypothyroidism   5. Tobacco use disorder   6. Mixed hyperlipidemia   7. History of fracture of R ankle- distal fibular fracture due to fall   8. Estrogen deficiency    HTN -Educated pt that bp increases with movement and stress -Informed pt to sit still and rest for with her feet on the floor before taking ambulatory bp -Discussed tendency for some people to experience an increase in bp in the doctor's office -Discussed positive impact of weight loss on HTN  Prediabetes -Discussed current A1C, and factors that may have caused her improvement from last appointment -Discussed role of weight loss in preventing diabetes progression -Encouraged pt to make healthy dietary choices and continue to lose weight to help control A1C levels -Educated pt about negative impacts of diabetes on overall health -Encouraged pt to continue increasing exercise levels in a safe manner  Estrogen Deficiency -Ordered a bone density scan  -Discussed potential decrease in bone strength due to deficiency -Encouraged pt to check for minimum specific calcium levels in her supplement  Vitamin D -Discussed levels of vitamin D recommended  -Encouraged pt to check the nutritional label to ensure her supplement has enough Vitamin D -Discussed the role of vitamin D and calcium in bone health  Hypothyroidism -Pt last tested 5 months ago, WNL and up to date -Informed pt of symptoms of hyperthyroidism and to contact us if she is experiencing them -Discussed possible positive impact of normal thyroid levels on weight loss and energy  Ankle Fracture -Encouraged pt to rest and ice her foot to help heal -Encouraged pt to immobilize her fracture as much as possible  -Discussed possibly aches and pains due to changing gait with her boot -Discussed the importance of carefully  navigating uneven surfaces to prevent injuries  Skin -Encouraged pt not to irritate or pick at bump as it heals -Discussed red flag symptoms for changes in skin -Educated pt about importance of regular skin checks for early detection of changes -Discussed dermatology techniques for mapping changes in pt skin -Encouraged pt to contact medicare for information on dermatologists    Education and routine counseling performed. Handouts provided.  Orders Placed This Encounter  Procedures  . DG Bone Density  . POCT glycosylated hemoglobin (Hb A1C)    Medications Discontinued During This Encounter  Medication Reason  . B Complex-C-E-Zn (B COMPLEX-C-E-ZINC) tablet Patient Preference  . cholecalciferol (VITAMIN D) 1000 units tablet Patient Preference  . Magnesium 250 MG TABS Patient Preference      No orders of the defined types were placed in this encounter.   The patient was counseled, risk factors were discussed, anticipatory guidance given.  Gross side effects, risk and benefits, and alternatives of medications discussed with patient.  Patient is aware that all medications have potential side effects and we are unable to predict every side effect or drug-drug interaction that may occur.  Expresses verbal understanding and consents to current therapy plan and treatment regimen.   No follow-ups on file.   Please see AVS handed out to patient at the end of our visit for further patient instructions/ counseling done pertaining to today's office visit.    Note:  This document was prepared using Dragon voice recognition software and may include unintentional dictation errors.  This document serves as a record of services personally performed  by Thomasene Lot, MD. It was created on her behalf by Alphonse Guild, a trained medical scribe. The creation of this record is based on the scribe's personal observations and the provider's statements to them.   I have reviewed the above medical  documentation for accuracy and completeness and I concur.  Thomasene Lot 09/20/18 1:14 PM     Subjective:    Chief Complaint  Patient presents with  . Follow-up     Andrea Floyd is a 67 y.o. female who presents to [facility] today for Diabetes Management.     Prediabetes -A1C has decreased since previous appointment -States she has been more conscientious about eating healthy   Last A1C in the office was:  Lab Results  Component Value Date   HGBA1C 5.6 09/20/2018   HGBA1C 5.8 (H) 02/08/2018   HGBA1C 5.4 09/29/2016    Lab Results  Component Value Date   LDLCALC 90 02/08/2018   CREATININE 0.80 02/08/2018   Ankle Fracture -Pt injured her RLE about 5 weeks ago -She is being seen by Dr. Jerl Santos at [facility] -States it was a non-displaced fracture and it is healing well  Skin spot -States she had a little bump "pop up over night" -States she has been dealing with some swelling and irritation -Pt was worried and wanted to know if she needs further evaluation  Weight Loss -Pt has lost 6lbs since previous appointment -Pt states "I don't know what I did to do that" -Has been spending time with her 49 month old granddaughter and believes that has helped her get more exercise  Blood Pressure 132/84 today in office -States her BP has been running at low 130s/80s or less -Checks her bp at least once each week -Says her bp before she left was 127/78 but it always increases when she comes to the office  Last 3 blood pressure readings in our office are as follows: BP Readings from Last 3 Encounters:  09/20/18 132/84  06/14/18 116/75  02/14/18 117/76   Hypothyroidism -Denies symptoms of heart palpitations, sweating, irritation -States she has been feeling better since beginning the synthroid a few months ago  Vitamin D -States she has been taking "a multivitamin for women over 50" -Stopped taking vitamin D supplements  but says her multivitamin has vitamin D   BMI Readings from Last 3 Encounters:  09/20/18 30.46 kg/m  06/14/18 31.05 kg/m  02/14/18 30.12 kg/m     Problem  Mixed Hyperlipidemia   10 year atherosclerotic cardiovascular disease risk calculated at 22% from labs drawn in Oct, '17   Obesity, Class I, Bmi 30-34.9  History of fracture of R ankle- distal fibular fracture due to fall  Estrogen Deficiency      Patient Care Team    Relationship Specialty Notifications Start End  Thomasene Lot, DO PCP - General Family Medicine  07/03/16      Patient Active Problem List   Diagnosis Date Noted  . Mixed hyperlipidemia 07/05/2017    Priority: High  . Obesity, Class I, BMI 30-34.9 09/20/2018    Priority: Medium  . History of fracture of R ankle- distal fibular fracture due to fall 09/20/2018  . Estrogen deficiency 09/20/2018  . BMI 30.0-30.9,adult 06/14/2018  . Essential hypertension 06/14/2018  . Family history of diabetes mellitus in father- in 26's 02/14/2018  . Prediabetes 02/14/2018  . Hypothyroidism 02/14/2018  . Hypertriglyceridemia 07/05/2017  . Medication monitoring encounter 07/05/2017  . Inactivity 07/05/2017  . Tobacco  use disorder 07/03/2016  . History of hypothyroidism 07/03/2016  . 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 Vitamin D insufficiency 07/03/2016  . Vitamin B insufficiency 07/03/2016  . Overweight (BMI 25.0-29.9) 07/03/2016  . History of TAH 07/03/2016  . Hypertension 08/24/2013  . SUI (stress urinary incontinence, female) 08/20/2011    Class: Chronic     Past Medical History:  Diagnosis Date  . 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 Hypertension   . SUI (stress urinary incontinence, female) 08/20/2011  . Tobacco use disorder 07/03/2016  . Vitamin D insufficiency 07/03/2016     Past Surgical History:  Procedure Laterality Date  . ABDOMINAL HYSTERECTOMY    . ANTERIOR AND POSTERIOR REPAIR   08/20/2011   Procedure: ANTERIOR (CYSTOCELE) AND POSTERIOR REPAIR (RECTOCELE);  Surgeon: Robley FriesVaishali R Mody;  Location: [facility];  Service: Gynecology;  Laterality: Bilateral;  With Cystoscopy  . BACK SURGERY    . BLADDER SUSPENSION  08/20/2011   Procedure: TRANSVAGINAL TAPE (TVT) PROCEDURE;  Surgeon: Robley FriesVaishali R Mody;  Location: [facility];  Service: Gynecology;  Laterality: Bilateral;  . CHOLECYSTECTOMY    . TONSILLECTOMY    . VAGINAL HYSTERECTOMY  08/20/2011   Procedure: HYSTERECTOMY VAGINAL;  Surgeon: Robley FriesVaishali R Mody;  Location: [facility];  Service: Gynecology;  Laterality: N/A;  With Removal Of Pessary.     Family History  Problem Relation Age of Onset  . Hypertension Mother   . Alzheimer's disease Mother   . Diabetes Father   . Hypertension Father   . Cancer Sister        colon  . Hypertension Brother   . Diabetes Son   . Hypertension Brother   . Hypertension Brother      Social History   Substance and Sexual Activity  Drug Use No  ,  Social History   Substance and Sexual Activity  Alcohol Use Yes  . Alcohol/week: 1.0 - 2.0 standard drinks  . Types: 1 - 2 Glasses of wine per week   Comment: daily  ,  Social History   Tobacco Use  Smoking Status Current Every Day Smoker  . Packs/day: 0.25  . Types: Cigarettes  Smokeless Tobacco Never Used  ,    Current Outpatient Medications on File Prior to Visit  Medication Sig Dispense Refill  . amLODipine (NORVASC) 10 MG tablet TAKE 1 TABLET BY MOUTH DAILY. (NEEDS APPOINTMENT) 90 tablet 1  . aspirin 81 MG tablet Take 81 mg by mouth daily.    Marland Kitchen. atorvastatin (LIPITOR) 40 MG tablet TAKE 1 TABLET BY MOUTH AT BEDTIME. 90 tablet 1  . Glucosamine 500 MG CAPS Take 2 each by mouth daily.     Marland Kitchen. levothyroxine (SYNTHROID, LEVOTHROID) 25 MCG tablet TAKE 1 TABLET BY MOUTH DAILY. 90 tablet 1  . Multiple Vitamin (MULTIVITAMIN) capsule Take 1 capsule by mouth daily.    Marland Kitchen. olmesartan (BENICAR) 20 MG tablet TAKE 1/2 TABLET BY MOUTH DAILY. 90 tablet 1     No current facility-administered medications on file prior to visit.      Allergies  Allergen Reactions  . Codeine Shortness Of Breath  . Demerol Nausea And Vomiting     Review of Systems:   General:  Denies fever, chills Optho/Auditory:   Denies visual changes, blurred vision Respiratory:   Denies SOB, cough, wheeze, DIB  Cardiovascular:   Denies chest pain, palpitations, painful respirations Gastrointestinal:   Denies nausea, vomiting, diarrhea.  Endocrine:  Denies new hot or cold intolerance Musculoskeletal:  Denies joint swelling, gait issues, or new unexplained myalgias/ arthralgias Skin:  Denies rash, suspicious lesions  Neurological:    Denies dizziness, unexplained weakness, numbness  Psychiatric/Behavioral:   Denies mood changes    Objective:     Blood pressure 132/84, pulse 63, height 5\' 1"  (1.549 m), weight 161 lb 3.2 oz (73.1 kg), SpO2 98 %.  Body mass index is 30.46 kg/m.  General: Well Developed, well nourished, and in no acute distress.  HEENT: Normocephalic, atraumatic, pupils equal round reactive to light, neck supple, No carotid bruits, no JVD Skin: Warm and dry, cap RF less 2 sec Right check with less that 1.5 mm circumferential elevated skin papule with area of central scab  Cardiac: Regular rate and rhythm, S1, S2 WNL's, no murmurs rubs or gallops Respiratory: ECTA B/L, Not using accessory muscles, speaking in full sentences. NeuroM-Sk: Ambulates w/o assistance, moves ext * 4 w/o difficulty, sensation grossly intact.  Ext: scant edema b/l lower ext Psych: No HI/SI, judgement and insight good, Euthymic mood. Full Affect.  Right check with less that 1.5 mm circumferential elevated skin papule with area of central scab

## 2018-10-21 ENCOUNTER — Telehealth: Payer: Self-pay

## 2018-10-21 DIAGNOSIS — M25571 Pain in right ankle and joints of right foot: Secondary | ICD-10-CM | POA: Diagnosis not present

## 2018-10-24 ENCOUNTER — Other Ambulatory Visit: Payer: Self-pay | Admitting: Family Medicine

## 2018-10-24 DIAGNOSIS — E782 Mixed hyperlipidemia: Secondary | ICD-10-CM

## 2018-10-24 DIAGNOSIS — E781 Pure hyperglyceridemia: Secondary | ICD-10-CM

## 2018-10-31 DIAGNOSIS — S8264XD Nondisplaced fracture of lateral malleolus of right fibula, subsequent encounter for closed fracture with routine healing: Secondary | ICD-10-CM | POA: Diagnosis not present

## 2018-10-31 DIAGNOSIS — M6281 Muscle weakness (generalized): Secondary | ICD-10-CM | POA: Diagnosis not present

## 2018-11-01 DIAGNOSIS — M6281 Muscle weakness (generalized): Secondary | ICD-10-CM | POA: Diagnosis not present

## 2018-11-01 DIAGNOSIS — S8264XD Nondisplaced fracture of lateral malleolus of right fibula, subsequent encounter for closed fracture with routine healing: Secondary | ICD-10-CM | POA: Diagnosis not present

## 2018-11-04 ENCOUNTER — Other Ambulatory Visit: Payer: Self-pay | Admitting: *Deleted

## 2018-11-04 NOTE — Patient Outreach (Signed)
[facility]) Care Management  11/04/2018  LIS SAVITT 1951/10/13 [id number]   RN Health Coach attempted#1  follow up screening call to patient.  Patient was unavailable. HIPPA compliance voicemail message left with return callback number.  Plan: RN will call patient again within 10 business days.  Gean Maidens BSN RN [facility] [phone]

## 2018-11-08 DIAGNOSIS — S8264XD Nondisplaced fracture of lateral malleolus of right fibula, subsequent encounter for closed fracture with routine healing: Secondary | ICD-10-CM | POA: Diagnosis not present

## 2018-11-08 DIAGNOSIS — M6281 Muscle weakness (generalized): Secondary | ICD-10-CM | POA: Diagnosis not present

## 2018-11-10 DIAGNOSIS — M6281 Muscle weakness (generalized): Secondary | ICD-10-CM | POA: Diagnosis not present

## 2018-11-10 DIAGNOSIS — S8264XD Nondisplaced fracture of lateral malleolus of right fibula, subsequent encounter for closed fracture with routine healing: Secondary | ICD-10-CM | POA: Diagnosis not present

## 2018-11-15 DIAGNOSIS — S8264XD Nondisplaced fracture of lateral malleolus of right fibula, subsequent encounter for closed fracture with routine healing: Secondary | ICD-10-CM | POA: Diagnosis not present

## 2018-11-15 DIAGNOSIS — M6281 Muscle weakness (generalized): Secondary | ICD-10-CM | POA: Diagnosis not present

## 2018-11-17 DIAGNOSIS — M6281 Muscle weakness (generalized): Secondary | ICD-10-CM | POA: Diagnosis not present

## 2018-11-17 DIAGNOSIS — S8264XD Nondisplaced fracture of lateral malleolus of right fibula, subsequent encounter for closed fracture with routine healing: Secondary | ICD-10-CM | POA: Diagnosis not present

## 2018-11-21 DIAGNOSIS — S8264XA Nondisplaced fracture of lateral malleolus of right fibula, initial encounter for closed fracture: Secondary | ICD-10-CM | POA: Diagnosis not present

## 2018-11-21 DIAGNOSIS — M6281 Muscle weakness (generalized): Secondary | ICD-10-CM | POA: Diagnosis not present

## 2018-11-21 DIAGNOSIS — S8264XD Nondisplaced fracture of lateral malleolus of right fibula, subsequent encounter for closed fracture with routine healing: Secondary | ICD-10-CM | POA: Diagnosis not present

## 2018-11-23 ENCOUNTER — Other Ambulatory Visit: Payer: PPO

## 2018-11-29 ENCOUNTER — Ambulatory Visit
Admission: RE | Admit: 2018-11-29 | Discharge: 2018-11-29 | Disposition: A | Discharge status: Home / Self Care | Payer: PPO | Source: Ambulatory Visit | Attending: Family Medicine | Admitting: Family Medicine

## 2018-11-29 DIAGNOSIS — M85852 Other specified disorders of bone density and structure, left thigh: Secondary | ICD-10-CM | POA: Diagnosis not present

## 2018-11-29 DIAGNOSIS — E2839 Other primary ovarian failure: Secondary | ICD-10-CM

## 2018-11-29 DIAGNOSIS — M6281 Muscle weakness (generalized): Secondary | ICD-10-CM | POA: Diagnosis not present

## 2018-11-29 DIAGNOSIS — S8264XD Nondisplaced fracture of lateral malleolus of right fibula, subsequent encounter for closed fracture with routine healing: Secondary | ICD-10-CM | POA: Diagnosis not present

## 2018-11-29 DIAGNOSIS — Z780 Asymptomatic menopausal state: Secondary | ICD-10-CM | POA: Diagnosis not present

## 2018-12-01 ENCOUNTER — Telehealth: Payer: Self-pay | Admitting: Family Medicine

## 2018-12-01 NOTE — Telephone Encounter (Signed)
[place], both CMAs unavailable at the time of call. Please contact patient back about these results

## 2018-12-01 NOTE — Telephone Encounter (Signed)
See result note.  T. Xan Ingraham, CMA 

## 2019-01-09 ENCOUNTER — Other Ambulatory Visit: Payer: Self-pay | Admitting: Family Medicine

## 2019-01-09 DIAGNOSIS — I10 Essential (primary) hypertension: Secondary | ICD-10-CM

## 2019-02-08 ENCOUNTER — Other Ambulatory Visit: Payer: Self-pay | Admitting: Family Medicine

## 2019-02-08 DIAGNOSIS — E039 Hypothyroidism, unspecified: Secondary | ICD-10-CM

## 2019-02-08 DIAGNOSIS — R6889 Other general symptoms and signs: Secondary | ICD-10-CM

## 2019-03-14 DIAGNOSIS — Z1231 Encounter for screening mammogram for malignant neoplasm of breast: Secondary | ICD-10-CM | POA: Diagnosis not present

## 2019-03-14 LAB — HM MAMMOGRAPHY

## 2019-05-03 ENCOUNTER — Other Ambulatory Visit: Payer: Self-pay | Admitting: Family Medicine

## 2019-05-03 DIAGNOSIS — E782 Mixed hyperlipidemia: Secondary | ICD-10-CM

## 2019-05-03 DIAGNOSIS — E781 Pure hyperglyceridemia: Secondary | ICD-10-CM

## 2019-05-30 ENCOUNTER — Telehealth: Payer: Self-pay | Admitting: Family Medicine

## 2019-05-30 NOTE — Telephone Encounter (Signed)
-----   Message from Melissa D Pulliam, CMA sent at 05/03/2019 11:54 AM EDT ----- °Patient is due for follow up, please call the patient to make an appointment.  Thanks. MPulliam, CMA/RT(R) ° °

## 2019-05-30 NOTE — Telephone Encounter (Signed)
Left patient message to call office to set up provider required OV .   --Forwarding FYI to medical assistant .  --Fausto Skillern

## 2019-05-30 NOTE — Telephone Encounter (Signed)
-----   Message from [place] Crane, CMA sent at 05/03/2019 11:54 AM EDT ----- Patient is due for follow up, please call the patient to make an appointment.  Thanks. MPulliam, CMA/RT(R)

## 2019-06-08 ENCOUNTER — Telehealth: Payer: PPO | Admitting: Physician Assistant

## 2019-06-08 DIAGNOSIS — R399 Unspecified symptoms and signs involving the genitourinary system: Secondary | ICD-10-CM

## 2019-06-08 DIAGNOSIS — N390 Urinary tract infection, site not specified: Secondary | ICD-10-CM

## 2019-06-08 MED ORDER — CEPHALEXIN 500 MG PO CAPS: 500.0000 mg | ORAL_CAPSULE | Freq: Two times a day (BID) | ORAL | 0 refills | Status: AC

## 2019-06-08 NOTE — Progress Notes (Signed)

## 2019-06-08 NOTE — Progress Notes (Signed)
I have spent 5 minutes in review of e-visit questionnaire, review and updating patient chart, medical decision making and response to patient.   Rehana Uncapher Cody Terryl Molinelli, PA-C    

## 2019-07-11 DIAGNOSIS — N611 Abscess of the breast and nipple: Secondary | ICD-10-CM | POA: Diagnosis not present

## 2019-07-11 DIAGNOSIS — N952 Postmenopausal atrophic vaginitis: Secondary | ICD-10-CM | POA: Diagnosis not present

## 2019-07-11 DIAGNOSIS — R21 Rash and other nonspecific skin eruption: Secondary | ICD-10-CM | POA: Diagnosis not present

## 2019-07-11 DIAGNOSIS — N764 Abscess of vulva: Secondary | ICD-10-CM | POA: Diagnosis not present

## 2019-07-13 ENCOUNTER — Ambulatory Visit: Payer: PPO | Admitting: Family Medicine

## 2019-07-18 ENCOUNTER — Other Ambulatory Visit: Payer: Self-pay | Admitting: Family Medicine

## 2019-07-18 DIAGNOSIS — I10 Essential (primary) hypertension: Secondary | ICD-10-CM

## 2019-07-19 DIAGNOSIS — N764 Abscess of vulva: Secondary | ICD-10-CM | POA: Diagnosis not present

## 2019-07-19 DIAGNOSIS — N952 Postmenopausal atrophic vaginitis: Secondary | ICD-10-CM | POA: Diagnosis not present

## 2019-08-10 ENCOUNTER — Other Ambulatory Visit: Payer: Self-pay | Admitting: Family Medicine

## 2019-08-10 DIAGNOSIS — I10 Essential (primary) hypertension: Secondary | ICD-10-CM

## 2019-08-10 DIAGNOSIS — R6889 Other general symptoms and signs: Secondary | ICD-10-CM

## 2019-08-10 DIAGNOSIS — E039 Hypothyroidism, unspecified: Secondary | ICD-10-CM

## 2019-09-11 ENCOUNTER — Ambulatory Visit (INDEPENDENT_AMBULATORY_CARE_PROVIDER_SITE_OTHER): Payer: PPO | Admitting: Family Medicine

## 2019-09-11 ENCOUNTER — Other Ambulatory Visit: Payer: Self-pay

## 2019-09-11 ENCOUNTER — Encounter: Payer: Self-pay | Admitting: Family Medicine

## 2019-09-11 VITALS — BP 124/69 | HR 86 | Temp 97.3°F | Ht 61.5 in | Wt 162.0 lb

## 2019-09-11 DIAGNOSIS — E782 Mixed hyperlipidemia: Secondary | ICD-10-CM

## 2019-09-11 DIAGNOSIS — R7303 Prediabetes: Secondary | ICD-10-CM | POA: Diagnosis not present

## 2019-09-11 DIAGNOSIS — M8580 Other specified disorders of bone density and structure, unspecified site: Secondary | ICD-10-CM | POA: Diagnosis not present

## 2019-09-11 DIAGNOSIS — F17200 Nicotine dependence, unspecified, uncomplicated: Secondary | ICD-10-CM

## 2019-09-11 DIAGNOSIS — Z9119 Patient's noncompliance with other medical treatment and regimen: Secondary | ICD-10-CM | POA: Diagnosis not present

## 2019-09-11 DIAGNOSIS — Z716 Tobacco abuse counseling: Secondary | ICD-10-CM

## 2019-09-11 DIAGNOSIS — E559 Vitamin D deficiency, unspecified: Secondary | ICD-10-CM

## 2019-09-11 DIAGNOSIS — E039 Hypothyroidism, unspecified: Secondary | ICD-10-CM | POA: Diagnosis not present

## 2019-09-11 DIAGNOSIS — I10 Essential (primary) hypertension: Secondary | ICD-10-CM

## 2019-09-11 DIAGNOSIS — Z91199 Patient's noncompliance with other medical treatment and regimen due to unspecified reason: Secondary | ICD-10-CM

## 2019-09-11 DIAGNOSIS — E781 Pure hyperglyceridemia: Secondary | ICD-10-CM

## 2019-09-11 MED ORDER — VITAMIN D3 125 MCG (5000 UT) PO TABS: ORAL_TABLET | ORAL | 3 refills | Status: AC

## 2019-09-11 MED ORDER — CALCIUM CARBONATE-VITAMIN D 600-400 MG-UNIT PO TABS: 1.0000 | ORAL_TABLET | Freq: Two times a day (BID) | ORAL | 11 refills | Status: AC

## 2019-09-11 NOTE — Progress Notes (Signed)
Telehealth office visit note for Andrea Floyd, D.O- at Primary Care at [facility]   I connected with current patient today and verified that I am speaking with the correct person using two identifiers.   . Location of the patient: Home . Location of the provider: Office Only the patient (+/- their family members at pt's discretion) and myself were participating in the encounter    - This visit type was conducted due to national recommendations for restrictions regarding the COVID-19 Pandemic (e.g. social distancing) in an effort to limit this patient's exposure and mitigate transmission in our community.  This format is felt to be most appropriate for this patient at this time.   - The patient did not have access to video technology or had technical difficulties with video requiring transitioning to audio format only. - No physical exam could be performed with this format, beyond that communicated to [place] by the patient/ family members as noted.   - Additionally my office staff/ schedulers discussed with the patient that there may be a monetary charge related to this service, depending on their medical insurance.   The patient expressed understanding, and agreed to proceed.       History of Present Illness:   Notes she takes a multivitamin daily along with 4000 IU's of Vitamin D.  - Smoking Habits States still smoking, "minimally," about two cigarettes per day.  She has thought about completely quitting before.  1. HTN HPI:  -  Her blood pressure has been controlled at home.  Pt is checking it at home.   Thinks that her BP is only high occasionally due to her sodium intake or "if I'm on my feet all day."  Other than that, it's "really been running pretty well, around her current measurement (124/69) and 128/85."  - Patient reports good compliance with blood pressure medications  - Denies medication S-E   - Smoking Status noted   - She denies new onset of: chest pain,  exercise intolerance, shortness of breath, dizziness, visual changes, headache, lower extremity swelling or claudication.   Last 3 blood pressure readings in our office are as follows: BP Readings from Last 3 Encounters:  09/11/19 124/69  09/20/18 132/84  06/14/18 116/75    Filed Weights   09/11/19 0947  Weight: 162 lb (73.5 kg)    2. 68 y.o. female here for cholesterol follow-up.   - Patient reports good compliance with medications or treatment plan  - Denies medication S-E   - Smoking Status noted   - She denies new onset of: chest pain, exercise intolerance, shortness of breath, dizziness, visual changes, headache, lower extremity swelling or claudication.   Denies myalgias  The cholesterol last visit was:  Lab Results  Component Value Date   CHOL 182 02/08/2018   HDL 63 02/08/2018   LDLCALC 90 02/08/2018   TRIG 147 02/08/2018   CHOLHDL 2.9 02/08/2018    Hepatic Function Latest Ref Rng & Units 02/08/2018 08/11/2017 09/29/2016  Total Protein 6.0 - 8.5 g/dL 7.1 - 7.4  Albumin 3.6 - 4.8 g/dL 4.7 - 4.6  AST 0 - 40 IU/L 23 - 25  ALT 0 - 32 IU/L 28 34(H) 25  Alk Phosphatase 39 - 117 IU/L 67 - 64  Total Bilirubin 0.0 - 1.2 mg/dL 0.9 - 0.9    Last A1C in the office was:  Lab Results  Component Value Date   HGBA1C 5.6 09/20/2018   HGBA1C 5.8 (H) 02/08/2018  HGBA1C 5.4 09/29/2016    Lab Results  Component Value Date   LDLCALC 90 02/08/2018   CREATININE 0.80 02/08/2018    Wt Readings from Last 3 Encounters:  09/11/19 162 lb (73.5 kg)  09/20/18 161 lb 3.2 oz (73.1 kg)  06/14/18 167 lb (75.8 kg)    BP Readings from Last 3 Encounters:  09/11/19 124/69  09/20/18 132/84  06/14/18 116/75     Depression screen PHQ 2/9 09/20/2018 06/14/2018 02/14/2018 10/05/2017 07/05/2017  Decreased Interest 0 0 0 0 0  Down, Depressed, Hopeless 0 0 0 0 0  PHQ - 2 Score 0 0 0 0 0  Altered sleeping 0 1 1 0 -  Tired, decreased energy 0 0 1 0 -  Change in appetite 0 0 0 0 -   Feeling bad or failure about yourself  0 0 0 0 -  Trouble concentrating 0 0 0 0 -  Moving slowly or fidgety/restless 0 0 0 0 -  Suicidal thoughts 0 0 0 0 -  PHQ-9 Score 0 1 2 0 -  Difficult doing work/chores Not difficult at all Not difficult at all Not difficult at all Not difficult at all -        Impression and Recommendations:    1. Essential hypertension   2. Mixed hyperlipidemia   3. Hypertriglyceridemia   4. Hypothyroidism, unspecified type   5. Prediabetes   6. Tobacco use disorder   7. Tobacco abuse counseling   8. Vitamin D insufficiency   9. Osteopenia, unspecified location   10. Noncompliance      Essential Hypertension - BP stable at this time on current treatment. - Continue treatment plan as prescribed. See med list below. - Patient tolerating meds well without complication.  Denies S-E  - Prudent lifestyle changes such as dash diet and engaging in a regular exercise program discussed with patient.   - Ambulatory BP monitoring encouraged. Keep log and bring in next OV.  - Will continue to monitor.  Mixed Hyperlipidemia, Hypertriglyceridemia - Continues on statin management. - Continue treatment plan as prescribed. See med list below. - Patient tolerating meds well without complication.  Denies S-E  - Prudent dietary changes such as low saturated & trans fat and low carb diets discussed with patient.  Encouraged regular exercise and weight loss when appropriate.   - Will continue to monitor and re-check as recommended.  Hypothyroidism - Stable last check in April of 2019. - Patient asymptomatic at this time. - Continue treatment plan as prescribed. See med list below. - Patient tolerating meds well without complication.  Denies S-E  - Will continue to monitor and re-check as recommended.  Prediabetes - A1c last check (11 months ago) at 5.6, down from 5.8 prior. - Recommended re-check yearly.  - Counseled patient on prevention of diabetes and  discussed dietary and lifestyle modifications as first line.  Importance of low carb diet discussed with patient in addition to regular exercise.   - Will continue to monitor and re-check as recommended.  Tobacco Use Disorder - Per patient, continues smoking "minimally," two cigarettes daily. - Discussed that we are here for the patient at any time. - Patient knows to call in if she desires assistance with smoking cessation. - Strongly encouraged patient to quit smoking when she feels ready. - Will continue to monitor.  Vitamin D Insufficiency - Per pt, has been taking 4000 IU's Vitamin D daily. - Reviewed recommendation of Vitamin D supplementation of 5000 IU's daily. - Will  continue to monitor and re-check as recommended.  Osteopenia, Unspecified - Reviewed results of last DEXA (December 2019) with patient today. - Discussed importance of weight-bearing exercise 30-60 minutes 5 days per week or more. - Reviewed need for 1200 mg of elemental Calcium daily. - Encouraged patient to do a Producer, television/film/video for calcium intake in diet.  BMI Counseling - Body mass index is 30.11 kg/m. Explained to patient what BMI refers to, and what it means medically.    Told patient to think about it as a "medical risk stratification measurement" and how increasing BMI is associated with increasing risk/ or worsening state of various diseases such as hypertension, hyperlipidemia, diabetes, premature OA, depression etc.  American Heart Association guidelines for healthy diet, basically Mediterranean diet, and exercise guidelines of 30 minutes 5 days per week or more discussed in detail.  Health counseling performed.  All questions answered.  Lifestyle & Preventative Health Maintenance - Advised patient to continue working toward exercising to improve overall mental, physical, and emotional health.    - Reviewed the "spokes of the wheel" of mood and health management.  Stressed the importance of ongoing  prudent habits, including regular exercise, appropriate sleep hygiene, healthful dietary habits, and prayer/meditation to relax.  - Encouraged patient to engage in daily physical activity, especially a formal exercise routine.  Recommended that the patient eventually strive for at least 150 minutes of moderate cardiovascular activity per week according to guidelines established by the [facility].   - Healthy dietary habits encouraged, including low-carb, and high amounts of lean protein in diet.   - Patient should also consume adequate amounts of water.  Shingles Vaccine - Discussion held with patient to answer questions.  Education provided. - Advised patient to call where she had the shingles vaccine and have results faxed to clinic here.  Influenza Vaccine - Education provided and all questions answered.  - Advised patient to come in for flu vaccine in near future PRN.  Recommendations - Advised patient to return for chronic follow-up every 4 months. - Patient will return in December 2020 for fasting blood work and [employer].   - As part of my medical decision making, I reviewed the following data within the [place] History obtained from pt /family, CMA notes reviewed and incorporated if applicable, Labs reviewed, Radiograph/ tests reviewed if applicable and OV notes from prior OV's with me, as well as other specialists she/he has seen since seeing me last, were all reviewed and used in my medical decision making process today.   - Additionally, discussion had with patient regarding txmnt plan, and their biases/concerns about that plan were used in my medical decision making today.   - The patient agreed with the plan and demonstrated an understanding of the instructions.   No barriers to understanding were identified.   - Red flag symptoms and signs discussed in detail.  Patient expressed understanding regarding what to do in case of emergency\ urgent symptoms.  The  patient was advised to call back or seek an in-person evaluation if the symptoms worsen or if the condition fails to improve as anticipated.   Return for dec 2020- come in for FBW in am, and do Medicare wellness via telehealth in afternoon.    Meds ordered this encounter  Medications  . Calcium Carbonate-Vitamin D 600-400 MG-UNIT tablet    Sig: Take 1 tablet by mouth 2 (two) times daily.    Dispense:  60 tablet    Refill:  11  .  Cholecalciferol (VITAMIN D3) 125 MCG (5000 UT) TABS    Sig: Pt takes 4,000 IU OTC vitamin D3 daily.    Dispense:  90 tablet    Refill:  3    There are no discontinued medications.    I provided 16 minutes of non face-to-face time during this encounter.  Additional time was spent with charting and coordination of care after the actual visit commenced.   Note:  This note was prepared with assistance of Dragon voice recognition software. Occasional wrong-word or sound-a-like substitutions may have occurred due to the inherent limitations of voice recognition software.  Andrea Dance, DO  This document serves as a record of services personally performed by Andrea Dance, DO. It was created on her behalf by Toni Amend, a trained medical scribe. The creation of this record is based on the scribe's personal observations and the provider's statements to them.   I have reviewed the above medical documentation for accuracy and completeness and I concur.  Andrea Dance, DO 09/11/2019 1:06 PM    Patient Care Team    Relationship Specialty Notifications Start End  Andrea Dance, DO PCP - General Family Medicine  07/03/16   Pleasant, Eppie Gibson, RN [facility]   10/21/18      -Vitals obtained; medications/ allergies reconciled;  personal medical, social, Sx etc.histories were updated by CMA, reviewed by me and are reflected in chart   Patient Active Problem List   Diagnosis Date Noted  . Hypothyroidism 02/14/2018     Priority: High  . Mixed hyperlipidemia 07/05/2017    Priority: High  . Osteopenia 09/11/2019    Priority: Medium  . Obesity, Class I, BMI 30-34.9 09/20/2018    Priority: Medium  . Prediabetes 02/14/2018    Priority: Medium  . Tobacco use disorder 07/03/2016    Priority: Medium  . 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 Noncompliance 09/11/2019  . History of fracture of R ankle- distal fibular fracture due to fall 09/20/2018  . Estrogen deficiency 09/20/2018  . BMI 30.0-30.9,adult 06/14/2018  . Essential hypertension 06/14/2018  . Family history of diabetes mellitus in father- in 66's 02/14/2018  . Hypertriglyceridemia 07/05/2017  . Medication monitoring encounter 07/05/2017  . Inactivity 07/05/2017  . History of hypothyroidism 07/03/2016  . Vitamin D insufficiency 07/03/2016  . Vitamin B insufficiency 07/03/2016  . Overweight (BMI 25.0-29.9) 07/03/2016  . History of TAH 07/03/2016  . Hypertension 08/24/2013  . SUI (stress urinary incontinence, female) 08/20/2011    Class: Chronic     Current Meds  Medication Sig  . amLODipine (NORVASC) 10 MG tablet TAKE 1 TABLET BY MOUTH DAILY. NEEDS APPOINTMENT  . aspirin 81 MG tablet Take 81 mg by mouth daily.  Marland Kitchen atorvastatin (LIPITOR) 40 MG tablet TAKE 1 TABLET BY MOUTH AT BEDTIME.  Marland Kitchen Glucosamine 500 MG CAPS Take 2 each by mouth daily.   Marland Kitchen levothyroxine (SYNTHROID) 25 MCG tablet Take 1 tablet (25 mcg total) by mouth daily. Needs office visit for further refills  . Multiple Vitamin (MULTIVITAMIN) capsule Take 1 capsule by mouth daily.  Marland Kitchen olmesartan (BENICAR) 20 MG tablet TAKE 1/2 TABLET BY MOUTH DAILY.     Allergies:  Allergies  Allergen Reactions  . Codeine Shortness Of Breath  . Demerol Nausea And Vomiting     ROS:  See above HPI for pertinent positives and negatives   Objective:   Blood pressure 124/69, pulse 86, temperature (!) 97.3 F (  36.3 C), height 5' 1.5" (1.562 m),  weight 162 lb (73.5 kg).  (if some vitals are omitted, this means that patient was UNABLE to obtain them even though they were asked to get them prior to OV today.  They were asked to call us at their earliest convenience with these once obtained. )  General: A & O * 3; sounds in no acute distress; in usual state of health.  Skin: Pt confirms warm and dry extremities and pink fingertips HEENT: Pt confirms lips non-cyanotic Chest: Patient confirms normal chest excursion and movement Respiratory: speaking in full sentences, no conversational dyspnea; patient confirms no use of accessory muscles Psych: insight appears good, mood- appears full

## 2019-09-12 ENCOUNTER — Other Ambulatory Visit: Payer: Self-pay | Admitting: Family Medicine

## 2019-09-12 DIAGNOSIS — R6889 Other general symptoms and signs: Secondary | ICD-10-CM

## 2019-09-12 DIAGNOSIS — E039 Hypothyroidism, unspecified: Secondary | ICD-10-CM

## 2019-09-12 DIAGNOSIS — I10 Essential (primary) hypertension: Secondary | ICD-10-CM

## 2019-10-12 ENCOUNTER — Other Ambulatory Visit: Payer: Self-pay

## 2019-10-12 ENCOUNTER — Ambulatory Visit (INDEPENDENT_AMBULATORY_CARE_PROVIDER_SITE_OTHER): Payer: PPO

## 2019-10-12 DIAGNOSIS — Z23 Encounter for immunization: Secondary | ICD-10-CM

## 2019-10-25 ENCOUNTER — Other Ambulatory Visit: Payer: Self-pay | Admitting: Family Medicine

## 2019-10-25 DIAGNOSIS — E781 Pure hyperglyceridemia: Secondary | ICD-10-CM

## 2019-10-25 DIAGNOSIS — E782 Mixed hyperlipidemia: Secondary | ICD-10-CM

## 2019-12-01 ENCOUNTER — Ambulatory Visit (INDEPENDENT_AMBULATORY_CARE_PROVIDER_SITE_OTHER): Payer: PPO | Admitting: Family Medicine

## 2019-12-01 ENCOUNTER — Other Ambulatory Visit: Payer: Self-pay

## 2019-12-01 ENCOUNTER — Encounter: Payer: Self-pay | Admitting: Family Medicine

## 2019-12-01 VITALS — BP 155/90 | HR 84 | Temp 98.1°F | Resp 10 | Ht 61.5 in | Wt 163.0 lb

## 2019-12-01 DIAGNOSIS — F17200 Nicotine dependence, unspecified, uncomplicated: Secondary | ICD-10-CM

## 2019-12-01 DIAGNOSIS — E781 Pure hyperglyceridemia: Secondary | ICD-10-CM

## 2019-12-01 DIAGNOSIS — I10 Essential (primary) hypertension: Secondary | ICD-10-CM

## 2019-12-01 DIAGNOSIS — E039 Hypothyroidism, unspecified: Secondary | ICD-10-CM

## 2019-12-01 DIAGNOSIS — Z23 Encounter for immunization: Secondary | ICD-10-CM | POA: Diagnosis not present

## 2019-12-01 DIAGNOSIS — E782 Mixed hyperlipidemia: Secondary | ICD-10-CM

## 2019-12-01 DIAGNOSIS — Z0000 Encounter for general adult medical examination without abnormal findings: Secondary | ICD-10-CM | POA: Diagnosis not present

## 2019-12-01 DIAGNOSIS — E669 Obesity, unspecified: Secondary | ICD-10-CM | POA: Diagnosis not present

## 2019-12-01 DIAGNOSIS — E559 Vitamin D deficiency, unspecified: Secondary | ICD-10-CM

## 2019-12-01 DIAGNOSIS — Z8639 Personal history of other endocrine, nutritional and metabolic disease: Secondary | ICD-10-CM | POA: Diagnosis not present

## 2019-12-01 DIAGNOSIS — R7303 Prediabetes: Secondary | ICD-10-CM

## 2019-12-01 MED ORDER — SHINGRIX 50 MCG/0.5ML IM SUSR: 0.5000 mL | Freq: Once | INTRAMUSCULAR | 0 refills | Status: AC

## 2019-12-01 NOTE — Patient Instructions (Signed)
Preventive Care for Adults, Female  A healthy lifestyle and preventive care can promote health and wellness. Preventive health guidelines for women include the following key practices.   A routine yearly physical is a good way to check with your health care provider about your health and preventive screening. It is a chance to share any concerns and updates on your health and to receive a thorough exam.   Visit your dentist for a routine exam and preventive care every 6 months. Brush your teeth twice a day and floss once a day. Good oral hygiene prevents tooth decay and gum disease.   The frequency of eye exams is based on your age, health, family medical history, use of contact lenses, and other factors. Follow your health care provider's recommendations for frequency of eye exams.   Eat a healthy diet. Foods like vegetables, fruits, whole grains, low-fat dairy products, and lean protein foods contain the nutrients you need without too many calories. Decrease your intake of foods high in solid fats, added sugars, and salt. Eat the right amount of calories for you.Get information about a proper diet from your health care provider, if necessary.   Regular physical exercise is one of the most important things you can do for your health. Most adults should get at least 150 minutes of moderate-intensity exercise (any activity that increases your heart rate and causes you to sweat) each week. In addition, most adults need muscle-strengthening exercises on 2 or more days a week.   Maintain a healthy weight. The body mass index (BMI) is a screening tool to identify possible weight problems. It provides an estimate of body fat based on height and weight. Your health care provider can find your BMI, and can help you achieve or maintain a healthy weight.For adults 20 years and older:   - A BMI below 18.5 is considered underweight.   - A BMI of 18.5 to 24.9 is normal.   - A BMI of 25 to 29.9 is  considered overweight.   - A BMI of 30 and above is considered obese.   Maintain normal blood lipids and cholesterol levels by exercising and minimizing your intake of trans and saturated fats.  Eat a balanced diet with plenty of fruit and vegetables. Blood tests for lipids and cholesterol should begin at age 65 and be repeated every 5 years minimum.  If your lipid or cholesterol levels are high, you are over 40, or you are at high risk for heart disease, you may need your cholesterol levels checked more frequently.Ongoing high lipid and cholesterol levels should be treated with medicines if diet and exercise are not working.   If you smoke, find out from your health care provider how to quit. If you do not use tobacco, do not start.   Lung cancer screening is recommended for adults aged 10-80 years who are at high risk for developing lung cancer because of a history of smoking. A yearly low-dose CT scan of the lungs is recommended for people who have at least a 30-pack-year history of smoking and are a current smoker or have quit within the past 15 years. A pack year of smoking is smoking an average of 1 pack of cigarettes a day for 1 year (for example: 1 pack a day for 30 years or 2 packs a day for 15 years). Yearly screening should continue until the smoker has stopped smoking for at least 15 years. Yearly screening should be stopped for people who develop a  health problem that would prevent them from having lung cancer treatment.   If you are pregnant, do not drink alcohol. If you are breastfeeding, be very cautious about drinking alcohol. If you are not pregnant and choose to drink alcohol, do not have more than 1 drink per day. One drink is considered to be 12 ounces (355 mL) of beer, 5 ounces (148 mL) of wine, or 1.5 ounces (44 mL) of liquor.   Avoid use of street drugs. Do not share needles with anyone. Ask for help if you need support or instructions about stopping the use of  drugs.   High blood pressure causes heart disease and increases the risk of stroke. Your blood pressure should be checked at least yearly.  Ongoing high blood pressure should be treated with medicines if weight loss and exercise do not work.   If you are 25-14 years old, ask your health care provider if you should take aspirin to prevent strokes.   Diabetes screening involves taking a blood sample to check your fasting blood sugar level. This should be done once every 3 years, after age 48, if you are within normal weight and without risk factors for diabetes. Testing should be considered at a younger age or be carried out more frequently if you are overweight and have at least 1 risk factor for diabetes.   Breast cancer screening is essential preventive care for women. You should practice "breast self-awareness."  This means understanding the normal appearance and feel of your breasts and may include breast self-examination.  Any changes detected, no matter how small, should be reported to a health care provider.  Women in their 89s and 30s should have a clinical breast exam (CBE) by a health care provider as part of a regular health exam every 1 to 3 years.  After age 63, women should have a CBE every year.  Starting at age 81, women should consider having a mammogram (breast X-ray test) every year.  Women who have a family history of breast cancer should talk to their health care provider about genetic screening.  Women at a high risk of breast cancer should talk to their health care providers about having an MRI and a mammogram every year.   -Breast cancer gene (BRCA)-related cancer risk assessment is recommended for women who have family members with BRCA-related cancers. BRCA-related cancers include breast, ovarian, tubal, and peritoneal cancers. Having family members with these cancers may be associated with an increased risk for harmful changes (mutations) in the breast cancer genes BRCA1 and  BRCA2. Results of the assessment will determine the need for genetic counseling and BRCA1 and BRCA2 testing.   The Pap test is a screening test for cervical cancer. A Pap test can show cell changes on the cervix that might become cervical cancer if left untreated. A Pap test is a procedure in which cells are obtained and examined from the lower end of the uterus (cervix).   - Women should have a Pap test starting at age 13.   - Between ages 51 and 22, Pap tests should be repeated every 2 years.   - Beginning at age 70, you should have a Pap test every 3 years as long as the past 3 Pap tests have been normal.   - Some women have medical problems that increase the chance of getting cervical cancer. Talk to your health care provider about these problems. It is especially important to talk to your health care provider if a  new problem develops soon after your last Pap test. In these cases, your health care provider may recommend more frequent screening and Pap tests.   - The above recommendations are the same for women who have or have not gotten the vaccine for human papillomavirus (HPV).   - If you had a hysterectomy for a problem that was not cancer or a condition that could lead to cancer, then you no longer need Pap tests. Even if you no longer need a Pap test, a regular exam is a good idea to make sure no other problems are starting.   - If you are between ages 8 and 94 years, and you have had normal Pap tests going back 10 years, you no longer need Pap tests. Even if you no longer need a Pap test, a regular exam is a good idea to make sure no other problems are starting.   - If you have had past treatment for cervical cancer or a condition that could lead to cancer, you need Pap tests and screening for cancer for at least 20 years after your treatment.   - If Pap tests have been discontinued, risk factors (such as a new sexual partner) need to be reassessed to determine if screening should  be resumed.   - The HPV test is an additional test that may be used for cervical cancer screening. The HPV test looks for the virus that can cause the cell changes on the cervix. The cells collected during the Pap test can be tested for HPV. The HPV test could be used to screen women aged 9 years and older, and should be used in women of any age who have unclear Pap test results. After the age of 48, women should have HPV testing at the same frequency as a Pap test.   Colorectal cancer can be detected and often prevented. Most routine colorectal cancer screening begins at the age of 94 years and continues through age 56 years. However, your health care provider may recommend screening at an earlier age if you have risk factors for colon cancer. On a yearly basis, your health care provider may provide home test kits to check for hidden blood in the stool.  Use of a small camera at the end of a tube, to directly examine the colon (sigmoidoscopy or colonoscopy), can detect the earliest forms of colorectal cancer. Talk to your health care provider about this at age 47, when routine screening begins. Direct exam of the colon should be repeated every 5 -10 years through age 65 years, unless early forms of pre-cancerous polyps or small growths are found.   People who are at an increased risk for hepatitis B should be screened for this virus. You are considered at high risk for hepatitis B if:  -You were born in a country where hepatitis B occurs often. Talk with your health care provider about which countries are considered high risk.  - Your parents were born in a high-risk country and you have not received a shot to protect against hepatitis B (hepatitis B vaccine).  - You have HIV or AIDS.  - You use needles to inject street drugs.  - You live with, or have sex with, someone who has Hepatitis B.  - You get hemodialysis treatment.  - You take certain medicines for conditions like cancer, organ  transplantation, and autoimmune conditions.   Hepatitis C blood testing is recommended for all people born from [birth year] through 1965 and any individual  with known risks for hepatitis C.   Practice safe sex. Use condoms and avoid high-risk sexual practices to reduce the spread of sexually transmitted infections (STIs). STIs include gonorrhea, chlamydia, syphilis, trichomonas, herpes, HPV, and human immunodeficiency virus (HIV). Herpes, HIV, and HPV are viral illnesses that have no cure. They can result in disability, cancer, and death. Sexually active women aged 17 years and younger should be checked for chlamydia. Older women with new or multiple partners should also be tested for chlamydia. Testing for other STIs is recommended if you are sexually active and at increased risk.   Osteoporosis is a disease in which the bones lose minerals and strength with aging. This can result in serious bone fractures or breaks. The risk of osteoporosis can be identified using a bone density scan. Women ages 38 years and over and women at risk for fractures or osteoporosis should discuss screening with their health care providers. Ask your health care provider whether you should take a calcium supplement or vitamin D to There are also several preventive steps women can take to avoid osteoporosis and resulting fractures or to keep osteoporosis from worsening. -->Recommendations include:  Eat a balanced diet high in fruits, vegetables, calcium, and vitamins.  Get enough calcium. The recommended total intake of is 1,200 mg daily; for best absorption, if taking supplements, divide doses into 250-500 mg doses throughout the day. Of the two types of calcium, calcium carbonate is best absorbed when taken with food but calcium citrate can be taken on an empty stomach.  Get enough vitamin D. [employer] and the [place] recommend at least 1,000 IU per day for women age 28 and over who are at risk of vitamin D  deficiency. Vitamin D deficiency can be caused by inadequate sun exposure (for example, those who live in [place]).  Avoid alcohol and smoking. Heavy alcohol intake (more than 7 drinks per week) increases the risk of falls and hip fracture and women smokers tend to lose bone more rapidly and have lower bone mass than nonsmokers. Stopping smoking is one of the most important changes women can make to improve their health and decrease risk for disease.  Be physically active every day. Weight-bearing exercise (for example, fast walking, hiking, jogging, and weight training) may strengthen bones or slow the rate of bone loss that comes with aging. Balancing and muscle-strengthening exercises can reduce the risk of falling and fracture.  Consider therapeutic medications. Currently, several types of effective drugs are available. Healthcare providers can recommend the type most appropriate for each woman.  Eliminate environmental factors that may contribute to accidents. Falls cause nearly 90% of all osteoporotic fractures, so reducing this risk is an important bone-health strategy. Measures include ample lighting, removing obstructions to walking, using nonskid rugs on floors, and placing mats and/or grab bars in showers.  Be aware of medication side effects. Some common medicines make bones weaker. These include a type of steroid drug called glucocorticoids used for arthritis and asthma, some antiseizure drugs, certain sleeping pills, treatments for endometriosis, and some cancer drugs. An overactive thyroid gland or using too much thyroid hormone for an underactive thyroid can also be a problem. If you are taking these medicines, talk to your doctor about what you can do to help protect your bones.reduce the rate of osteoporosis.    Menopause can be associated with physical symptoms and risks. Hormone replacement therapy is available to decrease symptoms and risks. You should talk to your  health care provider  about whether hormone replacement therapy is right for you.   Use sunscreen. Apply sunscreen liberally and repeatedly throughout the day. You should seek shade when your shadow is shorter than you. Protect yourself by wearing long sleeves, pants, a wide-brimmed hat, and sunglasses year round, whenever you are outdoors.   Once a month, do a whole body skin exam, using a mirror to look at the skin on your back. Tell your health care provider of new moles, moles that have irregular borders, moles that are larger than a pencil eraser, or moles that have changed in shape or color.   -Stay current with required vaccines (immunizations).   Influenza vaccine. All adults should be immunized every year.  Tetanus, diphtheria, and acellular pertussis (Td, Tdap) vaccine. Pregnant women should receive 1 dose of Tdap vaccine during each pregnancy. The dose should be obtained regardless of the length of time since the last dose. Immunization is preferred during the 27th 36th week of gestation. An adult who has not previously received Tdap or who does not know her vaccine status should receive 1 dose of Tdap. This initial dose should be followed by tetanus and diphtheria toxoids (Td) booster doses every 10 years. Adults with an unknown or incomplete history of completing a 3-dose immunization series with Td-containing vaccines should begin or complete a primary immunization series including a Tdap dose. Adults should receive a Td booster every 10 years.  Varicella vaccine. An adult without evidence of immunity to varicella should receive 2 doses or a second dose if she has previously received 1 dose. Pregnant females who do not have evidence of immunity should receive the first dose after pregnancy. This first dose should be obtained before leaving the health care facility. The second dose should be obtained 4 8 weeks after the first dose.  Human papillomavirus (HPV) vaccine. Females aged 13 26  years who have not received the vaccine previously should obtain the 3-dose series. The vaccine is not recommended for use in pregnant females. However, pregnancy testing is not needed before receiving a dose. If a female is found to be pregnant after receiving a dose, no treatment is needed. In that case, the remaining doses should be delayed until after the pregnancy. Immunization is recommended for any person with an immunocompromised condition through the age of 26 years if she did not get any or all doses earlier. During the 3-dose series, the second dose should be obtained 4 8 weeks after the first dose. The third dose should be obtained 24 weeks after the first dose and 16 weeks after the second dose.  Zoster vaccine. One dose is recommended for adults aged 60 years or older unless certain conditions are present.  Measles, mumps, and rubella (MMR) vaccine. Adults born before 1957 generally are considered immune to measles and mumps. Adults born in 1957 or later should have 1 or more doses of MMR vaccine unless there is a contraindication to the vaccine or there is laboratory evidence of immunity to each of the three diseases. A routine second dose of MMR vaccine should be obtained at least 28 days after the first dose for students attending postsecondary schools, health care workers, or international travelers. People who received inactivated measles vaccine or an unknown type of measles vaccine during 1963 1967 should receive 2 doses of MMR vaccine. People who received inactivated mumps vaccine or an unknown type of mumps vaccine before 1979 and are at high risk for mumps infection should consider immunization with 2 doses of   MMR vaccine. For females of childbearing age, rubella immunity should be determined. If there is no evidence of immunity, females who are not pregnant should be vaccinated. If there is no evidence of immunity, females who are pregnant should delay immunization until after pregnancy.  Unvaccinated health care workers born before 1957 who lack laboratory evidence of measles, mumps, or rubella immunity or laboratory confirmation of disease should consider measles and mumps immunization with 2 doses of MMR vaccine or rubella immunization with 1 dose of MMR vaccine.  Pneumococcal 13-valent conjugate (PCV13) vaccine. When indicated, a person who is uncertain of her immunization history and has no record of immunization should receive the PCV13 vaccine. An adult aged 19 years or older who has certain medical conditions and has not been previously immunized should receive 1 dose of PCV13 vaccine. This PCV13 should be followed with a dose of pneumococcal polysaccharide (PPSV23) vaccine. The PPSV23 vaccine dose should be obtained at least 8 weeks after the dose of PCV13 vaccine. An adult aged 19 years or older who has certain medical conditions and previously received 1 or more doses of PPSV23 vaccine should receive 1 dose of PCV13. The PCV13 vaccine dose should be obtained 1 or more years after the last PPSV23 vaccine dose.  Pneumococcal polysaccharide (PPSV23) vaccine. When PCV13 is also indicated, PCV13 should be obtained first. All adults aged 65 years and older should be immunized. An adult younger than age 65 years who has certain medical conditions should be immunized. Any person who resides in a nursing home or long-term care facility should be immunized. An adult smoker should be immunized. People with an immunocompromised condition and certain other conditions should receive both PCV13 and PPSV23 vaccines. People with human immunodeficiency virus (HIV) infection should be immunized as soon as possible after diagnosis. Immunization during chemotherapy or radiation therapy should be avoided. Routine use of PPSV23 vaccine is not recommended for American Indians, [place] Natives, or people younger than 65 years unless there are medical conditions that require PPSV23 vaccine. When indicated,  people who have unknown immunization and have no record of immunization should receive PPSV23 vaccine. One-time revaccination 5 years after the first dose of PPSV23 is recommended for people aged 19 64 years who have chronic kidney failure, nephrotic syndrome, asplenia, or immunocompromised conditions. People who received 1 2 doses of PPSV23 before age 65 years should receive another dose of PPSV23 vaccine at age 65 years or later if at least 5 years have passed since the previous dose. Doses of PPSV23 are not needed for people immunized with PPSV23 at or after age 65 years.  Meningococcal vaccine. Adults with asplenia or persistent complement component deficiencies should receive 2 doses of quadrivalent meningococcal conjugate (MenACWY-D) vaccine. The doses should be obtained at least 2 months apart. Microbiologists working with certain meningococcal bacteria, military recruits, people at risk during an outbreak, and people who travel to or live in countries with a high rate of meningitis should be immunized. A first-year college student up through age 21 years who is living in a residence hall should receive a dose if she did not receive a dose on or after her 16th birthday. Adults who have certain high-risk conditions should receive one or more doses of vaccine.  Hepatitis A vaccine. Adults who wish to be protected from this disease, have certain high-risk conditions, work with hepatitis A-infected animals, work in hepatitis A research labs, or travel to or work in countries with a high rate of hepatitis A should be   immunized. Adults who were previously unvaccinated and who anticipate close contact with an international adoptee during the first 60 days after arrival in the [place] from a country with a high rate of hepatitis A should be immunized.  Hepatitis B vaccine.  Adults who wish to be protected from this disease, have certain high-risk conditions, may be exposed to blood or other infectious  body fluids, are household contacts or sex partners of hepatitis B positive people, are clients or workers in certain care facilities, or travel to or work in countries with a high rate of hepatitis B should be immunized.  Haemophilus influenzae type b (Hib) vaccine. A previously unvaccinated person with asplenia or sickle cell disease or having a scheduled splenectomy should receive 1 dose of Hib vaccine. Regardless of previous immunization, a recipient of a hematopoietic stem cell transplant should receive a 3-dose series 6 12 months after her successful transplant. Hib vaccine is not recommended for adults with HIV infection.  Preventive Services / Frequency Ages 19 to 39years  Blood pressure check.** / Every 1 to 2 years.  Lipid and cholesterol check.** / Every 5 years beginning at age 20.  Clinical breast exam.** / Every 3 years for women in their 20s and 30s.  BRCA-related cancer risk assessment.** / For women who have family members with a BRCA-related cancer (breast, ovarian, tubal, or peritoneal cancers).  Pap test.** / Every 2 years from ages 21 through 29. Every 3 years starting at age 30 through age 65 or 70 with a history of 3 consecutive normal Pap tests.  HPV screening.** / Every 3 years from ages 30 through ages 65 to 70 with a history of 3 consecutive normal Pap tests.  Hepatitis C blood test.** / For any individual with known risks for hepatitis C.  Skin self-exam. / Monthly.  Influenza vaccine. / Every year.  Tetanus, diphtheria, and acellular pertussis (Tdap, Td) vaccine.** / Consult your health care provider. Pregnant women should receive 1 dose of Tdap vaccine during each pregnancy. 1 dose of Td every 10 years.  Varicella vaccine.** / Consult your health care provider. Pregnant females who do not have evidence of immunity should receive the first dose after pregnancy.  HPV vaccine. / 3 doses over 6 months, if 26 and younger. The vaccine is not recommended for use in  pregnant females. However, pregnancy testing is not needed before receiving a dose.  Measles, mumps, rubella (MMR) vaccine.** / You need at least 1 dose of MMR if you were born in 1957 or later. You may also need a 2nd dose. For females of childbearing age, rubella immunity should be determined. If there is no evidence of immunity, females who are not pregnant should be vaccinated. If there is no evidence of immunity, females who are pregnant should delay immunization until after pregnancy.  Pneumococcal 13-valent conjugate (PCV13) vaccine.** / Consult your health care provider.  Pneumococcal polysaccharide (PPSV23) vaccine.** / 1 to 2 doses if you smoke cigarettes or if you have certain conditions.  Meningococcal vaccine.** / 1 dose if you are age 19 to 21 years and a first-year college student living in a residence hall, or have one of several medical conditions, you need to get vaccinated against meningococcal disease. You may also need additional booster doses.  Hepatitis A vaccine.** / Consult your health care provider.  Hepatitis B vaccine.** / Consult your health care provider.  Haemophilus influenzae type b (Hib) vaccine.** / Consult your health care provider.  Ages 40 to 64years    Blood pressure check.** / Every 1 to 2 years.  Lipid and cholesterol check.** / Every 5 years beginning at age 22 years.  Lung cancer screening. / Every year if you are aged 81 80 years and have a 30-pack-year history of smoking and currently smoke or have quit within the past 15 years. Yearly screening is stopped once you have quit smoking for at least 15 years or develop a health problem that would prevent you from having lung cancer treatment.  Clinical breast exam.** / Every year after age 100 years.  BRCA-related cancer risk assessment.** / For women who have family members with a BRCA-related cancer (breast, ovarian, tubal, or peritoneal cancers).  Mammogram.** / Every year beginning at age 74  years and continuing for as long as you are in good health. Consult with your health care provider.  Pap test.** / Every 3 years starting at age 28 years through age 30 or 38 years with a history of 3 consecutive normal Pap tests.  HPV screening.** / Every 3 years from ages 85 years through ages 91 to 58 years with a history of 3 consecutive normal Pap tests.  Fecal occult blood test (FOBT) of stool. / Every year beginning at age 60 years and continuing until age 55 years. You may not need to do this test if you get a colonoscopy every 10 years.  Flexible sigmoidoscopy or colonoscopy.** / Every 5 years for a flexible sigmoidoscopy or every 10 years for a colonoscopy beginning at age 3 years and continuing until age 56 years.  Hepatitis C blood test.** / For all people born from [birth year] through 1967 and any individual with known risks for hepatitis C.  Skin self-exam. / Monthly.  Influenza vaccine. / Every year.  Tetanus, diphtheria, and acellular pertussis (Tdap/Td) vaccine.** / Consult your health care provider. Pregnant women should receive 1 dose of Tdap vaccine during each pregnancy. 1 dose of Td every 10 years.  Varicella vaccine.** / Consult your health care provider. Pregnant females who do not have evidence of immunity should receive the first dose after pregnancy.  Zoster vaccine.** / 1 dose for adults aged 50 years or older.  Measles, mumps, rubella (MMR) vaccine.** / You need at least 1 dose of MMR if you were born in 1957 or later. You may also need a 2nd dose. For females of childbearing age, rubella immunity should be determined. If there is no evidence of immunity, females who are not pregnant should be vaccinated. If there is no evidence of immunity, females who are pregnant should delay immunization until after pregnancy.  Pneumococcal 13-valent conjugate (PCV13) vaccine.** / Consult your health care provider.  Pneumococcal polysaccharide (PPSV23) vaccine.** / 1 to 2 doses if  you smoke cigarettes or if you have certain conditions.  Meningococcal vaccine.** / Consult your health care provider.  Hepatitis A vaccine.** / Consult your health care provider.  Hepatitis B vaccine.** / Consult your health care provider.  Haemophilus influenzae type b (Hib) vaccine.** / Consult your health care provider.  Ages 47 years and over  Blood pressure check.** / Every 1 to 2 years.  Lipid and cholesterol check.** / Every 5 years beginning at age 55 years.  Lung cancer screening. / Every year if you are aged 57 80 years and have a 30-pack-year history of smoking and currently smoke or have quit within the past 15 years. Yearly screening is stopped once you have quit smoking for at least 15 years or develop a health problem that  would prevent you from having lung cancer treatment.  Clinical breast exam.** / Every year after age 68 years.  BRCA-related cancer risk assessment.** / For women who have family members with a BRCA-related cancer (breast, ovarian, tubal, or peritoneal cancers).  Mammogram.** / Every year beginning at age 47 years and continuing for as long as you are in good health. Consult with your health care provider.  Pap test.** / Every 3 years starting at age 59 years through age 26 or 26 years with 3 consecutive normal Pap tests. Testing can be stopped between 65 and 70 years with 3 consecutive normal Pap tests and no abnormal Pap or HPV tests in the past 10 years.  HPV screening.** / Every 3 years from ages 71 years through ages 19 or 96 years with a history of 3 consecutive normal Pap tests. Testing can be stopped between 65 and 70 years with 3 consecutive normal Pap tests and no abnormal Pap or HPV tests in the past 10 years.  Fecal occult blood test (FOBT) of stool. / Every year beginning at age 59 years and continuing until age 7 years. You may not need to do this test if you get a colonoscopy every 10 years.  Flexible sigmoidoscopy or colonoscopy.** /  Every 5 years for a flexible sigmoidoscopy or every 10 years for a colonoscopy beginning at age 28 years and continuing until age 27 years.  Hepatitis C blood test.** / For all people born from [birth year] through 1967 and any individual with known risks for hepatitis C.  Osteoporosis screening.** / A one-time screening for women ages 6 years and over and women at risk for fractures or osteoporosis.  Skin self-exam. / Monthly.  Influenza vaccine. / Every year.  Tetanus, diphtheria, and acellular pertussis (Tdap/Td) vaccine.** / 1 dose of Td every 10 years.  Varicella vaccine.** / Consult your health care provider.  Zoster vaccine.** / 1 dose for adults aged 20 years or older.  Pneumococcal 13-valent conjugate (PCV13) vaccine.** / Consult your health care provider.  Pneumococcal polysaccharide (PPSV23) vaccine.** / 1 dose for all adults aged 95 years and older.  Meningococcal vaccine.** / Consult your health care provider.  Hepatitis A vaccine.** / Consult your health care provider.  Hepatitis B vaccine.** / Consult your health care provider.  Haemophilus influenzae type b (Hib) vaccine.** / Consult your health care provider. ** Family history and personal history of risk and conditions may change your health care provider's recommendations. Document Released: 02/09/2002 Document Revised: 10/04/2013  [facility] Patient Information 2014 [place], [place].   EXERCISE AND DIET:  We recommended that you start or continue a regular exercise program for good health. Regular exercise means any activity that makes your heart beat faster and makes you sweat.  We recommend exercising at least 30 minutes per day at least 3 days a week, preferably 5.  We also recommend a diet low in fat and sugar / carbohydrates.  Inactivity, poor dietary choices and obesity can cause diabetes, heart attack, stroke, and kidney damage, among others.     ALCOHOL AND SMOKING:  Women should limit their alcohol intake to no  more than 7 drinks/beers/glasses of wine (combined, not each!) per week. Moderation of alcohol intake to this level decreases your risk of breast cancer and liver damage.  ( And of course, no recreational drugs are part of a healthy lifestyle.)  Also, you should not be smoking at all or even being exposed to second hand smoke. Most people know smoking can  cause cancer, and various heart and lung diseases, but did you know it also contributes to weakening of your bones?  Aging of your skin?  Yellowing of your teeth and nails?   CALCIUM AND VITAMIN D:  Adequate intake of calcium and Vitamin D are recommended.  The recommendations for exact amounts of these supplements seem to change often, but generally speaking 600 mg of calcium (either carbonate or citrate) and 800 units of Vitamin D per day seems prudent. Certain women may benefit from higher intake of Vitamin D.  If you are among these women, your doctor will have told you during your visit.     PAP SMEARS:  Pap smears, to check for cervical cancer or precancers,  have traditionally been done yearly, although recent scientific advances have shown that most women can have pap smears less often.  However, every woman still should have a physical exam from her gynecologist or primary care physician every year. It will include a breast check, inspection of the vulva and vagina to check for abnormal growths or skin changes, a visual exam of the cervix, and then an exam to evaluate the size and shape of the uterus and ovaries.  And after 70 years of age, a rectal exam is indicated to check for rectal cancers. We will also provide age appropriate advice regarding health maintenance, like when you should have certain vaccines, screening for sexually transmitted diseases, bone density testing, colonoscopy, mammograms, etc.    MAMMOGRAMS:  All women over 40 years old should have a yearly mammogram. Many facilities now offer a "3D" mammogram, which may cost  around $50 extra out of pocket. If possible,  we recommend you accept the option to have the 3D mammogram performed.  It both reduces the number of women who will be called back for extra views which then turn out to be normal, and it is better than the routine mammogram at detecting truly abnormal areas.     COLONOSCOPY:  Colonoscopy to screen for colon cancer is recommended for all women at age 27.  We know, you hate the idea of the prep.  We agree, BUT, having colon cancer and not knowing it is worse!!  Colon cancer so often starts as a polyp that can be seen and removed at colonscopy, which can quite literally save your life!  And if your first colonoscopy is normal and you have no family history of colon cancer, most women don't have to have it again for 10 years.  Once every ten years, you can do something that may end up saving your life, right?  We will be happy to help you get it scheduled when you are ready.  Be sure to check your insurance coverage so you understand how much it will cost.  It may be covered as a preventative service at no cost, but you should check your particular policy.

## 2019-12-01 NOTE — Progress Notes (Signed)
Pt seen in person; in office today.   Subjective:   Andrea Floyd is a 68 y.o. female who presents for Medicare Annual (Subsequent) preventive examination.     Objective:    Vitals: BP (!) 155/90 (BP Location: Left Arm, Patient Position: Sitting, Cuff Size: Large)   Pulse 84   Temp 98.1 F (36.7 C) (Oral)   Resp 10   Ht 5' 1.5" (1.562 m)   Wt 163 lb (73.9 kg)   SpO2 96%   BMI 30.30 kg/m   Body mass index is 30.3 kg/m.  Advanced Directives 07/05/2017 07/03/2016 08/20/2011 08/13/2011  Does Patient Have a Medical Advance Directive? No No Patient does not have advance directive;Patient would not like information Patient does not have advance directive  Would patient like information on creating a medical advance directive? - Yes - Educational materials given - -  Pre-existing out of facility DNR order (yellow form or pink MOST form) - - No -    Tobacco Social History   Tobacco Use  Smoking Status Current Every Day Smoker  . Packs/day: 0.25  . Years: 40.00  . Pack years: 10.00  . Types: Cigarettes  Smokeless Tobacco Never Used     Ready to quit: Not Answered Counseling given: Not Answered   Past Medical History:  Diagnosis Date  . 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 Hypertension   . SUI (stress urinary incontinence, female) 08/20/2011  . Tobacco use disorder 07/03/2016  . Vitamin D insufficiency 07/03/2016   Past Surgical History:  Procedure Laterality Date  . ABDOMINAL HYSTERECTOMY    . ANTERIOR AND POSTERIOR REPAIR  08/20/2011   Procedure: ANTERIOR (CYSTOCELE) AND POSTERIOR REPAIR (RECTOCELE);  Surgeon: Robley FriesVaishali R Mody;  Location: [facility];  Service: Gynecology;  Laterality: Bilateral;  With Cystoscopy  . BACK SURGERY    . BLADDER SUSPENSION  08/20/2011   Procedure: TRANSVAGINAL TAPE (TVT) PROCEDURE;  Surgeon: Robley FriesVaishali R Mody;  Location: [facility];  Service: Gynecology;  Laterality: Bilateral;  . CHOLECYSTECTOMY    . TONSILLECTOMY    . VAGINAL  HYSTERECTOMY  08/20/2011   Procedure: HYSTERECTOMY VAGINAL;  Surgeon: Robley FriesVaishali R Mody;  Location: [facility];  Service: Gynecology;  Laterality: N/A;  With Removal Of Pessary.   Family History  Problem Relation Age of Onset  . Hypertension Mother   . Alzheimer's disease Mother   . Diabetes Father   . Hypertension Father   . Cancer Sister        colon  . Hypertension Brother   . Diabetes Son   . Hypertension Brother   . Hypertension Brother    Social History   Socioeconomic History  . Marital status: Married    Spouse name: Not on file  . Number of children: Not on file  . Years of education: Not on file  . Highest education level: Not on file  Occupational History  . Not on file  Social Needs  . Financial resource strain: Not on file  . Food insecurity    Worry: Not on file    Inability: Not on file  . Transportation needs    Medical: Not on file    Non-medical: Not on file  Tobacco Use  . Smoking status: Current Every Day Smoker    Packs/day: 0.25    Years: 40.00    Pack years: 10.00    Types: Cigarettes  . Smokeless tobacco: Never Used  Substance and Sexual Activity  . Alcohol use: Yes  Alcohol/week: 1.0 - 2.0 standard drinks    Types: 1 - 2 Glasses of wine per week    Comment: daily  . Drug use: No  . Sexual activity: Yes  Lifestyle  . Physical activity    Days per week: Not on file    Minutes per session: Not on file  . Stress: Not on file  Relationships  . Social Herbalist on phone: Not on file    Gets together: Not on file    Attends religious service: Not on file    Active member of club or organization: Not on file    Attends meetings of clubs or organizations: Not on file    Relationship status: Not on file  Other Topics Concern  . Not on file  Social History Narrative   5 pregnancies   4 living    1 miscarriage    Outpatient Encounter Medications as of 12/01/2019  Medication Sig  . amLODipine (NORVASC) 10 MG tablet Take 1 tablet  (10 mg total) by mouth daily.  Marland Kitchen aspirin 81 MG tablet Take 81 mg by mouth daily.  Marland Kitchen atorvastatin (LIPITOR) 40 MG tablet TAKE 1 TABLET BY MOUTH AT BEDTIME.  . Calcium Carbonate-Vitamin D 600-400 MG-UNIT tablet Take 1 tablet by mouth 2 (two) times daily.  . Cholecalciferol (VITAMIN D3) 125 MCG (5000 UT) TABS Pt takes 4,000 IU OTC vitamin D3 daily.  . Glucosamine 500 MG CAPS Take 2 each by mouth daily.   Marland Kitchen levothyroxine (SYNTHROID) 25 MCG tablet Take 1 tablet (25 mcg total) by mouth daily before breakfast.  . Multiple Vitamin (MULTIVITAMIN) capsule Take 1 capsule by mouth daily.  . [EXPIRED] Zoster Vaccine Adjuvanted [facility]) injection Inject 0.5 mLs into the muscle once for 1 dose.  . [DISCONTINUED] olmesartan (BENICAR) 20 MG tablet TAKE 1/2 TABLET BY MOUTH DAILY. (Patient not taking: Reported on 12/01/2019)   No facility-administered encounter medications on file as of 12/01/2019.     Activities of Daily Living In your present state of health, do you have any difficulty performing the following activities: 12/01/2019  Hearing? N  Vision? N  Difficulty concentrating or making decisions? N  Walking or climbing stairs? N  Dressing or bathing? N  Doing errands, shopping? N  Some recent data might be hidden    Patient Care Team: Mellody Dance, DO as PCP - General (Family Medicine) Pleasant, Eppie Gibson, RN as [place] Management    Assessment:   This is a routine wellness examination for [name].  Exercise Activities and Dietary recommendations    Goals   None     Fall Risk Fall Risk  12/01/2019 09/11/2019 09/20/2018 07/05/2017 07/03/2016  Falls in the past year? 0 0 No No Yes  Number falls in past yr: 0 - - - 2 or more  Injury with Fall? 0 - - - No  Follow up Falls evaluation completed - - - -   Is the patient's home free of loose throw rugs in walkways, pet beds, electrical cords, etc?   yes      Grab bars in the bathroom? yes      Handrails on the stairs?    no      Adequate lighting?   yes  Timed Get Up and Go performed: WNL  Depression Screen PHQ 2/9 Scores 12/01/2019 09/20/2018 06/14/2018 02/14/2018  PHQ - 2 Score 0 0 0 0  PHQ- 9 Score 0 0 1 2     Cognitive Function  6CIT Screen 12/01/2019  What Year? 0 points  What month? 0 points  What time? 0 points  Count back from 20 0 points  Months in reverse 0 points  Repeat phrase 0 points  Total Score 0    Immunization History  Administered Date(s) Administered  . Fluad Quad(high Dose 65+) 10/12/2019  . Influenza, High Dose Seasonal PF 10/05/2017  . Influenza,inj,Quad PF,6+ Mos 09/29/2016  . Pneumococcal Conjugate-13 06/10/2015  . Pneumococcal Polysaccharide-23 07/03/2016  . Tdap 10/07/2007, 12/01/2019  . Zoster 10/22/2015    Qualifies for Shingles Vaccine?Ordered for patient to have done at pharmacy. Patient is UTD on pneumonia vaccine and influenza vaccine.   Screening Tests Health Maintenance  Topic Date Due  . MAMMOGRAM  03/13/2021  . COLONOSCOPY  01/21/2026  . TETANUS/TDAP  11/30/2029  . INFLUENZA VACCINE  Completed  . DEXA SCAN  Completed  . Hepatitis C Screening  Completed  . PNA vac Low Risk Adult  Completed    Cancer Screenings: Lung: Low Dose CT Chest recommended if Age 35-80 years, 30 pack-year currently smoking OR have quit w/in 15years. Patient does not qualify. Breast:  Up to date on Mammogram? Yes   Up to date of Bone Density/Dexa? Yes Colorectal: Patient is tracking down her last colonoscopy. She thinks this was done in 2016.  - pt is a retired [employer] fundraiser and has a sister with h/o colonic pre-CA polyps; per pt due to hx, she gets scoped q 5 yrs  Additional Screenings: Hepatitis C Screening: UTD  HIV Screening: Patient declined.      Plan:    I have personally reviewed and noted the following in the patient's chart:   . Medical and social history . Use of alcohol, tobacco or illicit drugs  . Current medications and supplements . Functional ability and  status . Nutritional status . Physical activity . Advanced directives . List of other physicians . Hospitalizations, surgeries, and ER visits in previous 12 months . Vitals . Screenings to include cognitive, depression, and falls . Referrals and appointments  In addition, I have reviewed and discussed with patient certain preventive protocols, quality metrics, and best practice recommendations. A written personalized care plan for preventive services as well as general preventive health recommendations were provided to patient.

## 2019-12-02 LAB — COMPREHENSIVE METABOLIC PANEL
ALT: 31 IU/L (ref 0–32)
AST: 28 IU/L (ref 0–40)
Albumin/Globulin Ratio: 1.5 (ref 1.2–2.2)
Albumin: 4.4 g/dL (ref 3.8–4.8)
Alkaline Phosphatase: 93 IU/L (ref 39–117)
BUN/Creatinine Ratio: 16 (ref 12–28)
BUN: 14 mg/dL (ref 8–27)
Bilirubin Total: 0.6 mg/dL (ref 0.0–1.2)
CO2: 21 mmol/L (ref 20–29)
Calcium: 9.4 mg/dL (ref 8.7–10.3)
Chloride: 102 mmol/L (ref 96–106)
Creatinine, Ser: 0.85 mg/dL (ref 0.57–1.00)
GFR calc Af Amer: 80 mL/min/{1.73_m2} (ref >59)
GFR calc non Af Amer: 70 mL/min/{1.73_m2} (ref >59)
Globulin, Total: 3 g/dL (ref 1.5–4.5)
Glucose: 92 mg/dL (ref 65–99)
Potassium: 4.6 mmol/L (ref 3.5–5.2)
Sodium: 139 mmol/L (ref 134–144)
Total Protein: 7.4 g/dL (ref 6.0–8.5)

## 2019-12-02 LAB — CBC WITH DIFFERENTIAL/PLATELET
Basophils Absolute: 0 10*3/uL (ref 0.0–0.2)
Basos: 0 %
EOS (ABSOLUTE): 0.3 10*3/uL (ref 0.0–0.4)
Eos: 4 %
Hematocrit: 43.4 % (ref 34.0–46.6)
Hemoglobin: 14.6 g/dL (ref 11.1–15.9)
Immature Grans (Abs): 0 10*3/uL (ref 0.0–0.1)
Immature Granulocytes: 0 %
Lymphocytes Absolute: 2.2 10*3/uL (ref 0.7–3.1)
Lymphs: 29 %
MCH: 31.3 pg (ref 26.6–33.0)
MCHC: 33.6 g/dL (ref 31.5–35.7)
MCV: 93 fL (ref 79–97)
Monocytes Absolute: 0.7 10*3/uL (ref 0.1–0.9)
Monocytes: 9 %
Neutrophils Absolute: 4.4 10*3/uL (ref 1.4–7.0)
Neutrophils: 58 %
Platelets: 409 10*3/uL (ref 150–450)
RBC: 4.66 x10E6/uL (ref 3.77–5.28)
RDW: 13 % (ref 11.7–15.4)
WBC: 7.7 10*3/uL (ref 3.4–10.8)

## 2019-12-02 LAB — LIPID PANEL
Chol/HDL Ratio: 3.2 ratio (ref 0.0–4.4)
Cholesterol, Total: 172 mg/dL (ref 100–199)
HDL: 53 mg/dL (ref >39)
LDL Chol Calc (NIH): 85 mg/dL (ref 0–99)
Triglycerides: 204 mg/dL — ABNORMAL HIGH (ref 0–149)
VLDL Cholesterol Cal: 34 mg/dL (ref 5–40)

## 2019-12-02 LAB — HEMOGLOBIN A1C
Est. average glucose Bld gHb Est-mCnc: 117 mg/dL
Hgb A1c MFr Bld: 5.7 % — ABNORMAL HIGH (ref 4.8–5.6)

## 2019-12-02 LAB — TSH: TSH: 3.22 u[IU]/mL (ref 0.450–4.500)

## 2019-12-02 LAB — VITAMIN D 25 HYDROXY (VIT D DEFICIENCY, FRACTURES): Vit D, 25-Hydroxy: 42.9 ng/mL (ref 30.0–100.0)

## 2019-12-02 LAB — T4, FREE: Free T4: 1.08 ng/dL (ref 0.82–1.77)

## 2019-12-11 ENCOUNTER — Other Ambulatory Visit: Payer: Self-pay | Admitting: Adult Health

## 2019-12-11 DIAGNOSIS — E039 Hypothyroidism, unspecified: Secondary | ICD-10-CM

## 2019-12-11 DIAGNOSIS — R6889 Other general symptoms and signs: Secondary | ICD-10-CM

## 2020-01-03 ENCOUNTER — Encounter: Payer: Self-pay | Admitting: Family Medicine

## 2020-01-18 ENCOUNTER — Other Ambulatory Visit: Payer: Self-pay | Admitting: Family Medicine

## 2020-01-18 DIAGNOSIS — E781 Pure hyperglyceridemia: Secondary | ICD-10-CM

## 2020-01-18 DIAGNOSIS — E782 Mixed hyperlipidemia: Secondary | ICD-10-CM

## 2020-02-10 ENCOUNTER — Ambulatory Visit: Payer: PPO | Attending: Internal Medicine

## 2020-02-10 DIAGNOSIS — Z23 Encounter for immunization: Secondary | ICD-10-CM

## 2020-02-10 NOTE — Progress Notes (Signed)
   Covid-19 Vaccination Clinic  Name:  Andrea Floyd    MRN: [id number] DOB: 11-05-1951  02/10/2020  Andrea Floyd was observed post Covid-19 immunization for 15 minutes without incidence. She was provided with Vaccine Information Sheet and instruction to access the V-Safe system.   Andrea Floyd was instructed to call 911 with any severe reactions post vaccine: Marland Kitchen Difficulty breathing  . Swelling of your face and throat  . A fast heartbeat  . A bad rash all over your body  . Dizziness and weakness    Immunizations Administered    Name Date Dose VIS Date Route   Pfizer COVID-19 Vaccine 02/10/2020  9:20 AM 0.3 mL 12/08/2019 Intramuscular   Manufacturer: [employer], [employer]   Lot: [id number]   NDC: [address]-8

## 2020-03-04 ENCOUNTER — Ambulatory Visit: Payer: PPO | Attending: Internal Medicine

## 2020-03-04 DIAGNOSIS — Z23 Encounter for immunization: Secondary | ICD-10-CM | POA: Insufficient documentation

## 2020-03-04 NOTE — Progress Notes (Signed)
   Covid-19 Vaccination Clinic  Name:  Andrea Floyd    MRN: [id number] DOB: [birth year]  03/04/2020  Andrea Floyd was observed post Covid-19 immunization for 15 minutes without incident. She was provided with Vaccine Information Sheet and instruction to access the V-Safe system.   Andrea Floyd was instructed to call 911 with any severe reactions post vaccine: Marland Kitchen Difficulty breathing  . Swelling of face and throat  . A fast heartbeat  . A bad rash all over body  . Dizziness and weakness   Immunizations Administered    Name Date Dose VIS Date Route   Pfizer COVID-19 Vaccine 03/04/2020  9:59 AM 0.3 mL 12/08/2019 Intramuscular   Manufacturer: [employer], [employer]   Lot: [id number]   NDC: [address]-3

## 2020-03-11 ENCOUNTER — Other Ambulatory Visit: Payer: Self-pay | Admitting: Adult Health

## 2020-03-11 ENCOUNTER — Other Ambulatory Visit: Payer: Self-pay | Admitting: Family Medicine

## 2020-03-11 DIAGNOSIS — E039 Hypothyroidism, unspecified: Secondary | ICD-10-CM

## 2020-03-11 DIAGNOSIS — R6889 Other general symptoms and signs: Secondary | ICD-10-CM

## 2020-03-11 DIAGNOSIS — I10 Essential (primary) hypertension: Secondary | ICD-10-CM

## 2020-03-14 ENCOUNTER — Other Ambulatory Visit: Payer: Self-pay | Admitting: Family Medicine

## 2020-03-14 DIAGNOSIS — I10 Essential (primary) hypertension: Secondary | ICD-10-CM

## 2020-03-14 MED ORDER — AMLODIPINE BESYLATE 10 MG PO TABS: 10.0000 mg | ORAL_TABLET | Freq: Every day | ORAL | 0 refills | Status: DC

## 2020-03-27 ENCOUNTER — Other Ambulatory Visit: Payer: Self-pay | Admitting: Family Medicine

## 2020-03-27 DIAGNOSIS — I10 Essential (primary) hypertension: Secondary | ICD-10-CM

## 2020-04-02 DIAGNOSIS — Z1231 Encounter for screening mammogram for malignant neoplasm of breast: Secondary | ICD-10-CM | POA: Diagnosis not present

## 2020-04-02 LAB — HM MAMMOGRAPHY

## 2020-04-18 ENCOUNTER — Other Ambulatory Visit: Payer: Self-pay | Admitting: Family Medicine

## 2020-04-18 DIAGNOSIS — E781 Pure hyperglyceridemia: Secondary | ICD-10-CM

## 2020-04-18 DIAGNOSIS — E782 Mixed hyperlipidemia: Secondary | ICD-10-CM

## 2020-05-07 DIAGNOSIS — K5730 Diverticulosis of large intestine without perforation or abscess without bleeding: Secondary | ICD-10-CM | POA: Diagnosis not present

## 2020-05-07 DIAGNOSIS — Z86010 Personal history of colonic polyps: Secondary | ICD-10-CM | POA: Diagnosis not present

## 2020-05-07 DIAGNOSIS — Z1211 Encounter for screening for malignant neoplasm of colon: Secondary | ICD-10-CM | POA: Diagnosis not present

## 2020-05-07 DIAGNOSIS — Z800 Family history of malignant neoplasm of digestive organs: Secondary | ICD-10-CM | POA: Diagnosis not present

## 2020-06-06 ENCOUNTER — Other Ambulatory Visit: Payer: Self-pay | Admitting: Physician Assistant

## 2020-06-06 ENCOUNTER — Other Ambulatory Visit: Payer: Self-pay | Admitting: Family Medicine

## 2020-06-06 DIAGNOSIS — R6889 Other general symptoms and signs: Secondary | ICD-10-CM

## 2020-06-06 DIAGNOSIS — E039 Hypothyroidism, unspecified: Secondary | ICD-10-CM

## 2020-06-06 DIAGNOSIS — I10 Essential (primary) hypertension: Secondary | ICD-10-CM

## 2020-06-11 ENCOUNTER — Telehealth: Payer: Self-pay | Admitting: Physician Assistant

## 2020-06-11 DIAGNOSIS — R6889 Other general symptoms and signs: Secondary | ICD-10-CM

## 2020-06-11 DIAGNOSIS — E039 Hypothyroidism, unspecified: Secondary | ICD-10-CM

## 2020-06-11 MED ORDER — LEVOTHYROXINE SODIUM 25 MCG PO TABS: 25.0000 ug | ORAL_TABLET | Freq: Every day | ORAL | 0 refills | Status: DC

## 2020-06-11 NOTE — Addendum Note (Signed)
Addended by: Sylvester Harder on: 06/11/2020 10:59 AM   Modules accepted: Orders

## 2020-06-11 NOTE — Telephone Encounter (Signed)
Medication sent to requested pharmacy. AS, CMA 

## 2020-06-11 NOTE — Telephone Encounter (Signed)
Patient is requesting a refill of her thyroid med, if approved please send to [facility]

## 2020-07-15 DIAGNOSIS — Z1211 Encounter for screening for malignant neoplasm of colon: Secondary | ICD-10-CM | POA: Diagnosis not present

## 2020-07-15 DIAGNOSIS — Z86010 Personal history of colonic polyps: Secondary | ICD-10-CM | POA: Diagnosis not present

## 2020-07-15 DIAGNOSIS — K5730 Diverticulosis of large intestine without perforation or abscess without bleeding: Secondary | ICD-10-CM | POA: Diagnosis not present

## 2020-07-15 DIAGNOSIS — Z800 Family history of malignant neoplasm of digestive organs: Secondary | ICD-10-CM | POA: Diagnosis not present

## 2020-07-15 LAB — HM COLONOSCOPY

## 2020-07-18 ENCOUNTER — Encounter: Payer: Self-pay | Admitting: Physician Assistant

## 2020-07-19 ENCOUNTER — Other Ambulatory Visit: Payer: Self-pay | Admitting: Family Medicine

## 2020-07-19 DIAGNOSIS — E782 Mixed hyperlipidemia: Secondary | ICD-10-CM

## 2020-07-19 DIAGNOSIS — E781 Pure hyperglyceridemia: Secondary | ICD-10-CM

## 2020-07-30 ENCOUNTER — Telehealth: Payer: Self-pay | Admitting: Physician Assistant

## 2020-07-30 DIAGNOSIS — E781 Pure hyperglyceridemia: Secondary | ICD-10-CM

## 2020-07-30 DIAGNOSIS — E782 Mixed hyperlipidemia: Secondary | ICD-10-CM

## 2020-07-30 MED ORDER — ATORVASTATIN CALCIUM 40 MG PO TABS: 40.0000 mg | ORAL_TABLET | Freq: Every day | ORAL | 0 refills | Status: DC

## 2020-07-30 NOTE — Telephone Encounter (Signed)
Patient is requesting a refill of her atorvastatin, if approved please send to [facility].

## 2020-07-30 NOTE — Telephone Encounter (Signed)
Refill sent to requested pharmacy. AS, CMA 

## 2020-07-30 NOTE — Addendum Note (Signed)
Addended by: Sylvester Harder on: 07/30/2020 01:54 PM   Modules accepted: Orders

## 2020-09-04 ENCOUNTER — Other Ambulatory Visit: Payer: Self-pay | Admitting: Physician Assistant

## 2020-09-04 DIAGNOSIS — I10 Essential (primary) hypertension: Secondary | ICD-10-CM

## 2020-09-10 ENCOUNTER — Other Ambulatory Visit: Payer: Self-pay | Admitting: Physician Assistant

## 2020-09-10 DIAGNOSIS — R6889 Other general symptoms and signs: Secondary | ICD-10-CM

## 2020-09-10 DIAGNOSIS — E039 Hypothyroidism, unspecified: Secondary | ICD-10-CM

## 2020-10-01 ENCOUNTER — Encounter: Payer: Self-pay | Admitting: Physician Assistant

## 2020-10-01 ENCOUNTER — Other Ambulatory Visit: Payer: Self-pay

## 2020-10-01 ENCOUNTER — Ambulatory Visit (INDEPENDENT_AMBULATORY_CARE_PROVIDER_SITE_OTHER): Payer: PPO | Admitting: Physician Assistant

## 2020-10-01 VITALS — BP 142/88 | HR 78 | Ht 61.5 in | Wt 152.0 lb

## 2020-10-01 DIAGNOSIS — I10 Essential (primary) hypertension: Secondary | ICD-10-CM

## 2020-10-01 DIAGNOSIS — E039 Hypothyroidism, unspecified: Secondary | ICD-10-CM | POA: Diagnosis not present

## 2020-10-01 DIAGNOSIS — E782 Mixed hyperlipidemia: Secondary | ICD-10-CM | POA: Diagnosis not present

## 2020-10-01 MED ORDER — AMLODIPINE BESYLATE 10 MG PO TABS: 10.0000 mg | ORAL_TABLET | Freq: Every day | ORAL | 0 refills | Status: DC

## 2020-10-01 NOTE — Progress Notes (Signed)
Telehealth office visit note for Andrea Masker, PA-C- at Primary Care at [facility]   I connected with current patient today by telephone and verified that I am speaking with the correct person   . Location of the patient: Home . Location of the provider: Office - This visit type was conducted due to national recommendations for restrictions regarding the COVID-19 Pandemic (e.g. social distancing) in an effort to limit this patient's exposure and mitigate transmission in our community.    - No physical exam could be performed with this format, beyond that communicated to [place] by the patient/ family members as noted.   - Additionally my office staff/ schedulers were to discuss with the patient that there may be a monetary charge related to this service, depending on their medical insurance.  My understanding is that patient understood and consented to proceed.     _________________________________________________________________________________   History of Present Illness: Pt calls in to follow up on hypertension, hyperlipidemia and hypothyroidism. Pt reports she is in the process of moving back to [place] where she is originally from and will be establishing with a new PCP. Has no acute concerns.  HTN: Pt denies chest pain, palpitations, dizziness or lower extremity swelling. Taking medication as directed without side effects. Checks BP at home  and readings average 130s/80s. Reports last few weeks have been stressful due to packing and her blood pressure has been a little higher than normal. Requesting a 30 day refill of amlodipine.  HLD: Pt taking medication as directed without issues. Denies side effects.  Hypothyroid: Asymptomatic. Reports has been on the same dose for a long period of time.      No flowsheet data found.  Depression screen [facility] 2/9 10/01/2020 12/01/2019 09/20/2018 06/14/2018 02/14/2018  Decreased Interest 0 0 0 0 0  Down, Depressed, Hopeless 0 0 0 0 0  PHQ - 2  Score 0 0 0 0 0  Altered sleeping 0 0 0 1 1  Tired, decreased energy 0 0 0 0 1  Change in appetite 0 0 0 0 0  Feeling bad or failure about yourself  0 0 0 0 0  Trouble concentrating 0 0 0 0 0  Moving slowly or fidgety/restless 0 0 0 0 0  Suicidal thoughts 0 0 0 0 0  PHQ-9 Score 0 0 0 1 2  Difficult doing work/chores - - Not difficult at all Not difficult at all Not difficult at all      Impression and Recommendations:     1. Essential hypertension   2. Mixed hyperlipidemia   3. Hypothyroidism, unspecified type     Essential hypertension: -BP mildly elevated -Continue current medication regimen. Provided requested refill.  -Follow a low sodium diet.  Mixed hyperlipidemia: -Last lipid panel wnl with exception of elevated triglycerides. -Continue current medication regimen. -Follow a heart healthy diet and monitor simple carbohydrates.  Hypothyroidism: -Stable -Continue current medication regimen.   - As part of my medical decision making, I reviewed the following data within the electronic MEDICAL RECORD NUMBER History obtained from pt /family, CMA notes reviewed and incorporated if applicable, Labs reviewed, Radiograph/ tests reviewed if applicable and OV notes from prior OV's with me, as well as any other specialists she/he has seen since seeing me last, were all reviewed and used in my medical decision making process today.    - Additionally, when appropriate, discussion had with patient regarding our treatment plan, and their biases/concerns about that plan were  used in my medical decision making today.    - The patient agreed with the plan and demonstrated an understanding of the instructions.   No barriers to understanding were identified.     - The patient was advised to call back or seek an in-person evaluation if the symptoms worsen or if the condition fails to improve as anticipated.   Return for Patient is moving out of state and transferring care.    No orders of  the defined types were placed in this encounter.   Meds ordered this encounter  Medications  . amLODipine (NORVASC) 10 MG tablet    Sig: Take 1 tablet (10 mg total) by mouth daily.    Dispense:  30 tablet    Refill:  0    Order Specific Question:   Supervising Provider    Answer:   Nani Gasser D [[id number]]    Medications Discontinued During This Encounter  Medication Reason  . amLODipine (NORVASC) 10 MG tablet Reorder       Time spent on visit including pre-visit chart review and post-visit care was 8 minutes.      The 21st Century Cures Act was signed into law in 2016 which includes the topic of electronic health records.  This provides immediate access to information in MyChart.  This includes consultation notes, operative notes, office notes, lab results and pathology reports.  If you have any questions about what you read please let us know at your next visit or call us at the office.  We are right here with you.   __________________________________________________________________________________     Patient Care Team    Relationship Specialty Notifications Start End  Andrea Floyd, [place] PCP - General   04/28/20   Mammography, [facility]  Diagnostic Radiology  01/03/20      -Vitals obtained; medications/ allergies reconciled;  personal medical, social, Sx etc.histories were updated by CMA, reviewed by me and are reflected in chart   Patient Active Problem List   Diagnosis Date Noted  . Noncompliance 09/11/2019  . Osteopenia 09/11/2019  . Obesity, Class I, BMI 30-34.9 09/20/2018  . History of fracture of R ankle- distal fibular fracture due to fall 09/20/2018  . Estrogen deficiency 09/20/2018  . BMI 30.0-30.9,adult 06/14/2018  . Essential hypertension 06/14/2018  . Family history of diabetes mellitus in father- in 64's 02/14/2018  . Prediabetes 02/14/2018  . Hypothyroidism 02/14/2018  . Mixed hyperlipidemia 07/05/2017  . Hypertriglyceridemia 07/05/2017  .  Medication monitoring encounter 07/05/2017  . Inactivity 07/05/2017  . Tobacco use disorder 07/03/2016  . History of hypothyroidism 07/03/2016  . 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 Vitamin D insufficiency 07/03/2016  . Vitamin B insufficiency 07/03/2016  . Overweight (BMI 25.0-29.9) 07/03/2016  . History of TAH 07/03/2016  . Hypertension 08/24/2013  . SUI (stress urinary incontinence, female) 08/20/2011     Current Meds  Medication Sig  . aspirin 81 MG tablet Take 81 mg by mouth daily.  Marland Kitchen atorvastatin (LIPITOR) 40 MG tablet Take 1 tablet (40 mg total) by mouth at bedtime.  . Calcium Carbonate-Vitamin D 600-400 MG-UNIT tablet Take 1 tablet by mouth 2 (two) times daily.  . Cholecalciferol (VITAMIN D3) 125 MCG (5000 UT) TABS Pt takes 4,000 IU OTC vitamin D3 daily.  . Glucosamine 500 MG CAPS Take 2 each by mouth daily.   Marland Kitchen levothyroxine (SYNTHROID) 25 MCG tablet TAKE 1 TABLET (25 MCG TOTAL) BY MOUTH DAILY BEFORE BREAKFAST.  . [DISCONTINUED] amLODipine (NORVASC) 10  MG tablet Take 1 tablet (10 mg total) by mouth daily. **PATIENT NEEDS APT FOR FURTHER REFILLS**  . amLODipine (NORVASC) 10 MG tablet Take 1 tablet (10 mg total) by mouth daily.     Allergies:  Allergies  Allergen Reactions  . Codeine Shortness Of Breath  . Demerol Nausea And Vomiting     ROS:  See above HPI for pertinent positives and negatives   Objective:   Blood pressure (!) 142/88, pulse 78, height 5' 1.5" (1.562 m), weight 152 lb (68.9 kg).  (if some vitals are omitted, this means that patient was UNABLE to obtain them even though they were asked to get them prior to OV today.  They were asked to call us at their earliest convenience with these once obtained. ) General: A & O * 3; sounds in no acute distress; in usual state of health.  Respiratory: speaking in full sentences, no conversational dyspnea Psych: insight appears good, mood- appears full

## 2020-10-02 ENCOUNTER — Ambulatory Visit: Payer: PPO | Admitting: Physician Assistant

## 2020-10-25 ENCOUNTER — Ambulatory Visit: Payer: PPO | Admitting: Physician Assistant

## 2020-12-11 ENCOUNTER — Encounter: Payer: Self-pay | Admitting: Physician Assistant

## 2020-12-11 DIAGNOSIS — I10 Essential (primary) hypertension: Secondary | ICD-10-CM

## 2020-12-11 MED ORDER — AMLODIPINE BESYLATE 10 MG PO TABS: 10.0000 mg | ORAL_TABLET | Freq: Every day | ORAL | 0 refills | Status: DC

## 2020-12-30 ENCOUNTER — Telehealth: Payer: Self-pay | Admitting: Physician Assistant

## 2020-12-30 DIAGNOSIS — E781 Pure hyperglyceridemia: Secondary | ICD-10-CM

## 2020-12-30 DIAGNOSIS — E039 Hypothyroidism, unspecified: Secondary | ICD-10-CM

## 2020-12-30 DIAGNOSIS — R6889 Other general symptoms and signs: Secondary | ICD-10-CM

## 2020-12-30 DIAGNOSIS — E782 Mixed hyperlipidemia: Secondary | ICD-10-CM

## 2020-12-30 MED ORDER — LEVOTHYROXINE SODIUM 25 MCG PO TABS: 25.0000 ug | ORAL_TABLET | Freq: Every day | ORAL | 0 refills | Status: DC

## 2020-12-30 MED ORDER — ATORVASTATIN CALCIUM 40 MG PO TABS: 40.0000 mg | ORAL_TABLET | Freq: Every day | ORAL | 0 refills | Status: DC

## 2020-12-30 NOTE — Telephone Encounter (Signed)
These med refills have been sent to patient pharmacy in [place]. AS, CMA

## 2020-12-30 NOTE — Telephone Encounter (Signed)
-----   Message from Wynonia Hazard, [facility] sent at 12/26/2020  2:43 PM EST ----- Regarding: Refills for Ms. 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  [name] came up on our medication adherence list for quality measures as she has not refilled atorvastatin since August.  I just spoke with her about it and she would love to have a refill of her statin and levothyroxine sent to her new pharmacy on file in [place] until she establishes care with a PCP.  Thanks so much!  Haynes Hoehn, PharmD, [facility] Clinical Pharmacist [employer] [phone]

## 2021-01-27 NOTE — Telephone Encounter (Signed)
 Pt name and DOB verified.    Who is your current or most recent primary care provider and   which hospital were they affiliated with? [facility] in [place] [place]    How long did you see them for? 2016    Is there a reason that you are looking for a new provider? Moved    Would you like to provide your e-mail to sign up for out My Chart system? NA  The benefits of being a My Chart member are:    -Request medical appointments  -View your health summary from the My Chart electronic health record.  -View test results.  -Request prescription renewals.  -Access trusted health information resources.  -Communicate electronically and securely with your medical care team.    Would you like a test message or letter with your activation code? NA    Do you have a preference about the provider you will see? No    When was your last Annual or Well Child Exam? September 21    Do you have any ongoing or chronic conditions you are currently being treated   for (e.g. Hypertension, Diabetes, Depression, etc)? If yes list: High Blood Pressure, Hypothyroid    Have you been seen by a provider for these chronic conditions in the last 6 months? If so, who? No    Do you need an appointment to establish care or is there something more   urgent you need to be seen for? Explain: Establish      Are you on any medicines that require a special prescription such as a sleeping pills, pain medication or stimulants? (if no go ahead and book appointment, if yes please read the following: No    Please be aware that our provider's may not prescribe these medications at the first visit as they need to get to know more about you and your medical problems/history before they will safely prescribe these Inforemd? Yes or No NA    Do you have any questions about what we discussed? NA  (If no go ahead and book, if yes please get the details and send to the practice)    Your new patient appointment is booked on       Future Appointments   Date Time Provider  Department Center   01/30/2021  9:00 AM Barbara Elliott BRAVO, FNP [facility]         **Thank you for choosing us  for your health care needs.  Ms. Barbara Elliott I do want to let you know that you will need to contact your insurance company and make them aware of your new Primary Care Provider which is No primary care provider on file..   Also Ms. Barbara Elliott a new patient packet will be coming to you. Would you like to have this mailed or faxed to you? Mail If faxed what is the number? Mail  We would greatly appreciate it if you could complete the release of records and either mail back or drop off at out office within 7 days of receipt.    Notes:     CB#: [id number] (home)   Barbara Elliott Benedict

## 2021-01-29 NOTE — Telephone Encounter (Signed)
NP letter and appt reminder mailed.      Thank you,  Beverlee Nims

## 2021-01-30 ENCOUNTER — Ambulatory Visit: Attending: Family | Primary: Family

## 2021-01-30 ENCOUNTER — Ambulatory Visit: Admit: 2021-01-30 | Discharge: 2021-01-30 | Payer: PRIVATE HEALTH INSURANCE | Attending: Family | Primary: Family

## 2021-01-30 DIAGNOSIS — Z7689 Persons encountering health services in other specified circumstances: Secondary | ICD-10-CM

## 2021-01-30 NOTE — Assessment & Plan Note (Signed)
??   I recommended eating a healthy diet, low-sodium intake (<1.5gm/day), and regular aerobic exercise (150 minutes or more per week).  ?? Encouraged home blood pressure monitoring, and follow up if systolic blood pressure is over 140 or diastolic pressure is over 90 consistently.  ?? Blood pressure is elevated - new patient- monitor at home for 2 weeks and recheck in office and discuss values in office to determine necessary change.

## 2021-01-30 NOTE — Assessment & Plan Note (Signed)
Discussed heat and ice and stretching- Discussed mobility and risk of falls.  Discussed imaging- not at this time. PT could help strengthen muscles, mobility and ROM. Will start there. Discussed use of medication for pain management. Monitor for over use of NSAIDs.

## 2021-01-30 NOTE — Assessment & Plan Note (Signed)
Labs ordered- currently on synthroid 25 mcg daily

## 2021-01-30 NOTE — Assessment & Plan Note (Signed)
??   Discussed smoking cessation with her and the potential risks of continued smoking including: increased risk of cancer, heart disease, stroke, and poor healing.   ?? There are several methods to help her quit including: nicotine patches, nicotine gum, nicotine inhaler, Wellbutrin or Chantix.  ?? She is interested in attempting to quit at this time.    She was counseled on the dangers of tobacco use, and was advised to quit and reluctant to quit.  Reviewed strategies to maximize success, including removing cigarettes and smoking materials from environment.

## 2021-01-30 NOTE — Assessment & Plan Note (Signed)
Labs ordered -patient currently on atorvastatin 40 mg daily.

## 2021-01-30 NOTE — Progress Notes (Signed)
[facility]   [address]  [place] [address]  [phone]    CHIEF COMPLAINT     Barbara Elliott is a 70 y.o. female who presents for an acute visit due to New Patient.    HPI     Patient is here to establish care from [facility].  She was born and raised in [place] and lived in [place] for 25 years and has her family moving here. She bought a house and in the process of getting updating. She said they are struggling to find individuals to help fix up the house. She said they are stable. Heat and hot water are good.      She states she does not normally monitor her blood pressure recently since her move, however she said she used to.      HYPERTENSION:    She reports good medication compliance and no side effects.   Home blood pressure monitoring is not being performed.  Chest pain:  no  Dizziness:  no  New or worsening leg edema  no    BP Readings from Last 3 Encounters:   01/30/21 (!) 146/80     No results found for: K, CREA, CREATEXT   Key CAD CHF Meds             amLODIPine (NORVASC) 10 mg tablet (Taking) Take 1 Tablet by mouth daily.    atorvastatin (LIPITOR) 40 mg tablet (Taking) Take 1 Tablet by mouth daily.        Patient states she has pain in her hip, and anterior aspect of her thigh. It has been worse in the couple days with climbing a ladder has aggravated it.    No recent falls or injurys, no swelling, currently active and moving around. She states her gait and balance has changed and her balance is not what it should be.  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    Reviewed on today's visit patient's past medical/surgical history, family/social history, medications allergies, previous lab work and imaging studies immunizations and preventive care    PHYSICAL EXAM     Vitals signs reviewed.   Constitutional:       General: Patient is not in any acute distress.     Appearance: Patient is well-developed and well nourished.  HENT:      Head:  Normocephalic.   Cardiovascular:      Rate and Rhythm: Normal rate and regular rhythm.      Heart sounds: Normal heart sounds. No murmur. No friction rub.      No edema present to lower extremities.  Pulmonary:      Effort: Pulmonary effort is normal. No respiratory distress      Breath sounds: Normal breath sounds throughout. No wheezing or rales.     Hip Exam:   ?? Range of motion is normal in internal and external rotation and no tenderness in the hip joint with motion testing.  ?? There is some palpatory tenderness over the greater trochanter or iliotibial band.  ?? No gluteal muscle tenderness to palpation.  ?? Gait: antalgic    Neurological:      Mental Status: Patient is oriented to person, place, and time.      Cranial Nerves: No cranial nerve deficit  Psychiatric:         Mood and Affect:    Mood normal     Behavior: Behavior normal.  Thought Content: Thought content normal.         Judgment: Judgment normal.       MEDICATIONS     Current Outpatient Medications   Medication Sig   ??? amLODIPine (NORVASC) 10 mg tablet Take 1 Tablet by mouth daily.   ??? atorvastatin (LIPITOR) 40 mg tablet Take 1 Tablet by mouth daily.   ??? levothyroxine (SYNTHROID) 25 mcg tablet Take 25 mcg by mouth Daily (before breakfast).   ??? multivitamin (ONE A DAY) tablet Take 1 Tablet by mouth daily.   ??? cholecalciferol, vitamin D3, (Vitamin D3) 50 mcg (2,000 unit) tab Take 1 Tablet by mouth daily.   ??? calcium carbonate (CALTREX) 600 mg calcium (1,500 mg) tablet Take 600 mg by mouth two (2) times a day.     No current facility-administered medications for this visit.     There are no discontinued medications.    ALLERGIES     Allergies   Allergen Reactions   ??? Codeine Shortness of Breath   ??? Lisinopril Cough       ACTIVE MEDICAL PROBLEMS     Patient Active Problem List   Diagnosis Code   ??? Encounter to establish care Z76.89   ??? Encounter for hepatitis C screening test for low risk patient Z11.59   ??? Preventative health care Z00.00   ???  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 Hypertension I10   ??? Prediabetes R73.03   ??? Current every day smoker F17.200   ??? 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 Married to [employer].    4 children together:    - Sean 1970     -Adam 1972 Grand child- 1 boy    -Lyla Son 1975 Grand child -1 girl    -[name] [phone] - 3 girls        She does currently smoke cigarettes - 1/2 a pack a week, not every day.    Her husband does smoke cigars.    She does not use illicit drugs.    She does drink wine 1-2 glasses at night.        She enjoys her time with grand children, reading.        Wears a seat belt in a car.             VITALS     Vitals:    01/30/21 0908   BP: (!) 146/80   Pulse: 79   SpO2: 98%   Weight: 148 lb (67.1 kg)   Height: 5' 1.5" (1.562 m)     Body mass index is 27.51 kg/m??.      LABS & IMAGING   No results found for this or any previous visit (from the past 24 hour(s)).    ASSESSMENT AND PLAN     Diagnoses and all orders for this visit:    1. Encounter to establish care    2. Preventative health care  -     CBC WITH AUTOMATED DIFF; Future  -     METABOLIC PANEL, COMPREHENSIVE; Future  -     VITAMIN D, 25 HYDROXY; Future    3. Left hip pain  Assessment & Plan:  Discussed heat and ice and stretching- Discussed mobility and risk of falls.  Discussed imaging- not at this time. PT could help strengthen  muscles, mobility and ROM. Will start there. Discussed use of medication for pain management. Monitor for over use of NSAIDs.    Orders:  -     REFERRAL TO PHYSICAL THERAPY    4. Acquired hypothyroidism  Assessment & Plan:  Labs ordered- currently on synthroid 25 mcg daily    Orders:  -     TSH CASCADE; Future    5. Mixed hyperlipidemia  Assessment & Plan:  Labs ordered -patient currently on atorvastatin 40 mg daily.    Orders:  -     LIPID PANEL W/ REFLX DIRECT LDL; Future    6. Primary  hypertension  Assessment & Plan:  ?? I recommended eating a healthy diet, low-sodium intake (<1.5gm/day), and regular aerobic exercise (150 minutes or more per week).  ?? Encouraged home blood pressure monitoring, and follow up if systolic blood pressure is over 140 or diastolic pressure is over 90 consistently.  ?? Blood pressure is elevated - new patient- monitor at home for 2 weeks and recheck in office and discuss values in office to determine necessary change.        7. Prediabetes  Assessment & Plan:  ?? We discussed that pre-diabetes is a precursor to Diabetes and it's very important to maintain a healthy weight in order to prevent it's progression.  ?? I recommended that she continue to work on eating a diet low in concentrated sweets, avoid soda, and encouraged at least 150 minutes per week of regular aerobic exercise.  ?? A1c was reviewed with the patient and there is no need for medication at this time.  ?? Repeat Glycohemoglobin and follow-up in 12 months.  ?? Pending fasting glucose to move forward and obtain a hemoglobin A1C if necessary      Orders:  -     HEMOGLOBIN A1C WITH EAG; Future    8. Current every day smoker  Assessment & Plan:  ?? Discussed smoking cessation with her and the potential risks of continued smoking including: increased risk of cancer, heart disease, stroke, and poor healing.   ?? There are several methods to help her quit including: nicotine patches, nicotine gum, nicotine inhaler, Wellbutrin or Chantix.  ?? She is interested in attempting to quit at this time.    She was counseled on the dangers of tobacco use, and was advised to quit and reluctant to quit.  Reviewed strategies to maximize success, including removing cigarettes and smoking materials from environment.        9. Encounter for hepatitis C screening test for low risk patient  -     HCV AB; Future    Follow-up and Dispositions    ?? Return in about 2 months (around 03/30/2021) for annual wellness exam.           Future  Appointments   Date Time Provider Department Center   03/25/2021  9:00 AM AMA NURSE AMA [facility]   04/01/2021  9:00 AM Mylinda Latina, FNP AMA [facility]       Jameah Rouser Richardean Sale, FNP  01/30/2021

## 2021-01-30 NOTE — Addendum Note (Signed)
Addendum Note by Augustina Mood, MA at 01/30/21 0900                Author: Augustina Mood, MA  Service: --  Author Type: Medical Assistant       Filed: 03/25/21 0851  Encounter Date: 01/30/2021  Status: Signed          Editor: Dillingham, Harriett Rush, MA (Medical Assistant)          Addended by: Augustina Mood on: 03/25/2021 08:51 AM    Modules accepted: Orders

## 2021-01-30 NOTE — Assessment & Plan Note (Signed)
??   We discussed that pre-diabetes is a precursor to Diabetes and it's very important to maintain a healthy weight in order to prevent it's progression.  ?? I recommended that she continue to work on eating a diet low in concentrated sweets, avoid soda, and encouraged at least 150 minutes per week of regular aerobic exercise.  ?? A1c was reviewed with the patient and there is no need for medication at this time.  ?? Repeat Glycohemoglobin and follow-up in 12 months.  ?? Pending fasting glucose to move forward and obtain a hemoglobin A1C if necessary

## 2021-02-05 ENCOUNTER — Inpatient Hospital Stay: Admit: 2021-02-05 | Payer: PRIVATE HEALTH INSURANCE | Primary: Family

## 2021-02-05 DIAGNOSIS — M25552 Pain in left hip: Secondary | ICD-10-CM

## 2021-02-05 NOTE — Progress Notes (Signed)
on uneven ground: Extreme difficulty or unable to perform activity   Making sharp turns while running fast: Extreme difficulty or unable to perform activity   Hopping: Extreme difficulty or unable to perform activity   Rolling over in bed: A little bit of difficulty      Initial Score: Total Score: 39 points/80     Most Recent Score: Total Score: 39 points/80 (Date: 02/05/2021)      Interpretation of Score: 20 questions each scored on a 5-point scale with 0 representing "extreme difficulty or unable to perform" and 4 representing "no difficulty".  The lower the score,  the greater the functional disability. 80/80 represents no disability.  Minimal detectable change is 9 points.               Score  80  79-63  62-48  47-32  31-16  15-1  0              Modifier  CH  CI  CJ  CK  CL  CM  CN                   Treatment Included on Day of Evaluation:     Home Exercise Program:  [x]   Issued  [x]  Verbal   [x]  Written Handout [x]   Return Demonstration      Therapeutic Exercise:   Figure 4  3 x 20" L, hip flexor st 3 x 20", piriformis st 3 x 20" L, Knee to chest 3 x 20"             Therapist Signature:     Athena Masse, PT, 02/05/2021  Start Time: 9:10 AM     End Time: 1000                                               Evaluation Minutes:  30   Treatment Minutes: 20   Untimed Minutes:   Total Time: 50       Treatment Time Breakdown:  Modalities :   MT:     TE: 20    TA:   GT:    NMRE                 Form meets HCFA 700 requirements          Provider Number [id number]   Physical Therapy Evaluation Summary and Plan of Care      Patient: Barbara Elliott (70 y.o. female)     DOB:  Aug 20, 1951   Start of Care:  02/05/2021     Referring Physician: Mylinda Latina, FNP   PCP:  Mylinda Latina, FNP      Diagnosis: Left hip pain [M25.552]  Insurance: Payor: [employer] / Plan: ME [facility] ACCESS / Product Type: Medicare  Advantage /     Subscriber # or HICN: [id number]     Treatment Diagnosis:  Impaired ROM, weakness, limited gait   Visits from [facility]:  1     Onset date:   Certification Period: From 02/05/2021 to         Prior Hospitalization:  Rehab Potential: good             Impression: Based on todays overall findings  Barbara Elliott (70 y.o. female) presents with pain affecting function, decrease ROM, decrease strength, impaired gait/ balance, decrease ADL/ functional  Progress  Notes by Athena Masse, PT at 02/05/21 1884                Author: Athena Masse, PT  Service: Physical Therapy  Author Type: Physical Therapist       Filed: 02/05/21 1152  Date of Service: 02/05/21 0907  Status: Addendum          Editor: Athena Masse, PT (Physical Therapist)       Related Notes: Original Note by Athena Masse, PT (Physical Therapist) filed at 02/05/21 1039          Cosigner: Mylinda Latina, FNP at 02/06/21 0818                    Provider Number [id number]   Physical Therapy Initial Evaluation      Patient: Barbara Elliott (70 y.o. female)     DOB:  Dec 28, 1951   Exam Date: 02/05/2021   Start Time: 9:07 AM        Referring Physician: Mylinda Latina, FNP   PCP:  Mylinda Latina, FNP      Diagnosis: Left hip pain [M25.552]   Treatment Dx: impaired mobility, joint pain, limited flexibility   Insurance: Payor: [employer] / Plan: [place] [facility] ACCESS / Product Type: Medicare  Advantage /     Subscriber ID #: [id number]   Secondary insurance:         History of Present Illness/injury: History obtained from chart review and the patient.   Time of onset: 8 month(s) Onset Course: acute and gradual        [name] (71 y.o. female)  presenting today with ? L hip pain that comes/goes. Started back in August with moving process.  Did improve slightly but then didn't improve anymore and has aggravation with some activities like walking progressive distance.  When first getting up it's very painful. Standing prolonged is also aggravating.  Sometimes she takes Tylenol. No sensory changes.  Does feels weaker "no leverage".  Weightbearing/movement activities are what aggravate. Very stiff with donning shoes/socks  L. 3 years ago fracture her R tibia and had to compensate with L during the healing.  Recently moved from [place] to [place].  Now has 13 steps to second level leading with R leg due to the L hip.Was on a step ladder balancing for painting.       Previous PT/Other Services: for R LE  after fracture     Condition Since Onset:   []   Improving       [x]   Worsening      [x]  Fluctuating      []    Unchanged       Pertinent Medical Hx: (include comorbidities, diagnostic tests and meds)      has no past medical history on file.   Diagnostic Tests:  No results found.      Surgical Hx:  has a past surgical history that includes hx lumbar diskectomy (1998); ir cholecystostomy percutaneous (1980);  hx hysterectomy (2012); and hx other surgical.    Medication:     Current Outpatient Medications:    ?  amLODIPine (NORVASC) 10 mg tablet, Take 1 Tablet by mouth daily., Disp: , Rfl:    ?  atorvastatin (LIPITOR) 40 mg tablet, Take 1 Tablet by mouth daily., Disp: , Rfl:    ?  levothyroxine (SYNTHROID) 25 mcg tablet, Take 25 mcg by mouth Daily (before breakfast)., Disp: , Rfl:    ?  on uneven ground: Extreme difficulty or unable to perform activity   Making sharp turns while running fast: Extreme difficulty or unable to perform activity   Hopping: Extreme difficulty or unable to perform activity   Rolling over in bed: A little bit of difficulty      Initial Score: Total Score: 39 points/80     Most Recent Score: Total Score: 39 points/80 (Date: 02/05/2021)      Interpretation of Score: 20 questions each scored on a 5-point scale with 0 representing "extreme difficulty or unable to perform" and 4 representing "no difficulty".  The lower the score,  the greater the functional disability. 80/80 represents no disability.  Minimal detectable change is 9 points.               Score  80  79-63  62-48  47-32  31-16  15-1  0              Modifier  CH  CI  CJ  CK  CL  CM  CN                   Treatment Included on Day of Evaluation:     Home Exercise Program:  [x]   Issued  [x]  Verbal   [x]  Written Handout [x]   Return Demonstration      Therapeutic Exercise:   Figure 4  3 x 20" L, hip flexor st 3 x 20", piriformis st 3 x 20" L, Knee to chest 3 x 20"             Therapist Signature:     Athena Masse, PT, 02/05/2021  Start Time: 9:10 AM     End Time: 1000                                               Evaluation Minutes:  30   Treatment Minutes: 20   Untimed Minutes:   Total Time: 50       Treatment Time Breakdown:  Modalities :   MT:     TE: 20    TA:   GT:    NMRE                 Form meets HCFA 700 requirements          Provider Number [id number]   Physical Therapy Evaluation Summary and Plan of Care      Patient: Barbara Elliott (70 y.o. female)     DOB:  Aug 20, 1951   Start of Care:  02/05/2021     Referring Physician: Mylinda Latina, FNP   PCP:  Mylinda Latina, FNP      Diagnosis: Left hip pain [M25.552]  Insurance: Payor: [employer] / Plan: [place] [facility] ACCESS / Product Type: Medicare  Advantage /     Subscriber # or HICN: [id number]     Treatment Diagnosis:  Impaired ROM, weakness, limited gait   Visits from [facility]:  1     Onset date:   Certification Period: From 02/05/2021 to         Prior Hospitalization:  Rehab Potential: good             Impression: Based on todays overall findings  Barbara Elliott (70 y.o. female) presents with pain affecting function, decrease ROM, decrease strength, impaired gait/ balance, decrease ADL/ functional  on uneven ground: Extreme difficulty or unable to perform activity   Making sharp turns while running fast: Extreme difficulty or unable to perform activity   Hopping: Extreme difficulty or unable to perform activity   Rolling over in bed: A little bit of difficulty      Initial Score: Total Score: 39 points/80     Most Recent Score: Total Score: 39 points/80 (Date: 02/05/2021)      Interpretation of Score: 20 questions each scored on a 5-point scale with 0 representing "extreme difficulty or unable to perform" and 4 representing "no difficulty".  The lower the score,  the greater the functional disability. 80/80 represents no disability.  Minimal detectable change is 9 points.               Score  80  79-63  62-48  47-32  31-16  15-1  0              Modifier  CH  CI  CJ  CK  CL  CM  CN                   Treatment Included on Day of Evaluation:     Home Exercise Program:  [x]   Issued  [x]  Verbal   [x]  Written Handout [x]   Return Demonstration      Therapeutic Exercise:   Figure 4  3 x 20" L, hip flexor st 3 x 20", piriformis st 3 x 20" L, Knee to chest 3 x 20"             Therapist Signature:     Athena Masse, PT, 02/05/2021  Start Time: 9:10 AM     End Time: 1000                                               Evaluation Minutes:  30   Treatment Minutes: 20   Untimed Minutes:   Total Time: 50       Treatment Time Breakdown:  Modalities :   MT:     TE: 20    TA:   GT:    NMRE                 Form meets HCFA 700 requirements          Provider Number [id number]   Physical Therapy Evaluation Summary and Plan of Care      Patient: Barbara Elliott (70 y.o. female)     DOB:  Aug 20, 1951   Start of Care:  02/05/2021     Referring Physician: Mylinda Latina, FNP   PCP:  Mylinda Latina, FNP      Diagnosis: Left hip pain [M25.552]  Insurance: Payor: [employer] / Plan: [place] [facility] ACCESS / Product Type: Medicare  Advantage /     Subscriber # or HICN: [id number]     Treatment Diagnosis:  Impaired ROM, weakness, limited gait   Visits from [facility]:  1     Onset date:   Certification Period: From 02/05/2021 to         Prior Hospitalization:  Rehab Potential: good             Impression: Based on todays overall findings  Barbara Elliott (70 y.o. female) presents with pain affecting function, decrease ROM, decrease strength, impaired gait/ balance, decrease ADL/ functional  on uneven ground: Extreme difficulty or unable to perform activity   Making sharp turns while running fast: Extreme difficulty or unable to perform activity   Hopping: Extreme difficulty or unable to perform activity   Rolling over in bed: A little bit of difficulty      Initial Score: Total Score: 39 points/80     Most Recent Score: Total Score: 39 points/80 (Date: 02/05/2021)      Interpretation of Score: 20 questions each scored on a 5-point scale with 0 representing "extreme difficulty or unable to perform" and 4 representing "no difficulty".  The lower the score,  the greater the functional disability. 80/80 represents no disability.  Minimal detectable change is 9 points.               Score  80  79-63  62-48  47-32  31-16  15-1  0              Modifier  CH  CI  CJ  CK  CL  CM  CN                   Treatment Included on Day of Evaluation:     Home Exercise Program:  [x]   Issued  [x]  Verbal   [x]  Written Handout [x]   Return Demonstration      Therapeutic Exercise:   Figure 4  3 x 20" L, hip flexor st 3 x 20", piriformis st 3 x 20" L, Knee to chest 3 x 20"             Therapist Signature:     Athena Masse, PT, 02/05/2021  Start Time: 9:10 AM     End Time: 1000                                               Evaluation Minutes:  30   Treatment Minutes: 20   Untimed Minutes:   Total Time: 50       Treatment Time Breakdown:  Modalities :   MT:     TE: 20    TA:   GT:    NMRE                 Form meets HCFA 700 requirements          Provider Number [id number]   Physical Therapy Evaluation Summary and Plan of Care      Patient: Barbara Elliott (70 y.o. female)     DOB:  Aug 20, 1951   Start of Care:  02/05/2021     Referring Physician: Mylinda Latina, FNP   PCP:  Mylinda Latina, FNP      Diagnosis: Left hip pain [M25.552]  Insurance: Payor: [employer] / Plan: [place] [facility] ACCESS / Product Type: Medicare  Advantage /     Subscriber # or HICN: [id number]     Treatment Diagnosis:  Impaired ROM, weakness, limited gait   Visits from [facility]:  1     Onset date:   Certification Period: From 02/05/2021 to         Prior Hospitalization:  Rehab Potential: good             Impression: Based on todays overall findings  Barbara Elliott (70 y.o. female) presents with pain affecting function, decrease ROM, decrease strength, impaired gait/ balance, decrease ADL/ functional  on uneven ground: Extreme difficulty or unable to perform activity   Making sharp turns while running fast: Extreme difficulty or unable to perform activity   Hopping: Extreme difficulty or unable to perform activity   Rolling over in bed: A little bit of difficulty      Initial Score: Total Score: 39 points/80     Most Recent Score: Total Score: 39 points/80 (Date: 02/05/2021)      Interpretation of Score: 20 questions each scored on a 5-point scale with 0 representing "extreme difficulty or unable to perform" and 4 representing "no difficulty".  The lower the score,  the greater the functional disability. 80/80 represents no disability.  Minimal detectable change is 9 points.               Score  80  79-63  62-48  47-32  31-16  15-1  0              Modifier  CH  CI  CJ  CK  CL  CM  CN                   Treatment Included on Day of Evaluation:     Home Exercise Program:  [x]   Issued  [x]  Verbal   [x]  Written Handout [x]   Return Demonstration      Therapeutic Exercise:   Figure 4  3 x 20" L, hip flexor st 3 x 20", piriformis st 3 x 20" L, Knee to chest 3 x 20"             Therapist Signature:     Athena Masse, PT, 02/05/2021  Start Time: 9:10 AM     End Time: 1000                                               Evaluation Minutes:  30   Treatment Minutes: 20   Untimed Minutes:   Total Time: 50       Treatment Time Breakdown:  Modalities :   MT:     TE: 20    TA:   GT:    NMRE                 Form meets HCFA 700 requirements          Provider Number [id number]   Physical Therapy Evaluation Summary and Plan of Care      Patient: Barbara Elliott (70 y.o. female)     DOB:  Aug 20, 1951   Start of Care:  02/05/2021     Referring Physician: Mylinda Latina, FNP   PCP:  Mylinda Latina, FNP      Diagnosis: Left hip pain [M25.552]  Insurance: Payor: [employer] / Plan: [place] [facility] ACCESS / Product Type: Medicare  Advantage /     Subscriber # or HICN: [id number]     Treatment Diagnosis:  Impaired ROM, weakness, limited gait   Visits from [facility]:  1     Onset date:   Certification Period: From 02/05/2021 to         Prior Hospitalization:  Rehab Potential: good             Impression: Based on todays overall findings  Barbara Elliott (70 y.o. female) presents with pain affecting function, decrease ROM, decrease strength, impaired gait/ balance, decrease ADL/ functional  on uneven ground: Extreme difficulty or unable to perform activity   Making sharp turns while running fast: Extreme difficulty or unable to perform activity   Hopping: Extreme difficulty or unable to perform activity   Rolling over in bed: A little bit of difficulty      Initial Score: Total Score: 39 points/80     Most Recent Score: Total Score: 39 points/80 (Date: 02/05/2021)      Interpretation of Score: 20 questions each scored on a 5-point scale with 0 representing "extreme difficulty or unable to perform" and 4 representing "no difficulty".  The lower the score,  the greater the functional disability. 80/80 represents no disability.  Minimal detectable change is 9 points.               Score  80  79-63  62-48  47-32  31-16  15-1  0              Modifier  CH  CI  CJ  CK  CL  CM  CN                   Treatment Included on Day of Evaluation:     Home Exercise Program:  [x]   Issued  [x]  Verbal   [x]  Written Handout [x]   Return Demonstration      Therapeutic Exercise:   Figure 4  3 x 20" L, hip flexor st 3 x 20", piriformis st 3 x 20" L, Knee to chest 3 x 20"             Therapist Signature:     Athena Masse, PT, 02/05/2021  Start Time: 9:10 AM     End Time: 1000                                               Evaluation Minutes:  30   Treatment Minutes: 20   Untimed Minutes:   Total Time: 50       Treatment Time Breakdown:  Modalities :   MT:     TE: 20    TA:   GT:    NMRE                 Form meets HCFA 700 requirements          Provider Number [id number]   Physical Therapy Evaluation Summary and Plan of Care      Patient: Barbara Elliott (70 y.o. female)     DOB:  Aug 20, 1951   Start of Care:  02/05/2021     Referring Physician: Mylinda Latina, FNP   PCP:  Mylinda Latina, FNP      Diagnosis: Left hip pain [M25.552]  Insurance: Payor: [employer] / Plan: [place] [facility] ACCESS / Product Type: Medicare  Advantage /     Subscriber # or HICN: [id number]     Treatment Diagnosis:  Impaired ROM, weakness, limited gait   Visits from [facility]:  1     Onset date:   Certification Period: From 02/05/2021 to         Prior Hospitalization:  Rehab Potential: good             Impression: Based on todays overall findings  Barbara Elliott (70 y.o. female) presents with pain affecting function, decrease ROM, decrease strength, impaired gait/ balance, decrease ADL/ functional  on uneven ground: Extreme difficulty or unable to perform activity   Making sharp turns while running fast: Extreme difficulty or unable to perform activity   Hopping: Extreme difficulty or unable to perform activity   Rolling over in bed: A little bit of difficulty      Initial Score: Total Score: 39 points/80     Most Recent Score: Total Score: 39 points/80 (Date: 02/05/2021)      Interpretation of Score: 20 questions each scored on a 5-point scale with 0 representing "extreme difficulty or unable to perform" and 4 representing "no difficulty".  The lower the score,  the greater the functional disability. 80/80 represents no disability.  Minimal detectable change is 9 points.               Score  80  79-63  62-48  47-32  31-16  15-1  0              Modifier  CH  CI  CJ  CK  CL  CM  CN                   Treatment Included on Day of Evaluation:     Home Exercise Program:  [x]   Issued  [x]  Verbal   [x]  Written Handout [x]   Return Demonstration      Therapeutic Exercise:   Figure 4  3 x 20" L, hip flexor st 3 x 20", piriformis st 3 x 20" L, Knee to chest 3 x 20"             Therapist Signature:     Athena Masse, PT, 02/05/2021  Start Time: 9:10 AM     End Time: 1000                                               Evaluation Minutes:  30   Treatment Minutes: 20   Untimed Minutes:   Total Time: 50       Treatment Time Breakdown:  Modalities :   MT:     TE: 20    TA:   GT:    NMRE                 Form meets HCFA 700 requirements          Provider Number [id number]   Physical Therapy Evaluation Summary and Plan of Care      Patient: Barbara Elliott (70 y.o. female)     DOB:  Aug 20, 1951   Start of Care:  02/05/2021     Referring Physician: Mylinda Latina, FNP   PCP:  Mylinda Latina, FNP      Diagnosis: Left hip pain [M25.552]  Insurance: Payor: [employer] / Plan: [place] [facility] ACCESS / Product Type: Medicare  Advantage /     Subscriber # or HICN: [id number]     Treatment Diagnosis:  Impaired ROM, weakness, limited gait   Visits from [facility]:  1     Onset date:   Certification Period: From 02/05/2021 to         Prior Hospitalization:  Rehab Potential: good             Impression: Based on todays overall findings  Barbara Elliott (70 y.o. female) presents with pain affecting function, decrease ROM, decrease strength, impaired gait/ balance, decrease ADL/ functional  Progress  Notes by Athena Masse, PT at 02/05/21 1884                Author: Athena Masse, PT  Service: Physical Therapy  Author Type: Physical Therapist       Filed: 02/05/21 1152  Date of Service: 02/05/21 0907  Status: Addendum          Editor: Athena Masse, PT (Physical Therapist)       Related Notes: Original Note by Athena Masse, PT (Physical Therapist) filed at 02/05/21 1039          Cosigner: Mylinda Latina, FNP at 02/06/21 0818                    Provider Number [id number]   Physical Therapy Initial Evaluation      Patient: Barbara Elliott (70 y.o. female)     DOB:  Dec 28, 1951   Exam Date: 02/05/2021   Start Time: 9:07 AM        Referring Physician: Mylinda Latina, FNP   PCP:  Mylinda Latina, FNP      Diagnosis: Left hip pain [M25.552]   Treatment Dx: impaired mobility, joint pain, limited flexibility   Insurance: Payor: [employer] / Plan: [place] [facility] ACCESS / Product Type: Medicare  Advantage /     Subscriber ID #: [id number]   Secondary insurance:         History of Present Illness/injury: History obtained from chart review and the patient.   Time of onset: 8 month(s) Onset Course: acute and gradual        [name] (71 y.o. female)  presenting today with ? L hip pain that comes/goes. Started back in August with moving process.  Did improve slightly but then didn't improve anymore and has aggravation with some activities like walking progressive distance.  When first getting up it's very painful. Standing prolonged is also aggravating.  Sometimes she takes Tylenol. No sensory changes.  Does feels weaker "no leverage".  Weightbearing/movement activities are what aggravate. Very stiff with donning shoes/socks  L. 3 years ago fracture her R tibia and had to compensate with L during the healing.  Recently moved from [place] to [place].  Now has 13 steps to second level leading with R leg due to the L hip.Was on a step ladder balancing for painting.       Previous PT/Other Services: for R LE  after fracture     Condition Since Onset:   []   Improving       [x]   Worsening      [x]  Fluctuating      []    Unchanged       Pertinent Medical Hx: (include comorbidities, diagnostic tests and meds)      has no past medical history on file.   Diagnostic Tests:  No results found.      Surgical Hx:  has a past surgical history that includes hx lumbar diskectomy (1998); ir cholecystostomy percutaneous (1980);  hx hysterectomy (2012); and hx other surgical.    Medication:     Current Outpatient Medications:    ?  amLODIPine (NORVASC) 10 mg tablet, Take 1 Tablet by mouth daily., Disp: , Rfl:    ?  atorvastatin (LIPITOR) 40 mg tablet, Take 1 Tablet by mouth daily., Disp: , Rfl:    ?  levothyroxine (SYNTHROID) 25 mcg tablet, Take 25 mcg by mouth Daily (before breakfast)., Disp: , Rfl:    ?  on uneven ground: Extreme difficulty or unable to perform activity   Making sharp turns while running fast: Extreme difficulty or unable to perform activity   Hopping: Extreme difficulty or unable to perform activity   Rolling over in bed: A little bit of difficulty      Initial Score: Total Score: 39 points/80     Most Recent Score: Total Score: 39 points/80 (Date: 02/05/2021)      Interpretation of Score: 20 questions each scored on a 5-point scale with 0 representing "extreme difficulty or unable to perform" and 4 representing "no difficulty".  The lower the score,  the greater the functional disability. 80/80 represents no disability.  Minimal detectable change is 9 points.               Score  80  79-63  62-48  47-32  31-16  15-1  0              Modifier  CH  CI  CJ  CK  CL  CM  CN                   Treatment Included on Day of Evaluation:     Home Exercise Program:  [x]   Issued  [x]  Verbal   [x]  Written Handout [x]   Return Demonstration      Therapeutic Exercise:   Figure 4  3 x 20" L, hip flexor st 3 x 20", piriformis st 3 x 20" L, Knee to chest 3 x 20"             Therapist Signature:     Athena Masse, PT, 02/05/2021  Start Time: 9:10 AM     End Time: 1000                                               Evaluation Minutes:  30   Treatment Minutes: 20   Untimed Minutes:   Total Time: 50       Treatment Time Breakdown:  Modalities :   MT:     TE: 20    TA:   GT:    NMRE                 Form meets HCFA 700 requirements          Provider Number [id number]   Physical Therapy Evaluation Summary and Plan of Care      Patient: Barbara Elliott (70 y.o. female)     DOB:  Aug 20, 1951   Start of Care:  02/05/2021     Referring Physician: Mylinda Latina, FNP   PCP:  Mylinda Latina, FNP      Diagnosis: Left hip pain [M25.552]  Insurance: Payor: [employer] / Plan: [facility] ACCESS / Product Type: Medicare  Advantage /     Subscriber # or HICN: [id number]     Treatment Diagnosis:  Impaired ROM, weakness, limited gait   Visits from [facility]:  1     Onset date:   Certification Period: From 02/05/2021 to         Prior Hospitalization:  Rehab Potential: good             Impression: Based on todays overall findings  Barbara Elliott (70 y.o. female) presents with pain affecting function, decrease ROM, decrease strength, impaired gait/ balance, decrease ADL/ functional

## 2021-02-06 ENCOUNTER — Encounter

## 2021-02-10 ENCOUNTER — Inpatient Hospital Stay: Admit: 2021-02-10 | Payer: PRIVATE HEALTH INSURANCE | Primary: Family

## 2021-02-10 DIAGNOSIS — M25559 Pain in unspecified hip: Secondary | ICD-10-CM

## 2021-02-10 NOTE — Progress Notes (Signed)
Called pt reviewed results and made aware of the referral and recommendations.  Herby Abraham, RN

## 2021-02-10 NOTE — Progress Notes (Signed)
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    Thank you,  Norm Parcel, FNP

## 2021-02-11 ENCOUNTER — Inpatient Hospital Stay: Admit: 2021-02-11 | Payer: PRIVATE HEALTH INSURANCE | Primary: Family

## 2021-02-11 NOTE — Progress Notes (Signed)
Progress  Notes by Athena Masse, PT at 02/11/21 1303                Author: Athena Masse, PT  Service: Physical Therapy  Author Type: Physical Therapist       Filed: 02/12/21 1123  Date of Service: 02/11/21 1303  Status: Signed          Editor: Athena Masse, PT (Physical Therapist)                    [facility]   Physical Therapy Treatment Note      Patient:  Barbara Elliott (70 y.o. female)              DOB: 07/18/1951  Treatment Date: 02/11/2021        Referring Physician: Mylinda Latina, FNP  Visits since [facility]: 2     Insurance: Primary:      Me [employer]   Insurance: Secondary:        Start Time: 1:03 PM                                                      Diagnosis: Pain in left hip [M25.552]  End Time: 133          Subjective:       Pain Level (Verbal Scale): 2/10   Compliance with HEP:  yes    Medication Changes: no (Refer to Medication List in EMR)   Improvement in condition  []    Improving  []  Unchanged  []   Fluctuating  []    Worsening    Patient Comments/functional update: Painful more with sitting on hard surfaces. No question on HEP.      Objective:      Observations/tests and measures:    *reviewed xray results       Therapeutic Exercise: 22     scifit x 5 min L 1.0   Leg ext 2 x 10 10 #   HSC 15# 2 x 10   Hip abd 15# 2 x 10   Patient Education/instruction: exercises , xray results, aquatic therapy, possible referral to ortho will discuss with primary         Manual treatments: 8    L hip stretching distraction, inf glides   Home Exercise Program:  []   Issued [x]  Reviewed []  Progressed []   Revised         []   Verbal []  Written Handout         Assessment:      Pain level post treatment: 2/10   Tolerance to today's treatment: Fair    Response to therapy: tolerated the activities well with no complications. No pain with activities today   Overall progress is considered to be: Fair    Will continue to benefit from overall limb strengthening and joint mob to tolerance          Plan:     Continue plan of care strength/joint mobility L hip         Therapist Signature:  Athena Masse, PT, 02/11/2021   Timed Minutes:  30   Untimed Minutes:    Total Treatment Time: 30       Timed Breakdown:       Modalities :   MT: 8  TE: 22    TA:   GT:    NMRE

## 2021-02-13 ENCOUNTER — Encounter

## 2021-02-18 ENCOUNTER — Inpatient Hospital Stay: Admit: 2021-02-18 | Payer: PRIVATE HEALTH INSURANCE | Primary: Family

## 2021-02-18 NOTE — Progress Notes (Signed)
 Progress  Notes by Marci Ronal HERO, PT at 02/18/21 1610                Author: Marci Ronal HERO, PT  Service: Physical Therapy  Author Type: Physical Therapist       Filed: 02/18/21 1748  Date of Service: 02/18/21 1610  Status: Signed          Editor: Marci Ronal HERO, PT (Physical Therapist)                    [facility]   Physical Therapy Treatment Note      Patient:  Barbara Elliott (70 y.o. female)              DOB: [birth year]  Treatment Date: 02/18/2021        Referring Physician: Roxanne Greig BRAVO, FNP  Visits since [facility]: 3     Insurance: Primary:      Me [facility], hospital   Insurance: Secondary:        Start Time: 1440 PM                                                      Diagnosis: Pain in left hip [M25.552]  End Time: 1510          Subjective:       Pain Level (Verbal Scale): 3/10   Compliance with HEP:  yes    Medication Changes: no (Refer to Medication List in EMR)   Improvement in condition  []    Improving  []  Unchanged  [x]   Fluctuating  []    Worsening    Patient Comments/functional update: I was really sore the second day after PT)      Objective:      Observations/tests and measures:        Therapeutic Exercise:      scifit x 3 min L 1.0         Manual treatments:     Strap closed chain L hip mob 2 x 10 flex/ext/abd   Supine L hip flexor/quad st off stable R hip flexed   Manual MFR R hip   L hip distraction 10 x 3 oscillations   DTM L vastus lateralis      Patient Education/instruction: activity modification and exercises what to expect after therapy, POC      Home Exercise Program:  []   Issued []  Reviewed []  Progressed []   Revised         []   Verbal []  Written Handout         Assessment:      Pain level post treatment: 0/10   Tolerance to today's treatment: Fair    Response to therapy: tolerated the activities well with no complications.    Overall progress is considered to be: Fair    Had good relief with stretching and mobilizations today. Keep HEP unchanged until next  session.         Plan:     Continue plan of care          Therapist Signature:  Ronal HERO Marci, PT, 02/18/2021   Timed Minutes:  30   Untimed Minutes:    Total Treatment Time: 30       Timed Breakdown:       Modalities :   MT: 25  TE: 5    TA:   GT:    NMRE

## 2021-02-20 NOTE — Telephone Encounter (Signed)
Name and DOB  confirmed  Type of referral: internal/external/ ED  internal             Were you seen in the ER (if yes, when and where) No    Date of Injury         Not a specific date - it's been ongoing for about a year off an on    What do you need to be seen for: Left hip    Who is the referring office and referring provider?        [facility]    Have you had xrays in the last 6 months?  Yes           Where:   [facility]         If not within 6 months please schedule            Have you had MRI within 6 months:       No     If yes where:            Have you had an EMG?      No      Where?             Is this going through the [place]?     No       If yes, please verify that we have an up to date referral with correct diagnosis.            Workers comp Yes/No  No           If yes, please bring WC info with you to appointment.              Have you done Physical/Occupational Therapy?       Yes - currently in PT    Where?                 [facility]    Have you had prior surgery before for this diagnosis?         No    Have you had a total joint replacement (hip or knee)?        (If yes please book with Namon Cirri )    Have you seen prior Ortho provider before?       No      If yes who?                   Please get a copy of previous records to bring with you to appointment.   Did scheduler request records?  n/a                    Please note our office is a scent neutral zone, please refrain from wearing perfume, cologne, and/or other fragrances.      Scheduled patient for NP visit with Dr Namon Cirri for 04/11/21 with an 11:15 arrival time.  (Xrays have been done within [facility] in the last 6 months).     Amy L Violette

## 2021-02-20 NOTE — Telephone Encounter (Signed)
Noted,x-rays 02/10/21

## 2021-02-25 ENCOUNTER — Inpatient Hospital Stay: Admit: 2021-02-25 | Payer: PRIVATE HEALTH INSURANCE | Primary: Family

## 2021-02-25 DIAGNOSIS — M25552 Pain in left hip: Secondary | ICD-10-CM

## 2021-02-25 NOTE — Progress Notes (Signed)
Progress  Notes by Athena Masse, PT at 02/25/21 [phone]                Author: Athena Masse, PT  Service: Physical Therapy  Author Type: Physical Therapist       Filed: 02/25/21 1232  Date of Service: 02/25/21 0917  Status: Addendum          Editor: Athena Masse, PT (Physical Therapist)          Related Notes: Original Note by Athena Masse, PT (Physical Therapist) filed at 02/25/21 1231                    [facility]   Physical Therapy Treatment Note      Patient:  Barbara Elliott (70 y.o. female)              DOB: 03-26-1951  Treatment Date: 02/25/2021        Referring Physician: Mylinda Latina, FNP  Visits since [facility]: 4     Insurance: Primary:      Me Doctor, hospital   Insurance: Secondary:        Start Time: 9:15 AM                                                      Diagnosis: Pain in left hip [M25.552]  End Time: 950          Subjective:       Pain Level (Verbal Scale): NR/10   Compliance with HEP:  yes    Medication Changes: no (Refer to Medication List in EMR)   Improvement in condition  [x]    Improving  []  Unchanged  []   Fluctuating  []    Worsening    Patient Comments/functional update: It's better with therapy sessions for a few days. Had a rough weekend with walking uneven ground, etc.  A little better today. Does want to do a few more therapy. Has appt with ortho 4/15.      Objective:      Observations/tests and measures:        Therapeutic Exercise:      scifit x 3 min L 1.0   Supine bridging 2 x 10   Hip abd 2 x 10 15#   Hip add isometric      Manual treatments:     Strap closed chain L hip mob 2 x 10 flex/ext/abd   Supine L hip flexor/quad st off stable R hip flexed   Manual MFR R hip   L hip distraction 10 x 3 oscillations   DTM L vastus lateralis   Patient Education/instruction: self care, activity modification and exercises, POC       Home Exercise Program:  []   Issued [x]  Reviewed []  Progressed []   Revised         []   Verbal []  Written Handout         Assessment:       Pain level post treatment: 1/10 'feels looser'   Tolerance to today's treatment: Good    Response to therapy: tolerated the activities well with no complications.    Overall progress is considered to be: Fair    Slow progress but no worse. Feeling better with HEP/PT. Will continue a few sessions until ortho consult - auth  10 visits thru 4/11.      Plan:     Continue plan of care progress note         Therapist Signature:  Athena Masse, PT, 02/25/2021   Timed Minutes:  35   Untimed Minutes:    Total Treatment Time: 35       Timed Breakdown:       Modalities :   MT: 15    TE: 20    TA:   GT:    NMRE

## 2021-03-04 ENCOUNTER — Inpatient Hospital Stay: Admit: 2021-03-04 | Payer: PRIVATE HEALTH INSURANCE | Primary: Family

## 2021-03-04 NOTE — Progress Notes (Addendum)
 Progress  Notes by Marci Ronal HERO, PT at 03/04/21 [phone]                Author: Marci Ronal HERO, PT  Service: Physical Therapy  Author Type: Physical Therapist       Filed: 03/19/21 0940  Date of Service: 03/04/21 0922  Status: Addendum          Editor: Marci Ronal HERO, PT (Physical Therapist)          Related Notes: Original Note by Marci Ronal HERO, PT (Physical Therapist) filed at 03/04/21 1237                    [facility]   Physical Therapy Treatment Note/D/C Note      Patient:  Barbara Elliott (70 y.o. female)              DOB: 11-Feb-1951  Treatment Date: 03/04/2021        Referring Physician: Roxanne Greig BRAVO, FNP  Visits since [facility]: 5     Insurance: Primary:      Me Doctor, hospital   Insurance: Secondary:        Start Time: 9:25 AM                                                      Diagnosis: Pain in left hip [M25.552]  End Time: 1000          Subjective:       Pain Level (Verbal Scale): 2/10 'just limited'  Compliance  with HEP: yes    Medication Changes: no (Refer to Medication List in EMR)   Improvement in condition  [x]    Improving  []  Unchanged  []   Fluctuating  []    Worsening    Patient Comments/functional update: Not taking anything consistently for the pain, just pre-emptive Tylenol  when needed.      Objective:      Observations/tests and measures:     Strength Testing:  []  ?   Not assessed        []  ?   Within Functional limits    []  ?   WFL except the following   (0 to 5 Scale)           Ref.  RIGHT  LEFT  Comments           Hip Flexion  L1,2,3  4-  3+             Hip Extension  L5  4-  3+             Hip Internal Rotation  L1,2,3  4-  3       Hip External Rotation  L5,S1  4-  3       Hip Abduction  L5,S1  4-  3+       Hip Adduction  L1,2  4-  3+             Knee Flexion (H.S)  L5,S1  4-  3+             Knee Extension (quad)  L3,4  4-  3+       Ankle Dorsiflexion  L4,5  4-  4  AROM  PROM                       Straight Leg Raise  (0-80)        70 +                Hip Flexion  (0-120)        95 + pn       Hip Extension  (0-30)        12 stiff       Hip Internal Rotation  (0-45)        15  + stiff       Hip External Rotation  (0-45)        20 + stiff       Hip Abduction  (0-40)        25 + pain inner thigh          *LEFS not retested today      Therapeutic Exercise:      Scifit x 4 min L 1.0   Hip abd 15# 2 x 15   Supine bridging 2 x 10 on ball   Isometric hip add 20 x towel sq   L hip IR/ER isometric manual         Manual treatments:     Strap closed chain L hip mob  x 20 flex/ext/abd   Supine L hip flexor/quad st off stable R hip flexed   Manual MFR R hip   L hip distraction 10 x 3 oscillations   DTM L vastus lateralis   Patient Education/instruction: self care, activity modification and exercises POC, aquatic PT, self mob with black band      Home Exercise Program:  []   Issued [x]  Reviewed []  Progressed []   Revised         []   Verbal []  Written Handout    *black band issued for self mob     Assessment:      Pain level post treatment: 1/10   Tolerance to today's treatment: Fair    Response to therapy: tolerated the activities well with no complications.    Overall progress is considered to be: Fair    Not any worse overall and does feel a little better after PT but then 3-4 days later it's back the same. Seeing ortho on 4/15.   Has 5 more approved visits [employer].       Short Term Goals:   By 3  weeks   1.) I with HEP daily with no sharp pain for increase in hip mobility by 5 deg needed for ADL's. Part met   2) Initiate PRE without pain aggravation in open chain presentation for improved muscle girth needed for gait support. met   3) Tolerate grade 2 joint mobs L hip with 25% less pain with sit to stand. met   Long Term Goals:  by 6 weeks  -progressing   1) Increase L hip mobility by 15 deg all limited directions for no restrictions with dressing.   2) Strength increase to 4-/5 L hip painfree for confidence with walking > 2 blocks.   3) Report  negative pain L hip with sit to stand from toileting.   4) LEFS < 15% impairment for ongoing strength/mobility needed to make house modifications     Plan:     D/c to HEP current and follow up with orthopedics.         Therapist Signature:  Ronal HERO  Marci, PT, 03/04/2021   Timed Minutes:  35   Untimed Minutes:    Total Treatment Time: 35       Timed Breakdown:       Modalities :   MT: 20    TE: 15    TA:   GT:    NMRE

## 2021-03-08 ENCOUNTER — Other Ambulatory Visit: Payer: Self-pay | Admitting: Physician Assistant

## 2021-03-08 DIAGNOSIS — I10 Essential (primary) hypertension: Secondary | ICD-10-CM

## 2021-03-10 NOTE — Telephone Encounter (Signed)
ROI completed and faxed to [facility] requesting medical records.  Faxed to [phone].    Thank you,  Beverlee Nims

## 2021-03-11 ENCOUNTER — Other Ambulatory Visit: Payer: Self-pay | Admitting: Physician Assistant

## 2021-03-11 DIAGNOSIS — I10 Essential (primary) hypertension: Secondary | ICD-10-CM

## 2021-03-12 MED ORDER — AMLODIPINE 10 MG TAB
10 mg | ORAL_TABLET | Freq: Every day | ORAL | 3 refills | Status: AC
Start: 2021-03-12 — End: ?

## 2021-03-12 NOTE — Telephone Encounter (Signed)
 Presley Gora  1951-10-09      Call back needed: yes    Preferred call back number: Call preference: Home phone   [phone] (home)    No relevant phone numbers on file.        Medications Requested:  Requested Prescriptions     Pending Prescriptions Disp Refills   . amLODIPine  (NORVASC ) 10 mg tablet       Sig: Take 1 Tablet by mouth daily.           Preferred Pharmacy:   [id number] [place], [place] - [address]  [address] [place] [address]  Phone: [phone] Fax: [phone]                                       Last appt @ PCP Office: 01/30/2021    Future Appointments   Date Time Provider Department Center   03/18/2021 10:45 AM Marci Ronal HERO, PT [facility]   03/25/2021  9:00 AM AMA NURSE AMA [facility] [address] [facility]   04/01/2021  9:00 AM Roxanne Greig BRAVO, FNP AMA [facility]   04/11/2021 11:30 AM Terrilyn Pama GAILS, MD ORTHO [facility]       MOST RECENT BLOOD PRESSURES  BP Readings from Last 3 Encounters:   01/30/21 (!) 146/80         MOST RECENT LAB DATA  No results found for: CREA, CREATEXT, K, POTEXT, ALT, TSH, TSHP, TSHEXT, CHOL, HGB, HGBEXT, HCT, HCTEXT, B12LT, URICACIDEXT, HBA1C, HBA1CPOC, HBA1CEXT, INR, TST5, TESFTT, TSHEXT, HGBEXT, HCTEXT, HBA1CEXT, INREXT    Kelly L Hatchcock

## 2021-03-12 NOTE — Telephone Encounter (Signed)
A. Date of last refill: NA  B. Date of last office visit pertaining to the medication being requested: 01/30/2021    C. Prescription Refill Protocol:   Medication amlodipine        Last Office Visit 1 year        Lab Monitoring none        Category Dihydropyridine Calcium Channel Blockers        Length of Refill 1 year        Brand Name NO       Comments If last BP > S140 or D90 then REFILL ONLY 1 and send a note to provider    D. Allergy List reviewed and Verified: Warren Danes. Review of possible medication to medication interactions: Ginette Pitman. Review last office visit pertaining to the medication being requested for adherence to assessment and plan: Pierre Bali. Review labs pertaining to the medication being requested. Determine if patient is due for an office visit or follow-up testing per protocol and document in patient's chart: Date    BP Readings from Last 3 Encounters:   01/30/21 (!) 146/80     Please advise   Signed By: Midge Aver     March 12, 2021

## 2021-03-18 ENCOUNTER — Encounter: Payer: PRIVATE HEALTH INSURANCE | Primary: Family

## 2021-03-18 NOTE — Telephone Encounter (Signed)
First attempt to contact via phone and Other    Notes:   Pt has been set up as a NP  [phone] (home)   Barbara Elliott

## 2021-03-25 ENCOUNTER — Inpatient Hospital Stay: Admit: 2021-03-25 | Payer: PRIVATE HEALTH INSURANCE | Primary: Family

## 2021-03-25 ENCOUNTER — Institutional Professional Consult (permissible substitution): Admit: 2021-03-25 | Discharge: 2021-03-25 | Payer: PRIVATE HEALTH INSURANCE | Primary: Family

## 2021-03-25 DIAGNOSIS — R7303 Prediabetes: Secondary | ICD-10-CM

## 2021-03-25 DIAGNOSIS — E039 Hypothyroidism, unspecified: Secondary | ICD-10-CM

## 2021-03-25 LAB — COMPREHENSIVE METABOLIC PANEL
ALT: 29 U/L (ref 12–78)
AST: 22 U/L (ref 10–37)
Albumin: 4 g/dL (ref 3.4–5.0)
Alkaline Phosphatase: 85 U/L (ref 43–117)
BUN: 14 MG/DL (ref 7–22)
CO2: 28 mmol/L (ref 21–32)
Calcium: 9.6 MG/DL (ref 8.5–10.1)
Chloride: 104 mmol/L (ref 98–108)
Creatinine: 0.78 MG/DL (ref 0.55–1.10)
EGFR IF NonAfrican American: >60 mL/min/{1.73_m2} (ref >60)
GFR African American: >60 mL/min/{1.73_m2} (ref >60)
Glucose: 98 mg/dL (ref 74–106)
Potassium: 3.7 mmol/L (ref 3.4–5.1)
Sodium: 136 mmol/L (ref 136–145)
Total Bilirubin: 1.1 mg/dL — ABNORMAL HIGH (ref 0.00–1.00)
Total Protein: 8.1 g/dL (ref 6.4–8.2)

## 2021-03-25 LAB — CBC WITH AUTO DIFFERENTIAL
Basophils %: 1 % (ref 0–2)
Basophils Absolute: 0 10*3/uL (ref 0.0–0.1)
Eosinophils %: 3 % (ref 0–5)
Eosinophils Absolute: 0.2 10*3/uL (ref 0.0–0.4)
Granulocyte Absolute Count: 0 10*3/uL (ref 0.00–0.04)
Hematocrit: 46.8 % (ref 37.0–47.0)
Hemoglobin: 15.4 g/dL (ref 12.0–16.0)
Immature Granulocytes: 0 % (ref 0.0–0.6)
Lymphocytes %: 25 % (ref 14–46)
Lymphocytes Absolute: 2 10*3/uL (ref 1.2–3.7)
MCH: 30.4 PG (ref 27.0–31.0)
MCHC: 32.9 g/dL — ABNORMAL LOW (ref 33.0–37.0)
MCV: 92.5 FL (ref 80.0–94.0)
MPV: 10.7 FL — ABNORMAL HIGH (ref 7.4–10.4)
Monocytes %: 11 % (ref 5–12)
Monocytes Absolute: 0.9 10*3/uL (ref 0.2–1.0)
NRBC Absolute: 0 10*3/uL
Neutrophils %: 60 % (ref 47–80)
Neutrophils Absolute: 4.8 10*3/uL (ref 1.6–6.1)
Nucleated RBCs: 0 PER 100 WBC
Platelets: 330 10*3/uL (ref 130–400)
RBC: 5.06 M/uL (ref 4.20–5.40)
RDW: 13.4 % (ref 11.5–14.5)
WBC: 8 10*3/uL (ref 4.5–10.9)

## 2021-03-25 LAB — HEMOGLOBIN A1C W/EAG
Hemoglobin A1C: 5.5 % (ref <5.7)
eAG: 111 mg/dL

## 2021-03-25 LAB — VITAMIN D 25 HYDROXY: Vitamin D, 25-Hydroxy, Total: 36.9 ng/mL

## 2021-03-25 LAB — LIPID PANEL W/ REFLEX DIRECT LDL
Chol/HDL Ratio: 2.5 NA
Cholesterol, Total: 187 MG/DL (ref 0–199)
HDL: 74 MG/DL (ref >50)
LDL Calculated: 78.4 MG/DL
Non HDL Chol. (LDL+VLDL): 113 mg/dL
Triglycerides: 173 MG/DL — ABNORMAL HIGH (ref <150)

## 2021-03-25 LAB — HEPATITIS C ANTIBODY: HCV Ab: NONREACTIVE

## 2021-03-25 LAB — T4, FREE
T4, Free: 0.8 ng/dL (ref 0.76–1.46)
T4, Free: 0.8 ng/dL (ref 0.76–1.46)

## 2021-03-25 LAB — THYROID CASCADE PROFILE: TSH, 3RD GENERATION: 3.97 u[IU]/mL — ABNORMAL HIGH (ref 0.358–3.740)

## 2021-03-25 LAB — LIPID PANEL W/ REFLX DIRECT LDL
CHOL/HDL Ratio: 2.5
Cholesterol, total: 187 MG/DL (ref 0–199)
HDL Cholesterol: 74 MG/DL (ref >50)
LDL, calculated: 78.4 MG/DL
N-HDL-C: 113 mg/dL
Triglyceride: 173 MG/DL — ABNORMAL HIGH (ref <150)

## 2021-03-25 LAB — CBC WITH AUTOMATED DIFF
ABS. BASOPHILS: 0 10*3/uL (ref 0.0–0.1)
ABS. EOSINOPHILS: 0.2 10*3/uL (ref 0.0–0.4)
ABS. IMM. GRANS.: 0 10*3/uL (ref 0.00–0.04)
ABS. LYMPHOCYTES: 2 10*3/uL (ref 1.2–3.7)
ABS. MONOCYTES: 0.9 10*3/uL (ref 0.2–1.0)
ABS. NEUTROPHILS: 4.8 10*3/uL (ref 1.6–6.1)
ABSOLUTE NRBC: 0 10*3/uL
BASOPHILS: 1 % (ref 0–2)
EOSINOPHILS: 3 % (ref 0–5)
HCT: 46.8 % (ref 37.0–47.0)
HGB: 15.4 g/dL (ref 12.0–16.0)
IMMATURE GRANULOCYTES: 0 % (ref 0.0–0.6)
LYMPHOCYTES: 25 % (ref 14–46)
MCH: 30.4 PG (ref 27.0–31.0)
MCHC: 32.9 g/dL — ABNORMAL LOW (ref 33.0–37.0)
MCV: 92.5 FL (ref 80.0–94.0)
MONOCYTES: 11 % (ref 5–12)
MPV: 10.7 FL — ABNORMAL HIGH (ref 7.4–10.4)
NEUTROPHILS: 60 % (ref 47–80)
NRBC: 0 PER 100 WBC
PLATELET: 330 10*3/uL (ref 130–400)
RBC: 5.06 M/uL (ref 4.20–5.40)
RDW: 13.4 % (ref 11.5–14.5)
WBC: 8 10*3/uL (ref 4.5–10.9)

## 2021-03-25 LAB — METABOLIC PANEL, COMPREHENSIVE
ALT (SGPT): 29 U/L (ref 12–78)
AST (SGOT): 22 U/L (ref 10–37)
Albumin: 4 g/dL (ref 3.4–5.0)
Alk. phosphatase: 85 U/L (ref 43–117)
BUN: 14 MG/DL (ref 7–22)
Bilirubin, total: 1.1 mg/dL — ABNORMAL HIGH (ref 0.00–1.00)
CO2: 28 mmol/L (ref 21–32)
Calcium: 9.6 MG/DL (ref 8.5–10.1)
Chloride: 104 mmol/L (ref 98–108)
Creatinine: 0.78 MG/DL (ref 0.55–1.10)
GFR est AA: >60 mL/min/{1.73_m2} (ref >60)
GFR est non-AA: >60 mL/min/{1.73_m2} (ref >60)
Glucose: 98 mg/dL (ref 74–106)
Potassium: 3.7 mmol/L (ref 3.4–5.1)
Protein, total: 8.1 g/dL (ref 6.4–8.2)
Sodium: 136 mmol/L (ref 136–145)

## 2021-03-25 LAB — HCV AB: Hepatitis C virus Ab: NONREACTIVE

## 2021-03-25 LAB — HEMOGLOBIN A1C WITH EAG
Est. average glucose: 111 mg/dL
Hemoglobin A1c: 5.5 % (ref <5.7)

## 2021-03-25 LAB — TSH CASCADE: TSH, 3rd generation: 3.97 u[IU]/mL — ABNORMAL HIGH (ref 0.358–3.740)

## 2021-03-25 LAB — VITAMIN D, 25 HYDROXY: VITAMIN D, 25-HYDROXY TOTAL: 36.9 ng/mL

## 2021-03-25 NOTE — Progress Notes (Signed)
Lab(s) Drawn:   A1C, CBC, HCV AB, Lipid, Vitamin D, TSH, CMP 1 Gold,  Light green, and 1 lavender tube drawn today. Patient tolerated with no issues or concerns.   [facility]wn at  [facility]  Needle used:  [x]  21 Ga  []  23 Ga  []  25 Ga  Sample obtained from:     [x]  Right arm    []  Left arm  []  Right hand  []  Left hand     Performed by: , MA

## 2021-03-27 ENCOUNTER — Other Ambulatory Visit: Payer: Self-pay | Admitting: Physician Assistant

## 2021-03-27 DIAGNOSIS — R6889 Other general symptoms and signs: Secondary | ICD-10-CM

## 2021-03-27 DIAGNOSIS — E781 Pure hyperglyceridemia: Secondary | ICD-10-CM

## 2021-03-27 DIAGNOSIS — E039 Hypothyroidism, unspecified: Secondary | ICD-10-CM

## 2021-03-27 DIAGNOSIS — E782 Mixed hyperlipidemia: Secondary | ICD-10-CM

## 2021-04-01 ENCOUNTER — Ambulatory Visit: Attending: Family | Primary: Family

## 2021-04-01 ENCOUNTER — Ambulatory Visit: Admit: 2021-04-01 | Discharge: 2021-04-01 | Payer: PRIVATE HEALTH INSURANCE | Attending: Family | Primary: Family

## 2021-04-01 DIAGNOSIS — Z0000 Encounter for general adult medical examination without abnormal findings: Secondary | ICD-10-CM

## 2021-04-01 DIAGNOSIS — I10 Essential (primary) hypertension: Secondary | ICD-10-CM

## 2021-04-01 MED ORDER — SHINGRIX (PF) 50 MCG/0.5 ML INTRAMUSCULAR SUSPENSION, KIT
50 mcg/0.5 mL | INTRAMUSCULAR | 1 refills | Status: AC
Start: 2021-04-01 — End: ?

## 2021-04-01 MED ORDER — LEVOTHYROXINE 50 MCG TAB
50 mcg | ORAL_TABLET | Freq: Every day | ORAL | 0 refills | Status: DC
Start: 2021-04-01 — End: 2021-05-27

## 2021-04-01 NOTE — Assessment & Plan Note (Signed)
Stable: patient currently taking atorvastatin 40 mg daily without side effects.

## 2021-04-01 NOTE — Progress Notes (Signed)
[facility]   [address]  [place] [id number]  [phone]    [facility] - SUBSEQUENT      CHIEF COMPLAINT   Barbara Elliott is a 70 y.o. female who presents today for Annual Wellness Visit.    HISTORY OF PRESENT ILLNESS     Patient is here for her annual wellness visit. [facility] information obtained from patient.    awv 04/01/21  PE 04/01/21  Mammo ordered  Colonoscopy pending records  dtap pending records  shingrix ordered  dexa pending records  pneumo pending records  A1C 5.5  Chol completed  HCV non reactive  Covid +boost compelted 11/04/20    She has continued pain in her left hip. She has completed xray and showed osteoarthrosis. She completed PT 6 weeks of physical therapy. She missed the very last one.  They did not go over the evaluation with her because she had missed the last one.  She is seeing Orthopedic on 04/11/21.  No recent falls or injurys. She is active working in her yard. She is constant up and moving, doing things outside the home and inside the home.     04/02/20 mammogram- No problems- ordered today  06/2020 colonoscopy- diverticulosis 5 year repeat.  Shingles vaccines- she received the prior shingles- ordered  Pneumo 23 - 2015  Dexa- bone density- results at risk for osteoporosis thinks specifically on left hip.  tdap- thinks it has been in the last 10 year.  Grand daughter is 469 (got it when her grand daughter was born)      CHOLESTEROL:    She is taking lipid lowering medication compliantly and denies any side effects.    Cardiac Risk Factors include:  Current Smoker  Hypertension  The ASCVD Risk score Denman George(Goff DC Jr., et al., 2013) failed to calculate for the following reasons:    Unable to determine if patient is Non-Hispanic African American    LIPID LABS  Lab Results   Component Value Date/Time    LDL, calculated 78.4 03/25/2021 08:51 AM    HDL Cholesterol 74 03/25/2021 08:51 AM    Triglyceride 173 (H)  03/25/2021 08:51 AM     Key Antihyperlipidemia Meds             atorvastatin (LIPITOR) 40 mg tablet (Taking) Take 1 Tablet by mouth daily.           Thyroid Hx:   She continues on thyroid supplementation without any side effects.      Fatigue:  no  Constipation:  no  Hair loss:  no  Skin changes:  no  Heart Palpitations:  no  Temperature intolerance:  no  Unexplained weight change:  no    Lab Results   Component Value Date/Time    TSH, 3rd generation 3.970 (H) 03/25/2021 08:51 AM    T4, Free 0.8 03/25/2021 08:51 AM     Reviewed on today's visit patient's past medical/surgical history, family/social history, medications allergies, previous lab work and imaging studies immunizations and preventive care      MEDICATIONS     Current Outpatient Medications   Medication Sig   ??? Shingrix, PF, 50 mcg/0.5 mL susr injection Administer 0.285ml IM injection once now and a second injection in 2-6 months  Indications: shingles vaccination   ??? levothyroxine (SYNTHROID) 50 mcg tablet Take 1 Tablet by mouth Daily (before breakfast). Indications: a condition with low thyroid hormone levels   ???  amLODIPine (NORVASC) 10 mg tablet Take 1 Tablet by mouth daily.   ??? atorvastatin (LIPITOR) 40 mg tablet Take 1 Tablet by mouth daily.   ??? multivitamin (ONE A DAY) tablet Take 1 Tablet by mouth daily.   ??? cholecalciferol, vitamin D3, (Vitamin D3) 50 mcg (2,000 unit) tab Take 1 Tablet by mouth daily.   ??? calcium carbonate (CALTREX) 600 mg calcium (1,500 mg) tablet Take 600 mg by mouth two (2) times a day.     No current facility-administered medications for this visit.     Medications Discontinued During This Encounter   Medication Reason   ??? levothyroxine (SYNTHROID) 25 mcg tablet Therapy Completed       ALLERGIES     Allergies   Allergen Reactions   ??? Codeine Shortness of Breath   ??? Lisinopril Cough       ACTIVE MEDICAL PROBLEMS     Patient Active Problem List   Diagnosis Code   ??? Encounter to establish care Z76.89   ??? Encounter for hepatitis C  screening test for low risk patient Z11.59   ??? Preventative health care Z00.00   ??? 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 Hypertension I10   ??? Prediabetes R73.03   ??? Current every day smoker F17.200   ??? Left hip pain M25.552   ??? Encounter for immunization Z23   ??? Screening mammogram, encounter for Z12.31   ??? Encounter for subsequent annual wellness visit (AWV) in Medicare patient Z00.00   ??? Vitamin D deficiency E55.9       PAST SURGICAL HISTORY     Past Surgical History:   Procedure Laterality Date   ??? HX HYSTERECTOMY  2012   ??? HX LUMBAR DISKECTOMY  1998   ??? HX OTHER SURGICAL      pelvic floor, mesh and raise bladder   ??? 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 Married to [employer].    4 children together:    - Barbara Elliott 1970     -Barbara Elliott 1972 Grand child- 1 boy    -Barbara Elliott Son 1975 Grand child -1 girl    -Barbara Elliott (Almyra Free) [phone] - 3 girls        She does currently smoke cigarettes - 1/2 a pack a week, not every day.    Her husband does smoke cigars.    She does not use illicit drugs.    She does drink wine 1-2 glasses at night.        She enjoys her time with grand children, reading.        Wears a seat belt in a car.               FAMILY HISTORY     Family History   Problem Relation Age of Onset   ??? Hypertension Mother    ??? Diabetes Father    ??? Colon Cancer Sister 46        colostomy   ??? Other Sister         prediabetic   ??? Obesity Sister    ??? No Known Problems Brother    ??? Mastectomy Maternal Grandmother 55        breast cancer   ??? No Known Problems Maternal Grandfather    ??? No Known Problems Paternal Grandmother    ??? Diabetes  Paternal Grandfather    ??? OSTEOARTHRITIS Brother 60        2 knee replacement   ??? No Known Problems Brother    ??? Uterine Cancer Neg Hx    ??? Ovarian Cancer Neg Hx        VITALS     Vitals:    04/01/21 0853   BP: 136/78   Pulse: 75   SpO2: 98%   Weight: 148 lb 6.4 oz (67.3 kg)    Height: 5' 1.5" (1.562 m)     Body mass index is 27.59 kg/m??.    BP Readings from Last 3 Encounters:   04/01/21 136/78   01/30/21 (!) 146/80     Wt Readings from Last 3 Encounters:   04/01/21 148 lb 6.4 oz (67.3 kg)   01/30/21 148 lb (67.1 kg)       PHYSICAL EXAM     General:  Pleasant, well-dressed and groomed, good historian  Eyes: PERRLA, non- injected  Ears: tympanic membranes are non-erythematous and in the neutral position, EAM without erythema or discharge  Nose: patent  Oropharynx: oral mucosa moist without lesions, no pharyngeal injection, tonsillar hypertrophy or exudate  Neck: no adenopathy, thyromegaly, masses, or bruits  Heart: regular rate and rhythm without murmur  Lungs: clear to auscultation  Abdomen: soft, nontender, no masses or organomegaly  Extremities: no edema, pulses are normal  Musculoskeletal:  No joint swelling or tenderness  Neurologic:  CNII-XII intact. Normal strength, sensation and reflexes throughout.  Skin:  No rash or suspicious skin lesions    WELLNESS EVALUATION & HEALTH RISK ASSESSMENT     DEPRESSION SCREENING  Severity:  1-4 low risk; 5-9 mild depressive symptoms; 10-14 mild major depression; 15-19 moderate major depression; 20-27 severe major depression  3 most recent PHQ Screens 04/01/2021 01/30/2021   Little interest or pleasure in doing things Not at all Not at all   Feeling down, depressed, irritable, or hopeless Not at all Not at all   Total Score PHQ 2 0 0          ANXIETY SCREENING    Severity:  0-5 none;   6-10 mild;   11-15 moderate;   16-21 severe  No flowsheet data found.    FALL RISK SCREENING  Fall Risk Assessment, last 12 mths 01/30/2021   Able to walk? Yes   Fall in past 12 months? 0   Do you feel unsteady? 1   Are you worried about falling 1       GENERAL  What matters most to you?: family  In general, how would you say your health is?: Good  Do you have a Living Will?: (!) No  Would you like more information about or help completing a Living Will?: Yes    HEALTH  HABITS AND NUTRITION  Do you worry whether your food will run out before you have money to buy more?: No  Have you lost any weight without trying in the past 3 months?: No    HEARING/VISION/SKIN  Do you or your family notice any trouble with your hearing?: No  Do you have difficulty driving, watching TV, or doing any of your daily activities because of your eyesight?: No  Do you have any skin concerns?: (!) Yes    SLEEP/MEMORY/ANXIETY  Do you have concerns about your sleep?: No  Do you often feel tired, fatigued, or sleepy during the daytime, even after a "good" night's sleep? : No  Do you or a family member have  concerns about your memory?: No  Over the last several months, have you been continually worried or anxious about a number of events or activities in your daily life? : (!) Yes   Yes- in response to the environment, covid, the war in [place], overall safety.      SUBSTANCE AND OPIOID USE  Do you currently drink alcohol?: Yes  On average, how many days per week do you drink alcohol?: 6  In the past year, have you used street drugs or prescription medications not prescribed to you?: No  Are you currently using a prescription opioid pain medication such as oxycodone, tramadol, hydrocodone or morphine?: No    SAFETY  Does your home have throw rugs, poor lighting, a slippery bathtub or shower, or clutter in the hallways?: No  Do all of your stairways have a railing or banister?: Yes  Do you have difficulty with balance or walking?: (!) Yes, regards to right hip and knee occasionally. Working with PT and Orthopedic.    ADLS  In the past 7 days, did you need help from others to perform any of the following everyday activities: eating dressing, grooming, bathing, toileting, walking/balance?: No  In the past 7 days, did you need help from others to take care of laundry, housekeeping, banking or paying bills, shopping, telephone use, food preparation, transportation or taking medications?: No    PHYSICAL  ACTIVITY  Are You Physically Active: Yes  Do You Have Difficulty Exercising: No  What Type of Activity Do You Engage In: Other  How Many Days Are You Active Per Week : 5-7  How Many Average Minutes of Activity Per Day: More than 60 Minutes     STOP-BANG SCORE (3+ high risk) (If indicated)       TIMED UP AND GO TEST (If indicated)       MINI-COG SCORE (If indicated)       PREVENTIVE CARE - SCREENINGS     IMMUNIZATIONS  Immunization History   Administered Date(s) Administered   ??? COVID-19, Pfizer Purple top, DILUTE for use, 12+ yrs, 37mcg/0.3mL dose 02/10/2020, 03/04/2020, 11/04/2020   ??? Influenza High Dose Vaccine PF 11/04/2020       ?? Pneumococcal Vaccine:   AGE > 65:    ?? Influenza Vaccine: Discussed annual vaccination.    ?? Shingles Vaccine:  (Shingrix for those over 50, 2 shots 2-6 months apart)  ?? Shingrix:  Rx was sent to pharmacy 04/01/2021  ?? TdaP (every 10 yrs)  ?? Action:  Up to date   ?? COVID-19 Vaccine status reviewed and discussed     Health Maintenance Topics with due status: Overdue       Topic Date Due    DTaP/Tdap/Td series Never done    Colorectal Cancer Screening Combo Never done    Shingrix Vaccine Age 52> Never done    Breast Cancer Screen Mammogram Never done    Bone Densitometry (Dexa) Screening Never done    Pneumococcal 65+ years Never done     Health Maintenance Topics with due status: Not Due       Topic Last Completion Date    A1C test (Diabetic or Prediabetic) 03/25/2021    Lipid Screen 03/25/2021    Medicare Yearly Exam 04/01/2021    Depression Screen 04/01/2021     Health Maintenance Topics with due status: Completed       Topic Last Completion Date    Flu Vaccine 11/04/2020    COVID-19 Vaccine 11/04/2020    Hepatitis  C Screening 03/25/2021       ?? Colon CA Screening:    ?? Colonoscopy (Q10Y), FIT test (Q1Y), or Cologuard (Q3Y) from 45-75 with consideration of ongoing screening from 76-85.    ?? Action:  UTD per patient  ?? Mammography (every 1-2 years, 53-75 y.o)    ?? Action:  Ordered  04/01/21  ?? Cervical CA Screening (stop at 72 y.o.)  ?? Action:  Not indicated  ?? Bone Density testing (DEXA):  (if over 65)    ?? Action:  pending records   ?? Abdominal aortic aneurysm screening:  (if over 52 with a FH of AAA)   ?? Action:  Not indicated   ?? Lung CA Screening with Low dose CT:    ?? 50-80 y.o., 20 PYH, and currently smoking or quit less than 15 years ago, Annually  ?? Tobacco Hx:   reports that she has been smoking cigarettes. She has a 4.00 pack-year smoking history. She has never used smokeless tobacco.  ?? Action:  Not indicated  ?? Hepatitis C screening:  (once for 18-79 y.o., annual for high risk)  ?? Action:  Completed and negative.  Lab Results   Component Value Date/Time    Hepatitis C virus Ab NONREACTIVE 03/25/2021 08:51 AM       WELCOME TO MEDICARE (ONLY)     EKG (Z3086)  EKG order placed: No    VISION SCREENING  No exam data present    INDIVIDUALIZED SCREENING, EDUCATION, AND PLAN     The patient isn't on high risk medication(s) including benzodiaepines.    Based on my evaluation of the patient and the Health Risk Assessment performed today, there isn't  evidence of cognitive impairment.    EDUCATION & COUNSELING PROVIDED (based on today's review & evaluation)  ?? Healthy diet, maintaining a healthy weight, and getting at least 30 minutes of exercise most every day.    ?? Advanced Directive Discussed  ?? Cholesterol screening:  (every 5 years after age 19)   ?? Diabetes screening: (40 to 70 who are overweight or obese)   ?? Reviewed safe amounts of alcohol use (No more than 1 drink per day).      Medicare Screening Schedule Reviewed and Handout with detailed personalized recommendations was given to the patient.    PATIENT CARE TEAM  Patient Care Team:  Mylinda Latina, FNP as PCP - General (Family Medicine)    ASSESSMENT AND PLAN      Diagnoses and all orders for this visit:    1. Encounter for subsequent annual wellness visit (AWV) in Medicare patient  Assessment & Plan:  Advanced directive  discussed and packet given to patient    awv 04/01/21  PE 04/01/21  Mammo ordered  Colonoscopy pending records  dtap pending records  shingrix ordered  dexa pending records  pneumo pending records  A1C 5.5  Chol completed  HCV non reactive  Covid +boost compelted 11/04/20      2. Preventative health care  -     CBC WITH AUTOMATED DIFF; Future  -     METABOLIC PANEL, COMPREHENSIVE; Future    3. Primary hypertension  Assessment & Plan:  Stable: blood pressure within range. Taking medication as prescribed. No problems with the medication or side effects noted.      4. Acquired hypothyroidism  Assessment & Plan:  ?? Reviewed thyroid cascade and the TSH is low.     ?? Plan:  Increase the thyroid supplement to daily .   ??  Recheck thyroid cascade and follow-up in 6 weeks.      Orders:  -     levothyroxine (SYNTHROID) 50 mcg tablet; Take 1 Tablet by mouth Daily (before breakfast). Indications: a condition with low thyroid hormone levels  -     TSH CASCADE; Future  -     TSH CASCADE; Future    5. Mixed hyperlipidemia  Assessment & Plan:  Stable: patient currently taking atorvastatin 40 mg daily without side effects.    Orders:  -     LIPID PANEL W/ REFLX DIRECT LDL; Future    6. Encounter for immunization  -     Shingrix, PF, 50 mcg/0.5 mL susr injection; Administer 0.59ml IM injection once now and a second injection in 2-6 months  Indications: shingles vaccination    7. Screening mammogram, encounter for  -     MAM 3D TOMO W MAMMO BI SCREENING INCL CAD; Future    8. Vitamin D deficiency  Assessment & Plan:  Mildly within the normal range- encouraged throughout the winter months to increase to 4000 international units  Daily.     Orders:  -     VITAMIN D, 25 HYDROXY; Future    9. Left hip pain  Assessment & Plan:  Completed PT. Scheduled with orthopedic for further evaluation and management.      10. Current every day smoker  Assessment & Plan:  Smokes 2 cigarettes per day only. Encouraged patient to quit smoking.  She  declined at this time with information and assistance.      11. Prediabetes  Assessment & Plan:  Recent A1C was 5.5     Orders:  -     HEMOGLOBIN A1C WITH EAG; Future      Follow-up and Dispositions    ?? Return for tsh lab-.           Future Appointments   Date Time Provider Department Center   04/07/2021  9:45 AM [facility] MAM 2 [facility]   04/11/2021 11:30 AM Irena Reichmann, MD ORTHO [facility]   05/13/2021 10:00 AM Mylinda Latina, FNP AMA [facility]       Barbara Elliott Richardean Sale, FNP  04/01/2021

## 2021-04-01 NOTE — Assessment & Plan Note (Signed)
??   Reviewed thyroid cascade and the TSH is low.     ?? Plan:  Increase the thyroid supplement to daily .   ?? Recheck thyroid cascade and follow-up in 6 weeks.

## 2021-04-01 NOTE — Assessment & Plan Note (Signed)
Recent A1C was 5.5

## 2021-04-01 NOTE — Assessment & Plan Note (Signed)
Stable: blood pressure within range. Taking medication as prescribed. No problems with the medication or side effects noted.

## 2021-04-01 NOTE — Assessment & Plan Note (Signed)
Smokes 2 cigarettes per day only. Encouraged patient to quit smoking.  She declined at this time with information and assistance.

## 2021-04-01 NOTE — Assessment & Plan Note (Signed)
Mildly within the normal range- encouraged throughout the winter months to increase to 4000 international units  Daily.

## 2021-04-01 NOTE — Assessment & Plan Note (Signed)
Completed PT. Scheduled with orthopedic for further evaluation and management.

## 2021-04-01 NOTE — Assessment & Plan Note (Signed)
Advanced directive discussed and packet given to patient    awv 04/01/21  PE 04/01/21  Mammo ordered  Colonoscopy pending records  dtap pending records  shingrix ordered  dexa pending records  pneumo pending records  A1C 5.5  Chol completed  HCV non reactive  Covid +boost compelted 11/04/20

## 2021-04-07 ENCOUNTER — Inpatient Hospital Stay: Admit: 2021-04-07 | Payer: PRIVATE HEALTH INSURANCE | Attending: Family | Primary: Family

## 2021-04-07 DIAGNOSIS — Z1231 Encounter for screening mammogram for malignant neoplasm of breast: Secondary | ICD-10-CM

## 2021-04-10 NOTE — Telephone Encounter (Signed)
Barbara Elliott is a 70 y.o. female  Patient has NP appt with Dr Namon Cirri on 04/11/21   She came down with a cold today   She has a stuffy nose , dry cough  No fever   She did do an at home covid test and it was negative     She can still come to appt tomorrow     [phone] (home)   [name]

## 2021-04-10 NOTE — Telephone Encounter (Signed)
Called and left message, per provider :  If shes willing to wear an N95 the whole time, I'm fine to see her. Otherwise if a cloth mask I'd prefer to wait until next week

## 2021-04-11 ENCOUNTER — Ambulatory Visit: Attending: Orthopaedic Surgery | Primary: Family

## 2021-04-11 ENCOUNTER — Ambulatory Visit
Admit: 2021-04-11 | Discharge: 2021-04-11 | Payer: PRIVATE HEALTH INSURANCE | Attending: Orthopaedic Surgery | Primary: Family

## 2021-04-11 DIAGNOSIS — M1612 Unilateral primary osteoarthritis, left hip: Secondary | ICD-10-CM

## 2021-04-11 NOTE — Progress Notes (Signed)
CC: left  hip pain    HPI:   The patient is a pleasant 70 y.o. year-old F who presents today for evaluation of left hip pain.  The hip has been painful for years. She has no Hx of trauma or surgery of the hip.  The pain has become increasingly severe over the past 6 months. The pain started in groin and is now moving towards lateral aspect of the hip. The pain occasionally radiates down the thigh typically when she wakes up.  The patient describes the pain as dull and aching in nature.  The pain is constant, and occasionally there are severe exacerbations of the pain.  The pain is exacerbated by prolonged standing and sudden movements.  From a functional standpoint, the patient has difficulty navigating stairs reciprocally and walking on uneven surfaces. She reports a worsening in her uneven gait.  The patient has tried conservative measures including Tylenol and physical therapy. They noticed improvement with physical therapy. Despite these treatments for greater than 6 months, the pain persists. The pain has become severe enough to interfere with activities of daily living.     She denies any extended steroid use, hx of heavy drinking, exposure to radiation, deep sea diving or FMHx of sickle cell. She is an occasional smoker, when she drinks wine. She has some days without smoking.    Past Medical History:   Diagnosis Date   ??? Headache    ??? High blood pressure    ??? High cholesterol    ??? Wears glasses        Current Outpatient Medications:   ???  Shingrix, PF, 50 mcg/0.5 mL susr injection, Administer 0.75ml IM injection once now and a second injection in 2-6 months  Indications: shingles vaccination, Disp: 0.5 mL, Rfl: 1  ???  levothyroxine (SYNTHROID) 50 mcg tablet, Take 1 Tablet by mouth Daily (before breakfast). Indications: a condition with low thyroid hormone levels, Disp: 60 Tablet, Rfl: 0  ???  amLODIPine (NORVASC) 10 mg tablet, Take 1 Tablet by mouth daily., Disp: 90 Tablet, Rfl: 3  ???  atorvastatin (LIPITOR) 40 mg  tablet, Take 1 Tablet by mouth daily., Disp: , Rfl:   ???  multivitamin (ONE A DAY) tablet, Take 1 Tablet by mouth daily., Disp: , Rfl:   ???  cholecalciferol, vitamin D3, (Vitamin D3) 50 mcg (2,000 unit) tab, Take 1 Tablet by mouth daily., Disp: , Rfl:   ???  calcium carbonate (CALTREX) 600 mg calcium (1,500 mg) tablet, Take 600 mg by mouth two (2) times a day., Disp: , Rfl:   Allergies   Allergen Reactions   ??? Codeine Shortness of Breath   ??? Lisinopril Cough     Past Surgical History:   Procedure Laterality Date   ??? HX HYSTERECTOMY  2012   ??? HX LUMBAR DISKECTOMY  1998   ??? HX OTHER SURGICAL      pelvic floor, mesh and raise bladder   ??? IR CHOLECYSTOSTOMY PERCUTANEOUS  1980     Family History   Problem Relation Age of Onset   ??? Hypertension Mother    ??? Diabetes Father    ??? Colon Cancer Sister 6030        colostomy   ??? Other Sister         prediabetic   ??? Obesity Sister    ??? No Known Problems Brother    ??? Mastectomy Maternal Grandmother [id number]        breast cancer   ??? No Known  Problems Maternal Grandfather    ??? No Known Problems Paternal Grandmother    ??? Diabetes Paternal Grandfather    ??? OSTEOARTHRITIS Brother 60        2 knee replacement   ??? No Known Problems Brother    ??? Uterine Cancer Neg Hx    ??? Ovarian Cancer Neg Hx      Social History     Socioeconomic History   ??? Marital status: MARRIED     Spouse name: [name]   ??? Number of children: 4   ??? Years of education: ADN   ??? Highest education level: Not on file   Occupational History   ??? Occupation: Retired     Comment: Engineer, civil (consulting)-   Tobacco Use   ??? Smoking status: Current Some Day Smoker     Packs/day: 0.10     Years: 40.00     Pack years: 4.00     Types: Cigarettes   ??? Smokeless tobacco: Never Used   Vaping Use   ??? Vaping Use: Never used   Substance and Sexual Activity   ??? Alcohol use: Yes     Alcohol/week: 1.0 standard drink     Types: 1 Glasses of wine per week     Comment: 1-2 glasses per night   ??? Drug use: Never   ??? Sexual activity: Yes     Partners: Male     Birth  control/protection: Surgical   Other Topics Concern   ??? 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    4 children together:    - Sean 1970     -Adam 1972 Grand child- 1 boy    -Lyla Son 1975 Grand child -1 girl    -[name] [phone] - 3 girls        She does currently smoke cigarettes - 1/2 a pack a week, not every day.    Her husband does smoke cigars.    She does not use illicit drugs.    She does drink wine 1-2 glasses at night.        She enjoys her time with grand children, reading.        Wears a seat belt in a car.             Social Determinants of Health     Financial Resource Strain:    ??? Difficulty of Paying Living Expenses: Not on file   Food Insecurity:    ??? Worried About Programme researcher, broadcasting/film/video in the Last Year: Not on file   ??? Ran Out of Food in the Last Year: Not on file   Transportation Needs:    ??? Lack of Transportation (Medical): Not on file   ??? Lack of Transportation (Non-Medical): Not on file   Physical Activity:    ??? Days of Exercise per Week: Not on file   ??? Minutes of Exercise per Session: Not on file   Stress:    ??? Feeling of Stress : Not on file   Social Connections:    ??? Frequency of Communication with Friends and Family: Not on file   ??? Frequency of Social Gatherings with Friends and Family: Not on file   ??? Attends Religious Services: Not on file   ??? Active Member of Clubs or Organizations: Not on file   ??? Attends Club or Organization Meetings: Not on file   ??? Marital Status: Not on file  Intimate Partner Violence:    ??? Fear of Current or Ex-Partner: Not on file   ??? Emotionally Abused: Not on file   ??? Physically Abused: Not on file   ??? Sexually Abused: Not on file   Housing Stability:    ??? Unable to Pay for Housing in the Last Year: Not on file   ??? Number of Places Lived in the Last Year: Not on file   ??? Unstable Housing in the Last Year: Not on file         ROS: Musculoskeletal and general systems were reviewed with the patient and are  negative except as above.    Examination: There were no vitals taken for this visit.  The patient is awake, alert and appropriate.  The head is normocephalic.  The sclerae are white.  The skin has normal turgor.  The abdomen is non-protruded.  The chest expands normally with deep inspiration.  The cardiac rhythm is regular when the pulse is palpated at the wrist.    Focused examination of the right hip reveals normal-appearing skin over the trochanter.  The trochanter is nontender.  In the seated position rotational motion of the right hip is not painful.  Hip flexion strength is normal.  Abduction strength is normal.  In the supine position, the hip flexes to 90 degrees abducts 30 degrees and adducts 10 degrees. There is a negative Stinchfield test.  There is a negative anterior impingement test.  Femoral and sciatic nerve function are intact.  The calf is soft and supple.  Pedal pulses are palpable.    Focused examination of the left hip reveals normal-appearing skin over the trochanter.  The trochanter is nontender.  In the seated position rotational motion of the left hip is painful.   This is worse with internal rotation.  Hip flexion strength is full.  Abduction strength is full.  Adduction strength is full. In the supine position, the hip flexes to 110 degrees, abducts 30 degrees, and adducts 10 degrees.  There is no pain throughout the extremes of motion.  There is a positive Stinchfield test.  There is a positive anterior impingement test, pain only with internal rotation.  Femoral and sciatic nerve function are intact.  The calf is soft and supple.  Pedal pulses are palpable.    Imaging: Xrays previously obtained at [facility] were independently reviewed and demonstrate:  There is an AP view of the pelvis and hip and a lateral view of the left hip. On the AP view of the pelvis, there is possible sourcil sclerosis of the left hip. There is mild superior narrowing of the left hip joint space. There are  periarticular osteophytes involving the left hip. There are cystic changes to femoral head and supraacetabular region. There is slight irregularity of the femoral head at about 10:00 oclock, possible crescent sign.  On the lateral view, the left proximal femur appear normal in the sagittal plane. There is moderate arthrosis of the joint. Soft tissue shadowing is normal. There are no fractures.     Assessment: 70 y.o. F with left hip osteoarthritis.    Plan:   I have discussed the diagnosis with the patient.  We have discussed the natural history of hip joint arthrosis. We have discussed both non-surgical and surgical treatment options; these include activity modification, supplements such as tumeric and glucosamine chondroitin, antiinflammatory medication, physical therapy, radiofrequency nerve ablation, and ultimately total hip arthroplasty. Despite treatment with conservative measures, the patient reports ongoing symptoms that  interfere with quality of life and desired activities.     Because of the irregularity of the femoral head on her plain radiograph, I am concerned that this may be a crescent lesion from underlying avascular necrosis.  I would like to work her up with MRI to determine whether or not there is any osteonecrosis; if this is simply osteoarthritis then that would allow was to the use steroid injections to the help control her pain and potentially avoid surgery.  If there is avascular necrosis then I would be hesitant to introduce intra-articular steroids out of concern for potentially compromising her hip.    She does not need surgery at this time. We will proceed conservatively with over-the-counter modalities. I have provided her with a list of over-the-counter cream, medications and supplements that can be used to help mitigate the pain. I have ordered MRI to rule in/out osteonecrosis and will refer her to physical therapy. Follow up in 2 months.     The patient understands the plan and will  follow up accordingly.    This chart was dictated using M Modal, a voice to text software; inherent to using this technology, there may be incorrectly spelled words, or erroneous phrases. Please call the office with any questions or concerns.    Scribe Attestation:   April 11, 2021 at 11:28 AM - Salina April scribing for and in the presence of Dr.Logen Fowle Shirley Muscat, Editor, commissioning.  I was present with Patient and was seen examined and evaluated by me 04/11/2021 11:24 AM and I agree with the documentation by scribe.

## 2021-04-21 ENCOUNTER — Inpatient Hospital Stay: Admit: 2021-04-21 | Payer: PRIVATE HEALTH INSURANCE | Primary: Family

## 2021-04-21 ENCOUNTER — Encounter

## 2021-04-21 DIAGNOSIS — Z0189 Encounter for other specified special examinations: Secondary | ICD-10-CM

## 2021-05-13 ENCOUNTER — Ambulatory Visit: Attending: Family | Primary: Family

## 2021-05-13 ENCOUNTER — Ambulatory Visit: Admit: 2021-05-13 | Discharge: 2021-05-13 | Payer: PRIVATE HEALTH INSURANCE | Attending: Family | Primary: Family

## 2021-05-13 DIAGNOSIS — Z23 Encounter for immunization: Secondary | ICD-10-CM

## 2021-05-13 MED ORDER — METOPROLOL TARTRATE 25 MG TAB
25 mg | ORAL_TABLET | Freq: Two times a day (BID) | ORAL | 1 refills | Status: DC
Start: 2021-05-13 — End: 2021-05-27

## 2021-05-13 NOTE — Assessment & Plan Note (Signed)
??   I recommended eating a healthy diet, low-sodium intake (<1.5gm/day), and regular aerobic exercise (150 minutes or more per week).  ?? Encouraged home blood pressure monitoring, and follow up if systolic blood pressure is over 140 or diastolic pressure is over 90 consistently.  ?? Blood pressure is elevated - continue current medication and start metoprolol 12.5 mg BID.

## 2021-05-13 NOTE — Assessment & Plan Note (Signed)
No change with medication. Plan to stop the medication, reduce by half for the first week then stop after that. Will plan to recheck her TSH in 3 months and follow up if her symptoms return or she has adverse reactions.

## 2021-05-13 NOTE — Progress Notes (Signed)
[facility]   [address]  [place] [address]  [phone]    CHIEF COMPLAINT     Barbara Elliott is a 70 y.o. female who presents for an acute visit due to Thyroid Problem.    HPI   Patient is here to follow up on her thyroid labs. She has been taking 50 mcg of synthroid. She states she has not noticed much of a difference with the medication.     Thyroid Hx:   She continues on thyroid supplementation without any side effects.      Fatigue:  no  Bowel changes:  no  Hair loss:  no  Skin changes:  no but chronic dry skin.  Heart Palpitations:  no hx of but not on a regular basis  Temperature intolerance:  no  Unexplained weight change:  no    Lab Results   Component Value Date/Time    TSH, 3rd generation 3.970 (H) 03/25/2021 08:51 AM    T4, Free 0.8 03/25/2021 08:51 AM     She is interested in trying to come off the medication and will monitor for any changes in her daily life to see if the medication made a difference or did not.        HYPERTENSION:    She reports good medication compliance and no side effects.   Home blood pressure monitoring is 140's  Chest pain:  no  Dizziness:  no  New or worsening leg edema  yes - but minimal and not new.    BP Readings from Last 3 Encounters:   05/13/21 134/76   04/01/21 136/78   01/30/21 (!) 146/80     Lab Results   Component Value Date/Time    Potassium 3.7 03/25/2021 08:51 AM    Creatinine 0.78 03/25/2021 08:51 AM      Key CAD CHF Meds             metoprolol tartrate (LOPRESSOR) 25 mg tablet (Taking) Take 0.5 Tablets by mouth two (2) times a day. Indications: high blood pressure    amLODIPine (NORVASC) 10 mg tablet (Taking) Take 1 Tablet by mouth daily.    atorvastatin (LIPITOR) 40 mg tablet (Taking) Take 1 Tablet by mouth daily.          She states she continues to have left hip pain. She did see orthopedic and has an MRI scheduled. She was previously successful with PT. She states she is limited on her ROM, her pain. There was a discussion of  non surgical as well as surgical options. Patient would like to not have a surgical option at this time given her age.      Reviewed on today's visit patient's past medical/surgical history, family/social history, medications allergies, previous lab work and imaging studies immunizations and preventive care    PHYSICAL EXAM     Vitals signs reviewed.   Constitutional:       General: Patient is not in any acute distress.     Appearance: Patient is well-developed and well nourished.  HENT:      Head: Normocephalic.   Cardiovascular:      Rate and Rhythm: Normal rate and regular rhythm.      Heart sounds: Normal heart sounds. No murmur. No friction rub.      No edema present to lower extremities.  Pulmonary:      Effort: Pulmonary effort is normal. No respiratory distress      Breath sounds: Normal breath sounds throughout.  No wheezing or rales.   Neurological:      Mental Status: Patient is oriented to person, place, and time.      Cranial Nerves: No cranial nerve deficit  Psychiatric:         Mood and Affect:    Mood normal     Behavior: Behavior normal.         Thought Content: Thought content normal.         Judgment: Judgment normal.       MEDICATIONS     Current Outpatient Medications   Medication Sig   ??? metoprolol tartrate (LOPRESSOR) 25 mg tablet Take 0.5 Tablets by mouth two (2) times a day. Indications: high blood pressure   ??? levothyroxine (SYNTHROID) 50 mcg tablet Take 1 Tablet by mouth Daily (before breakfast). Indications: a condition with low thyroid hormone levels   ??? amLODIPine (NORVASC) 10 mg tablet Take 1 Tablet by mouth daily.   ??? atorvastatin (LIPITOR) 40 mg tablet Take 1 Tablet by mouth daily.   ??? multivitamin (ONE A DAY) tablet Take 1 Tablet by mouth daily.   ??? cholecalciferol, vitamin D3, (Vitamin D3) 50 mcg (2,000 unit) tab Take 1 Tablet by mouth daily.   ??? calcium carbonate (CALTREX) 600 mg calcium (1,500 mg) tablet Take 600 mg by mouth two (2) times a day.   ??? Shingrix, PF, 50 mcg/0.5 mL  susr injection Administer 0.17ml IM injection once now and a second injection in 2-6 months  Indications: shingles vaccination (Patient not taking: Reported on 05/13/2021)     No current facility-administered medications for this visit.     There are no discontinued medications.    ALLERGIES     Allergies   Allergen Reactions   ??? Codeine Shortness of Breath   ??? Lisinopril Cough       ACTIVE MEDICAL PROBLEMS     Patient Active Problem List   Diagnosis Code   ??? Encounter to establish care Z76.89   ??? Encounter for hepatitis C screening test for low risk patient Z11.59   ??? Preventative health care Z00.00   ??? 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 Hypertension I10   ??? Prediabetes R73.03   ??? Current every day smoker F17.200   ??? Left hip pain M25.552   ??? Encounter for immunization Z23   ??? Screening mammogram, encounter for Z12.31   ??? Encounter for subsequent annual wellness visit (AWV) in Medicare patient Z00.00   ??? 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    4 children together:    - Sean 1970     -Adam 1972 Grand child- 1 boy    -Lyla Son 1975 Grand child -1 girl    -[name] [phone] - 3 girls        She does currently smoke cigarettes - 1/2 a pack a week, not every day.    Her husband does smoke cigars.    She does not use illicit drugs.    She does drink wine 1-2 glasses at night.        She enjoys her time with grand children, reading.        Wears a seat belt in a car.             VITALS  Vitals:    05/13/21 0957   BP: 134/76   Pulse: 82   SpO2: 99%   Weight: 151 lb 6.4 oz (68.7 kg)   Height: 5' 1.5" (1.562 m)     Body mass index is 28.14 kg/m??.      LABS & IMAGING   No results found for this or any previous visit (from the past 24 hour(s)).    ASSESSMENT AND PLAN     Diagnoses and all orders for this visit:    1. Encounter for immunization  -     COVID-19, MODERNA BOOSTER VACCINE  0.25ML DOSE    2. Primary hypertension  Assessment & Plan:  ?? I recommended eating a healthy diet, low-sodium intake (<1.5gm/day), and regular aerobic exercise (150 minutes or more per week).  ?? Encouraged home blood pressure monitoring, and follow up if systolic blood pressure is over 140 or diastolic pressure is over 90 consistently.  ?? Blood pressure is elevated - continue current medication and start metoprolol 12.5 mg BID.      Orders:  -     metoprolol tartrate (LOPRESSOR) 25 mg tablet; Take 0.5 Tablets by mouth two (2) times a day. Indications: high blood pressure    3. Left hip pain  Assessment & Plan:  Chronic- completed PT. New referral for PT from orthopedic. Scheduled MRI 5/25//2022. Follow up with orthopedic about 1 month after.        4. Acquired hypothyroidism  Assessment & Plan:  No change with medication. Plan to stop the medication, reduce by half for the first week then stop after that. Will plan to recheck her TSH in 3 months and follow up if her symptoms return or she has adverse reactions.     Orders:  -     TSH CASCADE; Future    Follow-up and Dispositions    ?? Return in about 2 weeks (around 05/27/2021) for blood pressure and medication metoprolol eval..           Future Appointments   Date Time Provider Department Center   05/21/2021 11:30 AM [facility] MRI 1 [facility]   05/27/2021 11:30 AM Mylinda Latina, FNP [facility]   06/23/2021 11:15 AM Irena Reichmann, MD [facility]       Mylinda Latina, FNP  05/13/2021

## 2021-05-13 NOTE — Assessment & Plan Note (Signed)
Chronic- completed PT. New referral for PT from orthopedic. Scheduled MRI 5/25//2022. Follow up with orthopedic about 1 month after.

## 2021-05-21 ENCOUNTER — Ambulatory Visit: Payer: PRIVATE HEALTH INSURANCE | Primary: Family

## 2021-05-21 ENCOUNTER — Encounter

## 2021-05-25 ENCOUNTER — Encounter

## 2021-05-27 ENCOUNTER — Ambulatory Visit: Attending: Family | Primary: Family

## 2021-05-27 ENCOUNTER — Ambulatory Visit: Admit: 2021-05-27 | Discharge: 2021-05-27 | Payer: PRIVATE HEALTH INSURANCE | Attending: Family | Primary: Family

## 2021-05-27 DIAGNOSIS — E039 Hypothyroidism, unspecified: Secondary | ICD-10-CM

## 2021-05-27 MED ORDER — LEVOTHYROXINE 50 MCG TAB
50 mcg | ORAL_TABLET | ORAL | 0 refills | Status: DC
Start: 2021-05-27 — End: 2021-05-27

## 2021-05-27 MED ORDER — METOPROLOL SUCCINATE SR 25 MG 24 HR TAB
25 mg | ORAL_TABLET | Freq: Every day | ORAL | 1 refills | Status: DC
Start: 2021-05-27 — End: 2021-06-23

## 2021-05-27 NOTE — Telephone Encounter (Signed)
Barbara Elliott is a 70 y.o. female  Rosanne Sack calling from [facility] customer care    She is calling regarding   Left hip MRI     They will need additional clinical or see if   Dr Namon Cirri will do a peer to peer       Reference #  [id number]    Reason : looking for notes about the start of the problem  Needs last ov notes  And prior imaging   Any treatments  They have gotten NO type of records from [place]    Fax #  [phone]  cb # [phone]      [name]

## 2021-05-27 NOTE — Progress Notes (Signed)
Barbara Elliott   [address]  [place] [address]  [phone]    CHIEF COMPLAINT   Barbara Elliott is a 70 y.o. female who presents today for follow-up of Blood Pressure Check.    HISTORY OF PRESENT ILLNESS   Pt reports that her BP has been better. She had one that the systolic was in the 150s all the others have been in the 120 or 130.     Denies any chest pain, pressure, of SOB. Denies any swelling of the hands, face, or lower extremities.     Denies headaches.      HYPERTENSION:    She reports good medication compliance and no side effects.   Home blood pressure monitoring is on average 120's 130's  Chest pain:  no  Dizziness:  no  New or worsening leg edema  no    BP Readings from Last Barbara Encounters:   05/27/21 138/76   05/13/21 134/76   04/01/21 136/78     Lab Results   Component Value Date/Time    Potassium Barbara.7 03/25/2021 08:51 AM    Creatinine 0.78 03/25/2021 08:51 AM      Key CAD CHF Meds             metoprolol succinate (TOPROL-XL) 25 mg XL tablet (Taking) Take 1 Tablet by mouth daily. Indications: high blood pressure    amLODIPine (NORVASC) 10 mg tablet (Taking) Take 1 Tablet by mouth daily.    atorvastatin (LIPITOR) 40 mg tablet (Taking) Take 1 Tablet by mouth daily.          Thyroid Hx:   She is not currently taking any thyroid supplementation.      Fatigue:  no  Bowel changes:  no  Hair loss:  no  Skin changes:  no  Heart Palpitations:  no  Temperature intolerance:  no  Unexplained weight change:  no    Lab Results   Component Value Date/Time    TSH, 3rd generation Barbara.970 (H) 03/25/2021 08:51 AM    T4, Free 0.8 03/25/2021 08:51 AM     Reviewed on today's visit patient's past medical/surgical history, family/social history, medications allergies, previous lab work and imaging studies immunizations and preventive care      PHYSICAL EXAM     Vitals signs reviewed.   Constitutional:       General: Patient is not in any acute distress.     Appearance: Patient is well-developed and well  nourished.  HENT:      Head: Normocephalic.   Cardiovascular:      Rate and Rhythm: Normal rate and regular rhythm.      Heart sounds: Normal heart sounds. No murmur. No friction rub.      No edema present to lower extremities.  Pulmonary:      Effort: Pulmonary effort is normal. No respiratory distress      Breath sounds: Normal breath sounds throughout. No wheezing or rales.   Neurological:      Mental Status: Patient is oriented to person, place, and time.      Cranial Nerves: No cranial nerve deficit  Psychiatric:         Mood and Affect:    Mood normal     Behavior: Behavior normal.         Thought Content: Thought content normal.         Judgment: Judgment normal.       MEDICATIONS     Current Outpatient Medications  Medication Sig   ??? metoprolol succinate (TOPROL-XL) 25 mg XL tablet Take 1 Tablet by mouth daily. Indications: high blood pressure   ??? amLODIPine (NORVASC) 10 mg tablet Take 1 Tablet by mouth daily.   ??? atorvastatin (LIPITOR) 40 mg tablet Take 1 Tablet by mouth daily.   ??? multivitamin (ONE A DAY) tablet Take 1 Tablet by mouth daily.   ??? cholecalciferol, vitamin D3, (Vitamin D3) 50 mcg (2,000 unit) tab Take 1 Tablet by mouth daily.   ??? calcium carbonate (CALTREX) 600 mg calcium (1,500 mg) tablet Take 600 mg by mouth two (2) times a day.   ??? Shingrix, PF, 50 mcg/0.5 mL susr injection Administer 0.22ml IM injection once now and a second injection in 2-6 months  Indications: shingles vaccination (Patient not taking: Reported on 05/27/2021)     No current facility-administered medications for this visit.     Medications Discontinued During This Encounter   Medication Reason   ??? levothyroxine (SYNTHROID) 50 mcg tablet Therapy Completed   ??? metoprolol tartrate (LOPRESSOR) 25 mg tablet Other       ALLERGIES     Allergies   Allergen Reactions   ??? Codeine Shortness of Breath   ??? Lisinopril Cough       ACTIVE MEDICAL PROBLEMS     Patient Active Problem List   Diagnosis Code   ??? Encounter for hepatitis C  screening test for low risk patient Z11.59   ??? Preventative health care Z00.00   ??? 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 Hypertension I10   ??? Prediabetes R73.03   ??? Current every day smoker F17.200   ??? Left hip pain M25.552   ??? Encounter for immunization Z23   ??? Screening mammogram, encounter for Z12.31   ??? Encounter for subsequent annual wellness visit (AWV) in Medicare patient Z00.00   ??? 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 Married to Barbara Elliott.    4 children together:    - Barbara Elliott 1970     -Barbara Elliott 1972 Grand child- 1 boy    -Barbara Elliott 1975 Grand child -1 girl    -Barbara Manis (Almyra Free) [phone] - Barbara Elliott        She does currently smoke cigarettes - 1/2 a pack a week, not every day.    Her husband does smoke cigars.    She does not use illicit drugs.    She does drink wine 1-2 glasses at night.        She enjoys her time with grand children, reading.        Wears a seat belt in a car.               VITALS     Vitals:    05/27/21 1125   BP: 138/76   Pulse: 72   SpO2: 98%   Weight: 151 lb (68.5 kg)   Height: 5' 1.5" (1.562 m)     Body mass index is 28.07 kg/m??.    BP Readings from Last Barbara Encounters:   05/27/21 138/76   05/13/21 134/76   04/01/21 136/78     Wt Readings from Last Barbara Encounters:   05/27/21 151 lb (68.5 kg)   05/13/21 151 lb 6.4 oz (68.7 kg)   04/01/21 148 lb 6.4 oz (67.Barbara kg)  ASSESSMENT AND PLAN     Diagnoses and all orders for this visit:    1. Acquired hypothyroidism  Assessment & Plan:  ?? Reviewed thyroid cascade and the TSH is slightly elevated but the Free T4 is in the normal range.     ?? Plan:  no medication at this time.Marland Kitchen   ?? Recheck thyroid cascade and follow-up in one year.        2. Primary hypertension  Assessment & Plan:  ?? I recommended eating a healthy diet, low-sodium intake (<1.5gm/day), and regular aerobic exercise (150 minutes or more per week).  ?? Encouraged home blood pressure  monitoring, and follow up if systolic blood pressure is over 140 or diastolic pressure is over 90 consistently.  ?? Blood pressure is normotensive - continue current treatment and no change needed.        Other orders  -     metoprolol succinate (TOPROL-XL) 25 mg XL tablet; Take 1 Tablet by mouth daily. Indications: high blood pressure    Follow-up and Dispositions    ?? Return in about 1 year (around 05/27/2022) for annual wellness exam.         Future Appointments   Date Time Provider Department Center   06/20/2021  1:30 PM [facility] MRI 1 [facility]   06/23/2021 11:15 AM Irena Reichmann, MD ORTHO [facility]   04/02/2022  9:00 AM Mylinda Latina, FNP [facility]         Jaeleigh Monaco Richardean Sale, FNP  05/27/2021

## 2021-05-27 NOTE — Telephone Encounter (Signed)
Records faxed

## 2021-05-27 NOTE — Assessment & Plan Note (Signed)
??   Reviewed thyroid cascade and the TSH is slightly elevated but the Free T4 is in the normal range.     ?? Plan:  no medication at this time.Marland Kitchen   ?? Recheck thyroid cascade and follow-up in one year.

## 2021-05-27 NOTE — Assessment & Plan Note (Signed)
??   I recommended eating a healthy diet, low-sodium intake (<1.5gm/day), and regular aerobic exercise (150 minutes or more per week).  ?? Encouraged home blood pressure monitoring, and follow up if systolic blood pressure is over 140 or diastolic pressure is over 90 consistently.  ?? Blood pressure is normotensive - continue current treatment and no change needed.

## 2021-06-07 ENCOUNTER — Inpatient Hospital Stay: Admit: 2021-06-07 | Payer: PRIVATE HEALTH INSURANCE | Attending: Registered Nurse | Primary: Family

## 2021-06-07 ENCOUNTER — Inpatient Hospital Stay
Admit: 2021-06-07 | Discharge: 2021-06-07 | Disposition: A | Discharge status: Home / Self Care | Payer: PRIVATE HEALTH INSURANCE | Attending: Registered Nurse

## 2021-06-07 DIAGNOSIS — S82831A Other fracture of upper and lower end of right fibula, initial encounter for closed fracture: Secondary | ICD-10-CM

## 2021-06-07 NOTE — Progress Notes (Signed)
Fracture noted on initial visit.

## 2021-06-07 NOTE — ED Provider Notes (Signed)
70 year old female with previous history of tib-fib fracture to right lower extremity, coming in reporting she lost her balance 3 days ago, patient reports that she rolled her right ankle and foot.  Reports that when she had injury she was able to catch herself, patient did not fall to the floor or hit her head.  Reporting no loss of consciousness.  Patient reports swelling to midfoot and lateral aspect of ankle.  Reporting difficulty walking, patient previously had crutches and walking boot due to previous tibial fibular fracture to right lower extremity.  Reports she has been ambulating with walking boot crutches which is helped.  Coming in today for further evaluation concern for fracture.  No numbness or tingling to toes. No other injuries to RLE.  Able to ambulate after injury.           Past Medical History:   Diagnosis Date   ??? Headache    ??? High blood pressure    ??? High cholesterol    ??? Wears glasses         Past Surgical History:   Procedure Laterality Date   ??? HX HYSTERECTOMY  2012   ??? HX LUMBAR DISKECTOMY  1998   ??? HX OTHER SURGICAL      pelvic floor, mesh and raise bladder   ??? IR CHOLECYSTOSTOMY PERCUTANEOUS  1980         Family History   Problem Relation Age of Onset   ??? Hypertension Mother    ??? Diabetes Father    ??? Colon Cancer Sister 71        colostomy   ??? Other Sister         prediabetic   ??? Obesity Sister    ??? No Known Problems Brother    ??? Mastectomy Maternal Grandmother 59        breast cancer   ??? No Known Problems Maternal Grandfather    ??? No Known Problems Paternal Grandmother    ??? Diabetes Paternal Grandfather    ??? OSTEOARTHRITIS Brother 60        2 knee replacement   ??? No Known Problems Brother    ??? Uterine Cancer Neg Hx    ??? Ovarian Cancer Neg Hx         Social History     Socioeconomic History   ??? Marital status: MARRIED     Spouse name: [name]   ??? Number of children: 4   ??? Years of education: ADN   ??? Highest education level: Not on file   Occupational History   ??? Occupation: Retired      Comment: Engineer, civil (consulting)-   Tobacco Use   ??? Smoking status: Current Some Day Smoker     Packs/day: 0.10     Years: 40.00     Pack years: 4.00     Types: Cigarettes   ??? Smokeless tobacco: Never Used   Vaping Use   ??? Vaping Use: Never used   Substance and Sexual Activity   ??? Alcohol use: Yes     Alcohol/week: 1.0 standard drink     Types: 1 Glasses of wine per week     Comment: 1-2 glasses per night   ??? Drug use: Never   ??? Sexual activity: Yes     Partners: Male     Birth control/protection: Surgical   Other Topics Concern   ??? 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  4 children together:    - Sean 1970     -Adam 1972 Grand child- 1 boy    -Carrie 1975 Grand child -1 girl    -[name] [phone] - 3 girls        She does currently smoke cigarettes - 1/2 a pack a week, not every day.    Her husband does smoke cigars.    She does not use illicit drugs.    She does drink wine 1-2 glasses at night.        She enjoys her time with grand children, reading.        Wears a seat belt in a car.             Social Determinants of Health     Financial Resource Strain: Low Risk    ??? Difficulty of Paying Living Expenses: Not hard at all   Food Insecurity: No Food Insecurity   ??? Worried About Programme researcher, broadcasting/film/video in the Last Year: Never true   ??? Ran Out of Food in the Last Year: Never true   Transportation Needs:    ??? Lack of Transportation (Medical): Not on file   ??? Lack of Transportation (Non-Medical): Not on file   Physical Activity:    ??? Days of Exercise per Week: Not on file   ??? Minutes of Exercise per Session: Not on file   Stress:    ??? Feeling of Stress : Not on file   Social Connections:    ??? Frequency of Communication with Friends and Family: Not on file   ??? Frequency of Social Gatherings with Friends and Family: Not on file   ??? Attends Religious Services: Not on file   ??? Active Member of Clubs or Organizations: Not on file   ??? Attends Banker Meetings: Not on file    ??? Marital Status: Not on file   Intimate Partner Violence:    ??? Fear of Current or Ex-Partner: Not on file   ??? Emotionally Abused: Not on file   ??? Physically Abused: Not on file   ??? Sexually Abused: Not on file   Housing Stability:    ??? Unable to Pay for Housing in the Last Year: Not on file   ??? Number of Places Lived in the Last Year: Not on file   ??? Unstable Housing in the Last Year: Not on file                ALLERGIES: Codeine and Lisinopril    Review of Systems   Constitutional: Negative for fever.   HENT: Negative.    Eyes: Negative.    Respiratory: Negative for chest tightness and shortness of breath.    Cardiovascular: Negative.  Negative for chest pain.   Gastrointestinal: Negative.    Genitourinary: Negative.    Musculoskeletal:        Pain, swelling, ecchymosis to right middle foot, right ankle pain and swelling   Skin: Negative for rash.   Neurological: Negative.  Negative for numbness and headaches.   Psychiatric/Behavioral: Negative.        Vitals:    06/07/21 1341   BP: (!) 145/82   Pulse: (!) 54   Resp: 16   Temp: 98.1 ??F (36.7 ??C)   SpO2: 97%       Physical Exam  Vitals and nursing note reviewed.   Constitutional:       General: She is not in acute distress.  Appearance: Normal appearance. She is not ill-appearing, toxic-appearing or diaphoretic.   HENT:      Nose: Nose normal.   Eyes:      Conjunctiva/sclera: Conjunctivae normal.   Cardiovascular:      Rate and Rhythm: Normal rate and regular rhythm.      Pulses: Normal pulses.   Pulmonary:      Effort: Pulmonary effort is normal. No respiratory distress.   Abdominal:      General: There is no distension.   Musculoskeletal:         General: Normal range of motion.      Cervical back: Normal range of motion.      Comments: Swelling local to midfoot, medial and lateral malleolus.  No pitting edema.  Ecchymosis directly distal to lateral malleolus.  Tenderness to lateral and medial malleolus, proximal aspect of 3rd, 4th, 5th metatarsal.   Tenderness to navicular bone.  No other tenderness to right foot, ankle.  Full range of motion to ankle.  Full range of motion to all digits.  Neurovascularly intact distally, less than 2nd capillary refill to all digits.  Plus two radial pulses   Skin:     General: Skin is warm and dry.   Neurological:      General: No focal deficit present.   Psychiatric:         Mood and Affect: Mood normal.         MDM  Number of Diagnoses or Management Options  Closed fracture of distal end of right fibula, unspecified fracture morphology, initial encounter  Multiple closed fractures of right foot, initial encounter  Diagnosis management comments: 70 year old female with previous history of right tibial, fibular fracture three years ago, coming in reporting having injury to right ankle and right foot 3 days ago.  Pain with ambulation.  Able to ambulate after injury.  Denying numbness or tingling to lower extremity at this time.  Did not hit head, no loss of consciousness, patient is not on blood thinners.  Moderate amount of swelling and ecchymosis to right midfoot, directly distal to lateral malleolus.  Tenderness to 3rd, 4th, 5th proximal aspect of metatarsals.  Also tenderness to medial and lateral malleolus, navicular bone.  X-ray of right ankle and foot obtained showing nondisplaced fracture to 3rd and 4th metatarsals, also nondisplaced fracture to distal fibula.  Patient placed in posterior ankle and stirup splint.  Patient has crutches, do not need to provide crutches today.  Please use crutches for ambulating, also nonweightbearing if tolerated, if patient does not feel comfortable with nonweightbearing I am recommending patient to use touchdown technique.  Discussed with patient that I would like patient to feel comfortable with ambulation, do not want to place patient at further risk for injury or falling.  Orthopedic referral placed for patient.  Follow-up with ortho as tolerated.  Tylenol and or ibuprofen for pain  management as lungs patient can take medicines and has no history of kidney or liver disease.  Elevating, rest, activity as tolerated.  Discussed neurovascular compromise, worsening pain, unable to manage pain with OTC medicines reports the ER for further evaluation.  Patient verbalized understanding of plan of care, all questions answered, patient stable at discharge.       Amount and/or Complexity of Data Reviewed  Tests in the radiology section of CPT??: reviewed               Procedures

## 2021-06-07 NOTE — ED Notes (Signed)
Pt states that she fell Thursday night, next day her right foot/ankle was swollen, painful. Has been wearing a walking boot. Did not hit head when she fell.

## 2021-06-07 NOTE — Progress Notes (Signed)
Fractures noted on initial urgent care visit.  Orthopedic referral placed.

## 2021-06-08 ENCOUNTER — Other Ambulatory Visit: Payer: Self-pay | Admitting: Physician Assistant

## 2021-06-08 DIAGNOSIS — I10 Essential (primary) hypertension: Secondary | ICD-10-CM

## 2021-06-09 NOTE — Telephone Encounter (Signed)
Noted thank you

## 2021-06-09 NOTE — Telephone Encounter (Signed)
Called and spoke with patient    Received notification that patient was seen    at [facility] on 06/07/21    Dx ret 3rd & 4th metatarsal fx & rt ankle fx    DOI 06/04/21    Xrays done at [facility]    Has posterior ankle and stirup splint    Patient is scheduled to see Sarah on 06/10/21 @ 10:00 to arrive for 9:45      Cephus Richer

## 2021-06-10 ENCOUNTER — Ambulatory Visit: Attending: Physician Assistant | Primary: Family

## 2021-06-10 ENCOUNTER — Ambulatory Visit
Admit: 2021-06-10 | Discharge: 2021-06-10 | Payer: PRIVATE HEALTH INSURANCE | Attending: Physician Assistant | Primary: Family

## 2021-06-10 ENCOUNTER — Encounter: Primary: Family

## 2021-06-10 DIAGNOSIS — S99101A Unspecified physeal fracture of right metatarsal, initial encounter for closed fracture: Secondary | ICD-10-CM

## 2021-06-10 NOTE — Progress Notes (Signed)
New Patient Visit    Date:  06/10/21    Name: Barbara Elliott  MRN: [id number]      CC:   Chief Complaint   Patient presents with   ??? Foot Pain     NI - Rt ankle, 3rd,4th, 5th met fx         HPI:  Barbara Elliott is a 70 y.o. female who presents after sustaining a right lower extremity inversion type injury on 06/05/2021 when she missed a step climbing into her high bed.  She felt immediate pain in her lateral foot and ankle however thought maybe she had just sprained it.  She was able to bear weight and therefore kept it iced and elevated over the course of the next several days.  He continued to be swollen and bruised as well as painful and therefore went to the urgent care on 06/07/2021 where x-rays were obtained.  Diagnosis was a metatarsal base fracture of the 3rd and 4th metatarsals, as well as a nondisplaced fracture of the distal fibula.  She was placed in a posterior and stirrup nonweightbearing splint with crutches and instructed to be nonweightbearing, rest and elevate her foot and follow-up with orthopedics.  She is doing well reports improved discomfort.  Denies distal numbness or tingling or motor changes.  Note is that she also has chronic left hip pain and will follow up with Dr.Kovalenko after having an MRI next week.  Unfortunately this right foot injury is forcing her to bear more weight on her left leg increasing her left hip pain.  She is fortunately able to endure it.  She does take vitamin-D and has her levels taken on a regular basis.  Chief Complaint   Patient presents with   ??? Foot Pain     NI - Rt ankle, 3rd,4th, 5th met fx       PMH:   Past Medical History:   Diagnosis Date   ??? Headache    ??? High blood pressure    ??? High cholesterol    ??? Wears glasses        PSH:   Past Surgical History:   Procedure Laterality Date   ??? HX HYSTERECTOMY  2012   ??? HX LUMBAR DISKECTOMY  1998   ??? HX OTHER SURGICAL      pelvic floor, mesh and raise bladder   ??? IR CHOLECYSTOSTOMY PERCUTANEOUS  1980        Social History:   Social History     Socioeconomic History   ??? Marital status: MARRIED     Spouse name: Collier [place]   ??? Number of children: 4   ??? Years of education: ADN   ??? Highest education level: Not on file   Occupational History   ??? Occupation: Retired     Comment: Marine scientist-   Tobacco Use   ??? Smoking status: Current Some Day Smoker     Packs/day: 0.10     Years: 40.00     Pack years: 4.00     Types: Cigarettes   ??? Smokeless tobacco: Never Used   Vaping Use   ??? Vaping Use: Never used   Substance and Sexual Activity   ??? Alcohol use: Yes     Alcohol/week: 1.0 standard drink     Types: 1 Glasses of wine per week     Comment: 1-2 glasses per night   ??? Drug use: Never   ??? Sexual activity: Yes     Partners: Male  Birth control/protection: Surgical   Other Topics Concern   ??? 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    4 children together:    - [place] child- 1 boy    -[place] child -1 girl    -Benjamine Mola ([place]) [phone] - 3 girls        She does currently smoke cigarettes - 1/2 a pack a week, not every day.    Her husband does smoke cigars.    She does not use illicit drugs.    She does drink wine 1-2 glasses at night.        She enjoys her time with grand children, reading.        Wears a seat belt in a car.             Social Determinants of Health     Financial Resource Strain: Low Risk    ??? Difficulty of Paying Living Expenses: Not hard at all   Food Insecurity: No Food Insecurity   ??? Worried About Charity fundraiser in the Last Year: Never true   ??? Ran Out of Food in the Last Year: Never true   Transportation Needs:    ??? Lack of Transportation (Medical): Not on file   ??? Lack of Transportation (Non-Medical): Not on file   Physical Activity:    ??? Days of Exercise per Week: Not on file   ??? Minutes of Exercise per Session: Not on file   Stress:    ??? Feeling of Stress : Not on file   Social Connections:    ??? Frequency of Communication with  Friends and Family: Not on file   ??? Frequency of Social Gatherings with Friends and Family: Not on file   ??? Attends Religious Services: Not on file   ??? Active Member of Clubs or Organizations: Not on file   ??? Attends Archivist Meetings: Not on file   ??? Marital Status: Not on file   Intimate Partner Violence:    ??? Fear of Current or Ex-Partner: Not on file   ??? Emotionally Abused: Not on file   ??? Physically Abused: Not on file   ??? Sexually Abused: Not on file   Housing Stability:    ??? Unable to Pay for Housing in the Last Year: Not on file   ??? Number of Places Lived in the Last Year: Not on file   ??? Unstable Housing in the Last Year: Not on file       Allergies:   Allergies   Allergen Reactions   ??? Codeine Shortness of Breath   ??? Lisinopril Cough       Medications:   Prior to Admission medications    Medication Sig Start Date End Date Taking? Authorizing Provider   glucosamine-chondroitin (ARTHX) 500-400 mg cap Take 1 Capsule by mouth daily.   Yes Provider, Historical   acetaminophen (TylenoL) 325 mg tablet Take  by mouth every four (4) hours as needed for Pain.   Yes Provider, Historical   ibuprofen (MOTRIN) 600 mg tablet Take  by mouth every six (6) hours as needed for Pain.   Yes Provider, Historical   metoprolol succinate (TOPROL-XL) 25 mg XL tablet Take 1 Tablet by mouth daily. Indications: high blood pressure 05/27/21  Yes Rousseau, Amy E, FNP   amLODIPine (NORVASC) 10 mg tablet Take 1  Tablet by mouth daily. 03/12/21  Yes Trofimovitch, Beverlee Nims, MD   atorvastatin (LIPITOR) 40 mg tablet Take 1 Tablet by mouth daily. 01/02/21  Yes Provider, Historical   multivitamin (ONE A DAY) tablet Take 1 Tablet by mouth daily.   Yes Provider, Historical   cholecalciferol, vitamin D3, (Vitamin D3) 50 mcg (2,000 unit) tab Take 1 Tablet by mouth daily.   Yes Provider, Historical   calcium carbonate (CALTREX) 600 mg calcium (1,500 mg) tablet Take 600 mg by mouth two (2) times a day.   Yes Provider, Historical   Shingrix, PF,  50 mcg/0.5 mL susr injection Administer 0.24m IM injection once now and a second injection in 2-6 months  Indications: shingles vaccination  Patient not taking: Reported on 05/27/2021 04/01/21   RHervey Ard FNP       Problem List:   Patient Active Problem List   Diagnosis Code   ??? Encounter for hepatitis C screening test for low risk patient Z11.59   ??? Preventative health care Z00.00   ??? 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 Hypertension I10   ??? Prediabetes R73.03   ??? Current every day smoker F17.200   ??? Left hip pain M25.552   ??? Encounter for immunization Z23   ??? Screening mammogram, encounter for Z12.31   ??? Encounter for subsequent annual wellness visit (AWV) in Medicare patient Z00.00   ??? Vitamin D deficiency E55.9         ROS  General: no fevers, chills, night sweats  Psychiatric: no suicidal ideation, anxiety  Neurologic: no headaches, loss of consciousness or change in vision  Cardiovascular: no chest pain  Respiratory: no shortness of breath  GI: no abdominal pain, no ulcers  Skin: no infections, no rashes  Hematologic: no bleeding disorders, history of blood clots  Endocrine: No diabetes mellitus  Musckuloskeletal:  See above      PHYSICAL EXAM:  There were no vitals taken for this visit.  _0 @        There is no height or weight on file to calculate BMI.    General:  The patient is a well-appearing 70y.o. female who appears her stated age. she is alert and oriented x3, pleasant, conversive, interactive and appropriate.        Gait Abnormalities:  She remains nonweightbearing on the right lower extremity    Inspection:   No gross abnormalities, malrotation or malalignment noted of the right foot or ankle    Right Lower Extremity Exam:    Skin:  Intact, no abrasions or open wounds.  Swelling: moderate  Sensory: 2/2 to light touch in the deep peroneal, superficial peroneal, sural, saphenous, tibial    Vascular:  normal DP and PT pulses, no trophic changes or ulcerative lesions and  normal sensory exam    Motor: 5/5 strength to dorsiflexion, plantarflexion, inversion, eversion and great toe flexion and extension.    Palpation:   Tenderness to palpation over the right dorsal foot specifically over the base of the 2nd 3rd and 4th metatarsals.  Also mom tenderness over the distal fibula.  No tenderness over the base of the 5th metatarsal.  Achilles tendon intact and nontender.  No medial malleolus tenderness.  Negative drawer test.          IMAGING:  No new images obtained today however images of the right ankle and foot from 06/07/21 were reviewed and discussed with the patient.  A question of a fracture at the base  of the 2nd metatarsal as well.    Radiology impression  EXAM: ANKLE, RIGHT 3 VIEWS  ??  INDICATION: Lateral medial malleolus tenderness;  ??  COMPARISON: None.  ??  TECHNIQUE: Three views of the right ankle were obtained.  ??  FINDINGS: There are linear lucencies and subtle cortical irregularity involving the distal fibular metaphysis, compatible with nondisplaced fracture. The visualized tibia appears intact. The talar dome appears intact and there is normal mortise joint alignment. There is a small chronic-appearing calcification adjacent to the medial malleolus. There are small plantar calcaneal spurs.  ??  IMPRESSION: Acute nondisplaced fracture of the distal fibula.  ??  EXAM: FOOT, RIGHT 3 VIEWS  ??  INDICATION: 1st to 5th proximal aspect metatarsal tenderness;  ??  COMPARISON: None.  ??  TECHNIQUE: Three views of the right foot were obtained.  ??  FINDINGS: There is linear lucency traversing the proximal right third and fourth metatarsals. There is also a fracture of the distal fibula, as described on separately dictated right ankle radiograph. There is normal osseous alignment.    ??  IMPRESSION: Nondisplaced fractures traversing the proximal right third and fourth metatarsals. Also nondisplaced fracture of the distal fibula.  ??    IMPRESSION:  Nondisplaced fractures of the right foot  including the 3rd and 4th metatarsal bases, possible 2nd metatarsal as well.  Nondisplaced distal fibular fracture of the right ankle    PLAN:  Today we discussed the diagnosis and treatment options.  These fracture should heal well with and we will place her in a well-fitting tall Cam boot.  She may partially weight bear using crutches for support and pain in her guide.  She should keep these Cam boot on at all times including at night.  Healing time varies but is at least 6-8 weeks.  None appear surgical at this time.  Rest ice and elevate the leg as much as possible and follow-up here for repeat exam and repeat right foot and ankle x-rays x3.  Patient understands the diagnosis, treatment recommendations and agrees with this plan.  She will call with any questions or concerns prior to next appointment.

## 2021-06-17 ENCOUNTER — Encounter: Payer: PRIVATE HEALTH INSURANCE | Primary: Family

## 2021-06-20 ENCOUNTER — Inpatient Hospital Stay: Admit: 2021-06-20 | Payer: PRIVATE HEALTH INSURANCE | Attending: Orthopaedic Surgery | Primary: Family

## 2021-06-20 DIAGNOSIS — M8700 Idiopathic aseptic necrosis of unspecified bone: Secondary | ICD-10-CM

## 2021-06-23 ENCOUNTER — Other Ambulatory Visit: Payer: Self-pay | Admitting: Physician Assistant

## 2021-06-23 ENCOUNTER — Ambulatory Visit: Attending: Orthopaedic Surgery | Primary: Family

## 2021-06-23 ENCOUNTER — Ambulatory Visit
Admit: 2021-06-23 | Discharge: 2021-06-23 | Payer: PRIVATE HEALTH INSURANCE | Attending: Orthopaedic Surgery | Primary: Family

## 2021-06-23 DIAGNOSIS — M1612 Unilateral primary osteoarthritis, left hip: Secondary | ICD-10-CM

## 2021-06-23 DIAGNOSIS — E039 Hypothyroidism, unspecified: Secondary | ICD-10-CM

## 2021-06-23 DIAGNOSIS — R6889 Other general symptoms and signs: Secondary | ICD-10-CM

## 2021-06-23 DIAGNOSIS — E782 Mixed hyperlipidemia: Secondary | ICD-10-CM

## 2021-06-23 DIAGNOSIS — E781 Pure hyperglyceridemia: Secondary | ICD-10-CM

## 2021-06-23 NOTE — Progress Notes (Signed)
CC: left  hip pain followup    HPI:   From 04/11/21 - "The patient is a pleasant 70 y.o. year-old F who presents today for evaluation of left hip pain.  The hip has been painful for years. She has no Hx of trauma or surgery of the hip.  The pain has become increasingly severe over the past 6 months. The pain started in groin and is now moving towards lateral aspect of the hip. The pain occasionally radiates down the thigh typically when she wakes up.  The patient describes the pain as dull and aching in nature.  The pain is constant, and occasionally there are severe exacerbations of the pain.  The pain is exacerbated by prolonged standing and sudden movements.  From a functional standpoint, the patient has difficulty navigating stairs reciprocally and walking on uneven surfaces. She reports a worsening in her uneven gait.  The patient has tried conservative measures including Tylenol and physical therapy. They noticed improvement with physical therapy. Despite these treatments for greater than 6 months, the pain persists. The pain has become severe enough to interfere with activities of daily living.     She denies any extended steroid use, hx of heavy drinking, exposure to radiation, deep sea diving or FMHx of sickle cell. She is an occasional smoker, when she drinks wine. She has some days without smoking."    She returns today for follow-up after having completed her MRI of the left hip. She foot pain after breaking her foot while getting into bed 2 weeks ago. For a couple weeks prior she was experiencing burning radiating down the right leg. The pain was exacerbated after the injury to her foot.  She has not yet started physical therapy, as she was waiting clearance to begin full weight-bearing.    Past Medical History:   Diagnosis Date   ??? Headache    ??? High blood pressure    ??? High cholesterol    ??? Wears glasses        Current Outpatient Medications:   ???  glucosamine-chondroitin (ARTHX) 500-400 mg cap, Take 1  Capsule by mouth daily., Disp: , Rfl:   ???  acetaminophen (TylenoL) 325 mg tablet, Take  by mouth every four (4) hours as needed for Pain., Disp: , Rfl:   ???  ibuprofen (MOTRIN) 600 mg tablet, Take  by mouth every six (6) hours as needed for Pain., Disp: , Rfl:   ???  metoprolol succinate (TOPROL-XL) 25 mg XL tablet, Take 1 Tablet by mouth daily. Indications: high blood pressure, Disp: 30 Tablet, Rfl: 1  ???  Shingrix, PF, 50 mcg/0.5 mL susr injection, Administer 0.54ml IM injection once now and a second injection in 2-6 months  Indications: shingles vaccination (Patient not taking: Reported on 05/27/2021), Disp: 0.5 mL, Rfl: 1  ???  amLODIPine (NORVASC) 10 mg tablet, Take 1 Tablet by mouth daily., Disp: 90 Tablet, Rfl: 3  ???  atorvastatin (LIPITOR) 40 mg tablet, Take 1 Tablet by mouth daily., Disp: , Rfl:   ???  multivitamin (ONE A DAY) tablet, Take 1 Tablet by mouth daily., Disp: , Rfl:   ???  cholecalciferol, vitamin D3, (Vitamin D3) 50 mcg (2,000 unit) tab, Take 1 Tablet by mouth daily., Disp: , Rfl:   ???  calcium carbonate (CALTREX) 600 mg calcium (1,500 mg) tablet, Take 600 mg by mouth two (2) times a day., Disp: , Rfl:   Allergies   Allergen Reactions   ??? Codeine Shortness of Breath   ???  Lisinopril Cough     Past Surgical History:   Procedure Laterality Date   ??? HX HYSTERECTOMY  2012   ??? HX LUMBAR DISKECTOMY  1998   ??? HX OTHER SURGICAL      pelvic floor, mesh and raise bladder   ??? IR CHOLECYSTOSTOMY PERCUTANEOUS  1980     Family History   Problem Relation Age of Onset   ??? Hypertension Mother    ??? Diabetes Father    ??? Colon Cancer Sister 14        colostomy   ??? Other Sister         prediabetic   ??? Obesity Sister    ??? No Known Problems Brother    ??? Mastectomy Maternal Grandmother 10        breast cancer   ??? No Known Problems Maternal Grandfather    ??? No Known Problems Paternal Grandmother    ??? Diabetes Paternal Grandfather    ??? OSTEOARTHRITIS Brother 60        2 knee replacement   ??? No Known Problems Brother    ??? Uterine Cancer  Neg Hx    ??? Ovarian Cancer Neg Hx      Social History     Socioeconomic History   ??? Marital status: MARRIED     Spouse name: [name]   ??? Number of children: 4   ??? Years of education: ADN   ??? Highest education level: Not on file   Occupational History   ??? Occupation: Retired     Comment: Engineer, civil (consulting)-   Tobacco Use   ??? Smoking status: Current Some Day Smoker     Packs/day: 0.10     Years: 40.00     Pack years: 4.00     Types: Cigarettes   ??? Smokeless tobacco: Never Used   Vaping Use   ??? Vaping Use: Never used   Substance and Sexual Activity   ??? Alcohol use: Yes     Alcohol/week: 1.0 standard drink     Types: 1 Glasses of wine per week     Comment: 1-2 glasses per night   ??? Drug use: Never   ??? Sexual activity: Yes     Partners: Male     Birth control/protection: Surgical   Other Topics Concern   ??? 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    4 children together:    - Sean 1970     -Adam 1972 Grand child- 1 boy    -Lyla Son 1975 Grand child -1 girl    -[name] [phone] - 3 girls        She does currently smoke cigarettes - 1/2 a pack a week, not every day.    Her husband does smoke cigars.    She does not use illicit drugs.    She does drink wine 1-2 glasses at night.        She enjoys her time with grand children, reading.        Wears a seat belt in a car.             Social Determinants of Health     Financial Resource Strain: Low Risk    ??? Difficulty of Paying Living Expenses: Not hard at all   Food Insecurity: No Food Insecurity   ??? Worried About Running Out of Food in the Last Year: Never true   ??? Ran Out of  Food in the Last Year: Never true   Transportation Needs:    ??? Lack of Transportation (Medical): Not on file   ??? Lack of Transportation (Non-Medical): Not on file   Physical Activity:    ??? Days of Exercise per Week: Not on file   ??? Minutes of Exercise per Session: Not on file   Stress:    ??? Feeling of Stress : Not on file   Social Connections:    ??? Frequency of  Communication with Friends and Family: Not on file   ??? Frequency of Social Gatherings with Friends and Family: Not on file   ??? Attends Religious Services: Not on file   ??? Active Member of Clubs or Organizations: Not on file   ??? Attends Banker Meetings: Not on file   ??? Marital Status: Not on file   Intimate Partner Violence:    ??? Fear of Current or Ex-Partner: Not on file   ??? Emotionally Abused: Not on file   ??? Physically Abused: Not on file   ??? Sexually Abused: Not on file   Housing Stability:    ??? Unable to Pay for Housing in the Last Year: Not on file   ??? Number of Places Lived in the Last Year: Not on file   ??? Unstable Housing in the Last Year: Not on file     Examination: There were no vitals taken for this visit.  The patient is awake, alert and appropriate.  The head is normocephalic.  The sclerae are white.  The skin has normal turgor.  The abdomen is non-protruded.  The chest expands normally with deep inspiration.  The cardiac rhythm is regular when the pulse is palpated at the wrist.    Focused examination of the right hip reveals normal-appearing skin of the trochanter.  The trochanter is nontender.  The seated position rotational motion of the right hip is not painful.  Hip flexion strength is normal.  Abduction strength is normal.  In the supine position, the hip flexes to 100 degrees abducts 30 degrees and adducts 10 degrees.  There is a negative Stinchfield test.  There is a negative anterior impingement test.     Focused examination of the left hip reveals normal-appearing skin of the trochanter.  The trochanter is nontender.  The seated position rotational motion of the left hip is painful.  This is worse with internal rotation.  Hip flexion strength is full.  Abduction strength is full. Adduction strength is full.  In the supine position, the hip flexes to 100 degrees abducts 30 degrees and adducts 15 degrees.    Imaging:  MRI of the left hip from June 20, 2021 was independently  reviewed at the demonstrates full-thickness cartilage loss superiorly, with supra-acetabular cyst formation.  There are also degenerative changes to the right hip albeit to a lesser degree than the left.  There is degeneration of the labrum.  No evidence of avascular necrosis.    Assessment: 70 y.o. F with bilateral hip osteoarthritis.    Plan:   I have discussed the diagnosis with the patient.  We have discussed the natural history of hip joint arthrosis. We have discussed both non-surgical and surgical treatment options; these include physical therapy, intraarticular injection, and potentially total hip arthroplasty. Despite treatment with conservative measures, the patient reports ongoing symptoms that interfere with quality of life and desired activities.  As we have ruled out avascular necrosis as the etiology, I feel safe proceeding with intra-articular injection.  We will place referral to rheumatology for ultrasound guided CSI. We will hold off physical therapy to wait on clearance to apply weight on her foot. I offered referral to physiatry for her sciatica, but she is not interested, due to previous adverse reaction to spinal injection.    The patient understands the plan and will follow up as needed.    This chart was dictated using M Modal, a voice to text software; inherent to using this technology, there may be incorrectly spelled words, or erroneous phrases. Please call the office with any questions or concerns.    Scribe Attestation:   June 23, 2021 at 10:59 AM - [place] AprilAbigail S Nongo scribing for and in the presence of Dr.Harley Fitzwater Shirley MuscatKovalenko    Abigail S Nongo, Editor, commissioningcribe    Provider Attestation.  I was present with Patient and was seen examined and evaluated by me 06/23/2021 11:24 AM and I agree with the documentation by scribe.

## 2021-06-24 MED ORDER — METOPROLOL SUCCINATE SR 25 MG 24 HR TAB
25 mg | ORAL_TABLET | ORAL | 1 refills | Status: DC
Start: 2021-06-24 — End: 2021-07-21

## 2021-06-27 ENCOUNTER — Encounter

## 2021-07-01 ENCOUNTER — Ambulatory Visit: Attending: Student in an Organized Health Care Education/Training Program | Primary: Family

## 2021-07-01 ENCOUNTER — Inpatient Hospital Stay: Admit: 2021-07-01 | Discharge: 2021-07-01 | Payer: PRIVATE HEALTH INSURANCE | Primary: Family

## 2021-07-01 ENCOUNTER — Ambulatory Visit
Admit: 2021-07-01 | Discharge: 2021-07-01 | Payer: PRIVATE HEALTH INSURANCE | Attending: Student in an Organized Health Care Education/Training Program | Primary: Family

## 2021-07-01 ENCOUNTER — Inpatient Hospital Stay
Admit: 2021-07-01 | Discharge: 2021-07-01 | Payer: PRIVATE HEALTH INSURANCE | Attending: Student in an Organized Health Care Education/Training Program | Primary: Family

## 2021-07-01 DIAGNOSIS — S99101A Unspecified physeal fracture of right metatarsal, initial encounter for closed fracture: Secondary | ICD-10-CM

## 2021-07-01 DIAGNOSIS — S82831A Other fracture of upper and lower end of right fibula, initial encounter for closed fracture: Secondary | ICD-10-CM

## 2021-07-01 NOTE — Progress Notes (Signed)
CC: F/U - right nondisplaced distal fibula fracture, right nondisplaced 3rd and 4th proximal metatarsal fractures  Original Injury was 06/05/21 when she missed a step climbing into bed resulting in an inversion injury to right ankle and foot.  Barbara Elliott, 70 y.o., female, presents to clinic today for f/u of right ankle and foot fractures  Patient states that pain is 2/10 and well managed  They have been in a boot since the last appointment, nonweight bearing with crutches. Patient states she has beared weight at times.  They have no new complaints or concerns.  Denies any peripheral numbness or tingling.  Swelling is minimal, no bruising.  ??  GENERAL: Alert, well-developed, well-nourished, in no acute distress.  HEAD: atraumatic, normocephalic  EYES: no eye discharge or injection  NOSE: No rhinorrhea or discharge noted.  SKIN: Ecchymosis, No erythema, warmth, fluctuance or induration.  MUSCULO: Examination of bilateral extremities reveals neurovascularly intact limbs. Skin is cool, dry, and intact. There is mild swelling of right ankle and foot. There is mild tenderness to palpation of distal fibula and base of 3rd and 4th metatarsals. There is range of motion of the knee, hip, and all toes in all planes.  No ecchymosis.     Imaging:  Right ankle: 2-view weightbearing xrays were taken today, mortise and lateral. Images were independently visualized and interpreted by myself.  No soft tissue or bony masses are noted.  There is no soft tissue swelling.  There is an oblique distal fibula fracture, nondisplaced with fracture line lucency more apparent in comparison to last xrays. No subluxations or dislocations.  There is anatomic alignment of the ankle mortise and syndesmosis. Spurring of the posterior talus.    Right foot: 2-view weightbearing xrays were taken today, A/P, Oblique, and Lateral views. These images were independently viewed and interpreted by myself.  No soft tissue or bony masses are noted.  There is  no soft tissue swelling.  There are fractures of the 3rd and 4th proximal metatarsals, nondisplaced.No subluxations or dislocations.      Assessment: Stable right, nondisplaced 3rd and 4th proximal metatarsal fractures, nondisplaced distal fibula fracture  ??  Plan:   Return to clinic in 3 weeks  X-rays next appointment, right foot and ankle needed  Nonweight bearing due to distal fibular fracture in a boot, crutches were not properly fitted (too short) so a new set of crutches were given and fitted to patient.  Continue to elevate and use Tylenol for pain management as needed  Activities are limited due to nonweight bearing status  Is currently taking Vitamin D and calcium  Discussed with her to not be doing any impact activities or sports

## 2021-07-16 NOTE — Telephone Encounter (Signed)
Telephone Encounter by Creola Corn E at 07/16/21 1132                Author: Creola Corn E  Service: --  Author Type: --       Filed: 07/16/21 1133  Encounter Date: 07/16/2021  Status: Signed          Editor: Creola Corn E                 seau, Amy E, FNP    You 38 minutes ago (10:52 AM)             Blood pressure was normal at last OV note- Urgent care was elevated- please bring in for Nurse visit and reevaluate.   Thank you,   Norm Parcel,  FNP

## 2021-07-16 NOTE — Telephone Encounter (Signed)
Called and spoke with Pt.  Scheduled NV for BP check for 07/21/21 at 11:00 am.    Review and consult with back office PSR for medical records, reveals records have not been rec'd yet.  ROI printed and faxed as second medical records request, with a note for urgent request for colonoscopy report.    Thank you,  Beverlee Nims

## 2021-07-16 NOTE — Telephone Encounter (Signed)
Pt on GAP list for HTN and colorectal screening.     BP Readings from Last 3 Encounters:   06/07/21 (!) 145/82   05/27/21 138/76   05/13/21 134/76     Future Appointments   Date Time Provider Department Center   07/24/2021 11:20 AM Tawni Carnes, PA ORTHO [facility] CFO   09/30/2021 11:00 AM Val Eagle Janalee Dane, DO RHEUM [facility]   04/02/2022  9:00 AM Mylinda Latina, FNP L[facility]            Please advise if you want to schedule a NV for check BP?   Signed By: Midge Aver     July 16, 2021                     Still pending records.Please request records? Need colonoscopy report

## 2021-07-21 ENCOUNTER — Institutional Professional Consult (permissible substitution): Admit: 2021-07-21 | Discharge: 2021-07-21 | Payer: PRIVATE HEALTH INSURANCE | Primary: Family

## 2021-07-21 DIAGNOSIS — I10 Essential (primary) hypertension: Secondary | ICD-10-CM

## 2021-07-21 MED ORDER — METOPROLOL SUCCINATE SR 25 MG 24 HR TAB
25 mg | ORAL_TABLET | ORAL | 1 refills | Status: DC
Start: 2021-07-21 — End: 2021-08-14

## 2021-07-21 NOTE — Progress Notes (Signed)
Pt presented to office for BP check. BP 130 72 in right arm 02-98 % PR- 71. PT denied any symptoms. Pt left with  No questions or concerns.

## 2021-07-22 ENCOUNTER — Ambulatory Visit: Attending: Internal Medicine | Primary: Family

## 2021-07-22 ENCOUNTER — Encounter

## 2021-07-22 ENCOUNTER — Ambulatory Visit
Admit: 2021-07-22 | Discharge: 2021-07-22 | Payer: PRIVATE HEALTH INSURANCE | Attending: Internal Medicine | Primary: Family

## 2021-07-22 DIAGNOSIS — M1991 Primary osteoarthritis, unspecified site: Secondary | ICD-10-CM

## 2021-07-22 MED ORDER — TRIAMCINOLONE ACETONIDE 40 MG/ML SUSP FOR INJECTION
40 mg/mL | Freq: Once | INTRAMUSCULAR | Status: AC
Start: 2021-07-22 — End: 2021-07-22
  Administered 2021-07-22: 18:00:00 via INTRA_ARTICULAR

## 2021-07-22 NOTE — Progress Notes (Signed)
Progress Notes by Grant Ruts Connell, Fredonia Casalino W, DO at 07/22/21 1300                Author: Gershon Crane Connell, Henri MedalErik W, DO  Service: --  Author Type: Physician       Filed: 07/22/21 1618  Encounter Date: 07/22/2021  Status: Signed          Editor: Grant Ruts Connell, Christyl Osentoski W, DO (Physician)                       [facility]   [address]   [place] [id number]   Dept: [phone]            Rheumatology Initial Consultation Note   Name: Barbara Elliott    DOB: 07/21/1951    MRN: [id number]    Date of Service: 07/22/2021 at 1:48 PM       PCP: Mylinda Latinaousseau, Amy E, FNP    Consultation Requested By: Orthopedic Surgery   Reason for Consultation: Ultrasound Guided Hip Joint Injection   Chief Complaint: "Hip Pain"      SUBJECTIVE:   History of Present Illness:    Barbara ParmaJeanne Elliott is a 70 y.o. female. She is a retired Charity fundraiserN, she worked in Data processing managergeriatrics.       History is notable for spinal disc disease with two procedures in the past. She also has HTN, HLD, and Osteoarthritis. Surgical, Family, Social histories and allergies reviewed and not contributory.       She has at least three years of progressive discomfort with use of the left hip radiating to the groin. Exertional in nature. Better with rest. Typical for Osteoarthritis. Seen by orthopedics. Referred for ultrasound guided CSI.       About six weeks ago, she tripped over her dog while getting into bed and sustained metatarsal fracture of the right foot. She is in a boot. She is using crutches. She also endorses R sided sciatica. She does have a piriformis type of pattern to this discomfort.       Past Medical History:      Past Medical History:        Diagnosis  Date         ?  Headache       ?  High blood pressure       ?  High cholesterol           ?  Wears glasses                Past Surgical History:      Past Surgical History:         Procedure  Laterality  Date          ?  HX HYSTERECTOMY    2012     ?  HX LUMBAR DISKECTOMY    1998     ?  HX OTHER SURGICAL              pelvic  floor, mesh and raise bladder          ?  IR CHOLECYSTOSTOMY PERCUTANEOUS    1980              Allergies:    Codeine and Lisinopril         Family History:      Family History         Problem  Relation  Age of Onset          ?  Hypertension  Mother       ?  Diabetes  Father       ?  Colon Cancer  Sister  30              colostomy          ?  Other  Sister                prediabetic          ?  Obesity  Sister       ?  No Known Problems  Brother       ?  Mastectomy  Maternal Grandmother  66              breast cancer          ?  No Known Problems  Maternal Grandfather       ?  No Known Problems  Paternal Grandmother       ?  Diabetes  Paternal Grandfather       ?  OSTEOARTHRITIS  Brother  60              2 knee replacement          ?  No Known Problems  Brother       ?  Uterine Cancer  Neg Hx            ?  Ovarian Cancer  Neg Hx                Medications:     Current Outpatient Medications          Medication  Sig  Dispense  Refill           ?  aspirin 81 mg chewable tablet  Take 81 mg by mouth in the morning.         ?  metoprolol succinate (TOPROL-XL) 25 mg XL tablet  TAKE 1 TABLET BY MOUTH EVERY DAY FOR BLOOD PRESSURE  30 Tablet  1     ?  glucosamine-chondroitin (ARTHX) 500-400 mg cap  Take 1 Capsule by mouth daily.         ?  acetaminophen (TYLENOL) 325 mg tablet  Take  by mouth every four (4) hours as needed for Pain.         ?  ibuprofen (MOTRIN) 600 mg tablet  Take  by mouth every six (6) hours as needed for Pain.               ?  Shingrix, PF, 50 mcg/0.5 mL susr injection  Administer 0.27ml IM injection once now and a second injection in 2-6 months  Indications: shingles vaccination  0.5 mL  1           ?  amLODIPine (NORVASC) 10 mg tablet  Take 1 Tablet by mouth daily.  90 Tablet  3     ?  atorvastatin (LIPITOR) 40 mg tablet  Take 1 Tablet by mouth daily.         ?  multivitamin (ONE A DAY) tablet  Take 1 Tablet by mouth daily.         ?  cholecalciferol, vitamin D3, 50 mcg (2,000 unit) tab  Take 1 Tablet by  mouth daily.               ?  calcium carbonate (CALTREX) 600 mg calcium (1,500 mg) tablet  Take 600 mg by mouth two (2) times a day.  Current Facility-Administered Medications             Medication  Dose  Route  Frequency  Provider  Last Rate  Last Admin              ?  triamcinolone acetonide (KENALOG-40) 40 mg/mL injection 40 mg   40 mg  Intra artICUlar  ONCE  Grant Ruts, DO                    Social History:      Social History          Socioeconomic History         ?  Marital status:  MARRIED              Spouse name:  Barbara Elliott         ?  Number of children:  4     ?  Years of education:  ADN     ?  Highest education level:  Not on file       Occupational History         ?  Occupation:  Retired             Comment: Engineer, civil (consulting)-       Tobacco Use         ?  Smoking status:  Some Days              Packs/day:  0.25         Years:  40.00         Pack years:  10.00         Types:  Cigarettes         ?  Smokeless tobacco:  Never        ?  Tobacco comments:             2 a day       Vaping Use         ?  Vaping Use:  Never used       Substance and Sexual Activity         ?  Alcohol use:  Yes              Alcohol/week:  1.0 standard drink         Types:  1 Glasses of wine per week             Comment: 1-2 glasses per night         ?  Drug use:  Never     ?  Sexual activity:  Yes              Partners:  Male         Birth control/protection:  Surgical        Other Topics  Concern        ?  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 Married to Barbara Elliott.       4 children together:       - Barbara Elliott 1970        -Barbara Elliott 1972 Grand child- 1 boy       -Barbara Elliott 1975 Grand child -1 girl       -Barbara Manis (Almyra Free) [phone] - 3 girls              She does currently smoke cigarettes - 1/2  a pack a week, not every day.       Her husband does smoke cigars.       She does not use illicit drugs.       She does drink wine 1-2 glasses at night.              She enjoys her time with grand children,  reading.              Wears a seat belt in a car.                           Social Determinants of Health          Financial Resource Strain: Low Risk         ?  Difficulty of Paying Living Expenses: Not hard at all       Food Insecurity: No Food Insecurity        ?  Worried About Programme researcher, broadcasting/film/video in the Last Year: Never true     ?  Ran Out of Food in the Last Year: Never true       Transportation Needs: Not on file     Physical Activity: Not on file     Stress: Not on file     Social Connections: Not on file     Intimate Partner Violence: Not on file       Housing Stability: Not on file              Review of Systems:   Review of Systems    Musculoskeletal:  Positive for joint pain.    All other systems reviewed and are negative.      OBJECTIVE:   Physical Exam:   Visit Vitals      BP  (!) 135/92 (BP 1 Location: Right arm, BP Patient Position: Sitting, BP Cuff Size: Adult)        Pulse  86            Physical Exam   Vitals reviewed.     HENT:       Head: Normocephalic and atraumatic.    Eyes:       Pupils: Pupils are equal, round, and reactive to light.     Cardiovascular:       Rate and Rhythm: Normal rate and regular rhythm.       Pulses: Normal pulses.    Pulmonary:       Effort: Pulmonary effort is normal.       Breath sounds: Normal breath sounds.     Musculoskeletal:          General: Tenderness present. Normal range of motion.       Comments: RIGHT foot in boot.    LEFT hip with pain on FADIR.    Right low back discomfort with FABER and also groin discomfort with FADIR   Straight leg raise is equivocal.     Skin:      General: Skin is warm and dry.    Neurological:       Mental Status: She is alert.           RAPID 3 Scores   RAPID 3 Cumulative Score: 21.7   Weighed RAPID 3 Score: 7.2                Studies (Labs, Images, etc.): Reviewed.    Reviewed. Moderate to severe Osteoarthritis at the left hip joint.  Mild to moderate Osteoarthritis of the right hip joint.       ASSESSMENT:      Leanna Hamid is  a 70 y.o. female       History is suggestive of OA. Exam confirms OA. Investigation reveals OA. Diagnosis Osteoarthritis of the hip joints LEFT worse than Right. Ultrasound guided injection today. Return in about two weeks for the other side.       PLAN:     Orders Placed This Encounter        ?  20611 -DRAIN/INJECT LARGE JOINT/BURSA WITH [place]        ?  triamcinolone acetonide (KENALOG-40) 40 mg/mL injection 40 mg           Follow-up and Dispositions      ??  Return in about 2 weeks (around 08/05/2021) for RIGHT HIP JOINT INJECTION.                71 minutes of face-to-face time was spent with the patient with >50% in counseling and coordinating care.       *A copy of this consultation report will be sent to the referring provider and primary care provider.       This document was prepared in part using voice transcription software. Errors may occur and if there are questions regarding the content of this document, please contact the author.

## 2021-07-22 NOTE — Progress Notes (Signed)
Progress Notes by Grant Ruts, DO at 07/22/21 1300                Author: Gershon Crane, Henri Medal, DO  Service: --  Author Type: Physician       Filed: 07/22/21 1618  Encounter Date: 07/22/2021  Status: Signed          Editor: Grant Ruts, DO (Physician)                       [facility]   [address]   [place] [address]   Dept: [phone]         Ultrasound Report:   Exam Date/Time: 07/22/2021 1:42 PM    Patient Name: Barbara Elliott    Patient DOB: 05-20-1951    Patient MRN: [id number]    Equipment / Probe: Butterfly iQ+       Exam:    CPT Code:    20611 Arthrocentesis Major joint (knee, hip, shoulder)}   MOD: 26    MOD: TC      Indication:   M16.1  Hip OA   M25.552 LEFT Hip Pain      Protocol used:    Kissin, E. USSONAR Protocols. [place]. 2017    Talbert Cage. Musculoskeletal Ultrasound in Rheumatology Review. [employer] [place]; 2016      Procedure Note   LEFT HIP JOINT INJECTION     Clinical History: Medical, surgical, social, and family history were reviewed. They are unchanged.      Pre-procedure diagnosis: Primary localized Osteoarthritis of the LEFT HIP    Post-procedure diagnosis: Same    Site Injected: LEFT HIP JOINT    Side of approach: anterior longitudinal view, in-plane from inferior approach from  patient's left side    Needle utilized: 3.5" 22 g spinal needle    Medication utilized: 40 mg of Traimcinolone and 2 mL of 1% lidocaine           Anterior Longitudinal View. In-plane. Needle tip within the hip joint capsule.            Anterior Longitudinal View. Post-Injection capsule bulge.       Procedure: After reviewing indications, potential treatment benefit, treatment  alternatives, and discussing potential risks - including but not limited to  increased pain, infection, headache, bleeding, and the remote possibility of  catastrophic  neurologic injury or death - the patient elected to proceed with  today's injection. A copy of the  procedure consent form was offered to the  patient. Time-out was taken to confirm injection details and site was marked.     Current medications and allergies were reviewed.     Patient was laying on the exam table. Left proximal anterior thigh was prepared with chlorhexidine and topical anesthetic cryo-spray was used. Under ultrasound guidance, the needle tip was advanced to within the hip joint capsule. Introduction stylette  was removed. Medication containing syringe was secured. Withdrawing the syringe plunger and seeing no contraindication, the medication was smoothly delivered. Images of needle placement and  post-injection medication image were saved.      Complications: There were no noted complications     Discharge disposition: Ambulatory discharge to residence    Comments: Uneventful injection preformed - the patient tolerated the procedure  well.    Impression: Successful ultrasound guided injection.     Plan: Will follow up to assess the clinical impact of this procedure.  Grant Ruts, DO  [facility]   07/22/2021    1:42 PM

## 2021-07-23 ENCOUNTER — Encounter

## 2021-07-24 ENCOUNTER — Ambulatory Visit: Attending: Student in an Organized Health Care Education/Training Program | Primary: Family

## 2021-07-24 ENCOUNTER — Encounter

## 2021-07-24 ENCOUNTER — Inpatient Hospital Stay
Admit: 2021-07-24 | Discharge: 2021-07-24 | Payer: PRIVATE HEALTH INSURANCE | Attending: Student in an Organized Health Care Education/Training Program | Primary: Family

## 2021-07-24 ENCOUNTER — Ambulatory Visit
Admit: 2021-07-24 | Discharge: 2021-07-24 | Payer: PRIVATE HEALTH INSURANCE | Attending: Student in an Organized Health Care Education/Training Program | Primary: Family

## 2021-07-24 DIAGNOSIS — S82831D Other fracture of upper and lower end of right fibula, subsequent encounter for closed fracture with routine healing: Secondary | ICD-10-CM

## 2021-07-24 NOTE — Progress Notes (Signed)
CC: F/U - right nondisplaced distal fibula fracture, right nondisplaced 3rd and 4th proximal metatarsal fractures  Original Injury was 06/05/21 when she missed a step climbing into bed resulting in an inversion injury to right ankle and foot.  Barbara Elliott, 70 y.o., female, presents to clinic today for f/u of right ankle and foot fractures  Patient states that pain is 1/10 and well managed  They have been in a boot since the last appointment, nonweight bearing with crutches. Patient hoped to be transitioning out of the boot.  They have no new complaints or concerns.       GENERAL: Alert, well-developed, well-nourished, in no acute distress.  HEAD: atraumatic, normocephalic  EYES: no eye discharge or injection  NOSE: No rhinorrhea or discharge noted.  SKIN: Ecchymosis, No erythema, warmth, fluctuance or induration.  MUSCULO: Examination of bilateral extremities reveals neurovascularly intact limbs. Skin is cool, dry, and intact. There is no swelling or bruising of right ankle and foot. There is mild tenderness to palpation of distal fibula and base of 3rd and 4th metatarsals. There is full range of motion of the knee, hip, and all toes in all planes.     Imaging:  Right ankle: 2-view weightbearing xrays were taken today, mortise and lateral. Images were independently visualized and interpreted by myself.  No soft tissue or bony masses are noted.  There is no soft tissue swelling.  There is an oblique distal fibula fracture, nondisplaced with fracture line lucency more apparent in comparison to last xrays. No subluxations or dislocations.  There is anatomic alignment of the ankle mortise and syndesmosis. Spurring of the posterior talus.    Right foot: 2-view weightbearing xrays were taken today, A/P, Oblique, and Lateral views. These images were independently viewed and interpreted by myself.  No soft tissue or bony masses are noted.  There is no soft tissue swelling.  There are fractures of the 3rd and 4th proximal  metatarsals, nondisplaced.There is evidence of continued healing and new bone growth of the fracture sites. No subluxations or dislocations.      Assessment: Stable right, nondisplaced 3rd and 4th proximal metatarsal fractures, nondisplaced distal fibula fracture     Plan:   Return to clinic in 4 weeks  X-rays next appointment, right foot and ankle needed  Nonweight bearing due to distal fibular fracture still not showing new bone growth, remain in a boot at all times. I reiterated the importance of remaining in the boot at all times and using the crutches to be completely nonweight bearing.  Her metatarsal fractures are healing well and show new bone.  Continue to elevate and use Tylenol for pain management as needed  Is currently taking Vitamin D and calcium  Discussed with her to not be doing any impact activities or sports that will put her at risk of reinjury    Lynnette Caffey, PA-C

## 2021-08-05 ENCOUNTER — Ambulatory Visit: Attending: Internal Medicine | Primary: Family

## 2021-08-05 ENCOUNTER — Ambulatory Visit
Admit: 2021-08-05 | Discharge: 2021-08-05 | Payer: PRIVATE HEALTH INSURANCE | Attending: Internal Medicine | Primary: Family

## 2021-08-05 DIAGNOSIS — M1991 Primary osteoarthritis, unspecified site: Secondary | ICD-10-CM

## 2021-08-05 DIAGNOSIS — M1611 Unilateral primary osteoarthritis, right hip: Secondary | ICD-10-CM

## 2021-08-05 MED ORDER — TRIAMCINOLONE ACETONIDE 40 MG/ML SUSP FOR INJECTION
40 mg/mL | Freq: Once | INTRAMUSCULAR | Status: AC
Start: 2021-08-05 — End: 2021-08-05
  Administered 2021-08-05: 20:00:00 via INTRA_ARTICULAR

## 2021-08-05 NOTE — Progress Notes (Signed)
Progress Notes by Grant Ruts, DO at 08/05/21 1400                Author: Gershon Crane, Henri Medal, DO  Service: --  Author Type: Physician       Filed: 08/05/21 1632  Encounter Date: 08/05/2021  Status: Signed          Editor: Grant Ruts, DO (Physician)                       [facility]   [address]   [place] [address]   Dept: [phone]         Ultrasound Report:   Exam Date/Time: 08/05/2021 2:31 PM    Patient Name: Barbara Elliott    Patient DOB: 12/18/1951    Patient MRN: [id number]    Equipment / Probe: Butterfly iQ+       Exam:    CPT Code:    20611 Arthrocentesis Major joint    Modifiers:   26    TC      Indication:      M16.1  Hip OA   M25.551 Hip Pain        Protocol used:    Kissin, E. USSONAR Protocols. [place]. 2017    Talbert Cage. Musculoskeletal Ultrasound in Rheumatology Review. [employer] [place]; 2016      Procedure Note   RIGHT HIP THERAPEUTIC ARTHROCENTESIS WITH ULTRASOUND     Clinical History: Medical, surgical, social, and family history were reviewed. They are unchanged.      Pre-procedure diagnosis: Osteoarthritis, Primary, Localized    Post-procedure diagnosis: Same    Site Injected: RIGHT HIP JOINT    Side of approach: anterior longitudinal in-plane from inferior approach    Needle utilized:  3.5" 22 g spinal needle    Medication utilized: 40 mg of triamcinolone with 2 mL of 1% lidocaine           Anterior longitudinal. In-plane. Needle tip within the joint capsule.            Anterior longitudinal. Post-injection capsule distention.       Procedure: After reviewing indications, potential treatment benefit, treatment  alternatives, and discussing potential risks - including but not limited to  increased pain, infection, headache, bleeding, and the remote possibility of  catastrophic  neurologic injury or death - the patient elected to proceed with  today's injection. A copy of the procedure consent form was offered to the  patient.  Time-out was taken to confirm injection details and site was marked.     Current medications and allergies were reviewed.     The patient was laying supine on the exam table with legs extended. The proximal right anterior thigh was prepared with chlorhexidine. Cryospray was used for topical anesthetic. Under sterile technique, the needle tip was advanced to within the right  hip joint capsule. The stylette was removed and medication syringe was secured. After applying negative pressure by pulling back gently on the syringe and seeing no contraindication, the medication was smoothly delivered. Images were saved. Key images  of needle tip placement and post injection capsule distention were saved above.       Complications: Mild ecchymosis at the site of skin puncture where a superficial vein is located. Gentle pressure applied. <63ml EBL. Hemostasis within 15 seconds and a adhesive bandage was applied.     Discharge disposition: Ambulatory discharge  to residence  Comments: Uneventful injection preformed - the patient tolerated the procedure  well.    Impression: Successful ultrasound guided injection.     Plan: Will follow up to assess the clinical impact of this procedure.                  Grant Ruts, DO  [facility]   08/05/2021    2:31 PM

## 2021-08-06 ENCOUNTER — Telehealth

## 2021-08-06 NOTE — Telephone Encounter (Signed)
Rec'd MyChart message from Pt stating the following:    I have been experiencing right leg pain radiating from hip to foot which seems to be sciatic. symptoms began June 4 and have increased in frequency and intensity since then. I have a healing fx of right fibula with f/u due on 8/28. Have received left and right hip corticosteriod inj  by rheumatology but leg pain persists. Dr Gershon Crane rheumatology suggests a referral to determine the cause ie electromyography. Are you able to arrange that?      Pricila      Please review referral for EMG and complete/correct before signing.    Thank you,  Beverlee Nims

## 2021-08-14 MED ORDER — METOPROLOL SUCCINATE SR 25 MG 24 HR TAB
25 mg | ORAL_TABLET | ORAL | 1 refills | Status: DC
Start: 2021-08-14 — End: 2021-09-11

## 2021-08-20 ENCOUNTER — Encounter

## 2021-08-21 ENCOUNTER — Ambulatory Visit: Attending: Student in an Organized Health Care Education/Training Program | Primary: Family

## 2021-08-21 ENCOUNTER — Inpatient Hospital Stay: Admit: 2021-08-21 | Discharge: 2021-08-21 | Payer: PRIVATE HEALTH INSURANCE | Primary: Family

## 2021-08-21 ENCOUNTER — Ambulatory Visit
Admit: 2021-08-21 | Discharge: 2021-08-21 | Payer: PRIVATE HEALTH INSURANCE | Attending: Student in an Organized Health Care Education/Training Program | Primary: Family

## 2021-08-21 DIAGNOSIS — S82831D Other fracture of upper and lower end of right fibula, subsequent encounter for closed fracture with routine healing: Secondary | ICD-10-CM

## 2021-08-21 DIAGNOSIS — S99101A Unspecified physeal fracture of right metatarsal, initial encounter for closed fracture: Secondary | ICD-10-CM

## 2021-08-21 NOTE — Progress Notes (Signed)
CC: F/U - right nondisplaced distal fibula fracture, right nondisplaced 3rd and 4th proximal metatarsal fractures  Original Injury was 06/05/21 when she missed a step climbing into bed resulting in an inversion injury to right ankle and foot.  Barbara Elliott, 70 y.o., female, presents to clinic today for f/u of right ankle and foot fractures  Patient states that pain is 0/10   They have been in a boot since the last appointment, weight bearing as tolerated.  They have no new complaints or concerns.       GENERAL: Alert, well-developed, well-nourished, in no acute distress.  HEAD: atraumatic, normocephalic  EYES: no eye discharge or injection  NOSE: No rhinorrhea or discharge noted.  SKIN: Ecchymosis, No erythema, warmth, fluctuance or induration.  MUSCULO: Examination of bilateral extremities reveals neurovascularly intact limbs. Skin is cool, dry, and intact. There is no swelling or bruising of right ankle and foot. There is mild tenderness to palpation of distal fibula. No tenderness to palpation along the metatarsals. There is full range of motion of the knee, hip, and all toes in all planes.     Imaging:  Right ankle: 2-view weightbearing xrays were taken today, mortise and lateral. Images were independently visualized and interpreted by myself.  No soft tissue or bony masses are noted.  There is no soft tissue swelling.  There is an oblique distal fibula fracture, fracture lines are less apparent with new bone growth. No subluxations or dislocations.  There is anatomic alignment of the ankle mortise and syndesmosis. Spurring of the posterior talus.    Right foot: 2-view weightbearing xrays were taken today, A/P, Oblique, and Lateral views. These images were independently viewed and interpreted by myself.  No soft tissue or bony masses are noted.  There is no soft tissue swelling.  There are fractures of the 3rd and 4th proximal metatarsals which have healed with new bone, fracture lines are no longer  appreciated. No subluxations or dislocations.      Assessment: Healed right foot nondisplaced 3rd and 4th proximal metatarsal fractures and a stable nondisplaced distal fibula fracture     Plan:   Return to clinic in 4 weeks  X-rays next appointment, just the right ankle  Patient was transitioned to a lace up ankle brace to be worn with all activity but may be removed while resting and sleeping. Weightbearing as tolerated. I did explain that she can expect increased pain and swelling the next 4 weeks due to weightbearing now without the boot.  Instructed and demonstrated ankle ROM and gentle achilles tendon stretches to perform daily.   Continue to elevate and use Tylenol for pain management as needed  Discussed with her to not be doing any impact activities or sports that will put her at risk of reinjury    Lynnette Caffey, PA-C

## 2021-09-04 ENCOUNTER — Telehealth: Attending: Internal Medicine | Primary: Family

## 2021-09-04 ENCOUNTER — Ambulatory Visit
Admit: 2021-09-04 | Discharge: 2021-09-04 | Payer: PRIVATE HEALTH INSURANCE | Attending: Internal Medicine | Primary: Family

## 2021-09-04 DIAGNOSIS — M25551 Pain in right hip: Secondary | ICD-10-CM

## 2021-09-04 NOTE — Progress Notes (Signed)
I was in the office and the patient was at home while conducting this encounter.    Consent:  She and/or her healthcare decision maker is aware that this patient-initiated Telehealth encounter is a billable service, with coverage as determined by her insurance carrier. She is aware that she may receive a bill and has provided verbal consent to proceed: Yes    This virtual visit was conducted telephone encounter only. -  I affirm this is a Patient Initiated Episode with an Established Patient who has not had a related appointment within my department in the past 7 days or scheduled within the next 24 hours.  Note: this encounter is not billable if this call serves to triage the patient into an appointment for the relevant concern.      Total Time: minutes: 21-30 minutes.        Rheumatology Vitual Visit Note     Patient Name: Barbara Elliott   Patient MRN: [id number]   Date of Service: 09/04/2021     Visit Type: Virtual Visit - Follow up after injection    ASSESSMENT AND PLAN:   70 y.o. female. Neita had about 2 weeks of relief generally from the right hip joint injection (08/05/2021)  "My right hip isn't uncomfortable since the procedure" after about two weeks, the leg pain returned (calf, thigh)    Almost no discomfort at the left hip. (Injected 07/22/2021)    Functionally limited with poor tolerance due to leg pain.         She would like to be scheduled for a left hip joint injection in the future (repeat).       ICD-10-CM ICD-9-CM   1. Right hip pain  M25.551 719.45   2. Primary osteoarthritis of right hip  M16.11 715.15   3. Primary localized osteoarthrosis  M19.91 715.10      Problem List Items Addressed This Visit          Skeletal    Primary osteoarthritis of right hip    Primary localized osteoarthrosis       Other    Right hip pain - Primary       Follow-up and Dispositions    Return for left hip joint injection in about four months.          Grant Ruts, DO  9:45 AM         SUBJECTIVE / OBJECTIVE:    Barbara Elliott is a 70 y.o. female. She has Osteoarthritis. Moderate to advanced at the bilateral hips.   Hip joints were injected under ultrasound guidance. In late May 2022, she sustained a right metatarsal fracture requiring immobilization and weight bearing restrictions. (Sustained while tripping over her dog while getting in/out of bed). The boot is off for the past two weeks. Symptoms were present prior to the fracture for which she had a boot. The leg discomfort has been present since moving residences.     She is generally feeling well with regard to the hip joints, but has ongoing thigh/calf discomfort. Denies back pain.   The discomfort is reminiscent of 20 years ago with a "pinched" nerve in her back that was relieved with decompressive surgery.     The current pain does radiate to the foot    She does have an EMG coming up in the near future which should be able to differentiate whether a compressive neuropathy might be responsible for her symptoms of leg pain.     Using ibuprofen and tylenol.  Exam not able to be performed - this is a virtual visit      Grant Ruts, DO  09/04/2021   9:45 AM     This document was prepared in part using voice transcription software. Errors may occur and if there are questions regarding the content of this document, please contact the author.

## 2021-09-10 NOTE — Telephone Encounter (Signed)
Telephone Encounter by Donne Anon at 09/10/21 1424                Author: Donne Anon  Service: --  Author Type: Registered Nurse       Filed: 09/10/21 1424  Encounter Date: 09/10/2021  Status: Signed          Editor: Donne Anon (Registered Nurse)          From: Burley Saver: Mylinda Latina, FNPSent: 09/10/2021  1:48 PM EDTSubject: bilateral leg painI am scheduled for a Electromyography .test Oct 27I  have received corticosteroid injections in both hip joints resulting in some relief but only for about 2 weeks and now the intensity and limited mobility has increased in both legsI admit to being uncomfortable with the EMG process and wonder if an MRI  would be possible at least to rule out a spinal disc issue since I had a herniated disc in 1998 which caused similar pain issues and was resolved with surgery. I appreciate your opinion on this dilema.Donnamarie Poag

## 2021-09-11 ENCOUNTER — Encounter

## 2021-09-11 MED ORDER — METOPROLOL SUCCINATE SR 25 MG 24 HR TAB
25 mg | ORAL_TABLET | ORAL | 1 refills | Status: AC
Start: 2021-09-11 — End: ?

## 2021-09-15 ENCOUNTER — Ambulatory Visit: Attending: Orthopaedic Surgery | Primary: Family

## 2021-09-15 ENCOUNTER — Ambulatory Visit
Admit: 2021-09-15 | Discharge: 2021-09-15 | Payer: PRIVATE HEALTH INSURANCE | Attending: Orthopaedic Surgery | Primary: Family

## 2021-09-15 DIAGNOSIS — M1612 Unilateral primary osteoarthritis, left hip: Secondary | ICD-10-CM

## 2021-09-15 NOTE — Progress Notes (Signed)
CC: bilateral leg pain    HPI:   From 04/11/21 - "The patient is a pleasant 70 y.o. year-old F who presents today for evaluation of left hip pain.  The hip has been painful for years. She has no Hx of trauma or surgery of the hip.  The pain has become increasingly severe over the past 6 months. The pain started in groin and is now moving towards lateral aspect of the hip. The pain occasionally radiates down the thigh typically when she wakes up.  The patient describes the pain as dull and aching in nature.  The pain is constant, and occasionally there are severe exacerbations of the pain.  The pain is exacerbated by prolonged standing and sudden movements .  From a functional standpoint, the patient has difficulty navigating stairs reciprocally and walking on uneven surfaces . She reports a worsening in her uneven gait.  The patient has tried conservative measures including Tylenol and physical therapy. They noticed improvement with physical therapy. Despite these treatments for greater than 6 months, the pain persists. The pain has become severe enough to interfere with activities of daily living.     She denies any extended steroid use, hx of heavy drinking, exposure to radiation, deep sea diving or FMHx of sickle cell. She is an occasional smoker, when she drinks wine. She has some days without smoking."    From 06/23/21 - She returns today for follow-up after having completed her MRI of the left hip. She foot pain after breaking her foot while getting into bed 2 weeks ago. For a couple weeks prior she was experiencing burning radiating down the right leg. The pain was exacerbated after the injury to her foot.  She has not yet started physical therapy, as she was waiting clearance to begin full weight-bearing.    She returns today for follow-up.  Since last visit she has experienced more neuropathic pain in bilateral extremities.  She has a previous history of lumbar decompression, twice.  She reports that the pain  that she is feeling is can to the pain before decompression.  It is a burning pain that radiates the buttocks down to foot.  Dull, aching, sharp, burning in nature, bilateral. She has undergone left hip injection on July 26th and a right hip injection on August 9th by Dr. Gershon Crane. This injection provided 1.5 weeks of pain relief in the left leg and no pain relief in the right leg.  The pain has become severe enough to interfere with both activities of daily living and general quality of life.    She has an EMG scheduled on October 27.      Past Medical History:   Diagnosis Date    Headache     High blood pressure     High cholesterol     Wears glasses        Current Outpatient Medications:     metoprolol succinate (TOPROL-XL) 25 mg XL tablet, TAKE 1 TABLET BY MOUTH EVERY DAY FOR BLOOD PRESSURE, Disp: 30 Tablet, Rfl: 1    aspirin 81 mg chewable tablet, Take 81 mg by mouth in the morning., Disp: , Rfl:     glucosamine-chondroitin (ARTHX) 500-400 mg cap, Take 1 Capsule by mouth daily., Disp: , Rfl:     acetaminophen (TYLENOL) 325 mg tablet, Take  by mouth every four (4) hours as needed for Pain., Disp: , Rfl:     ibuprofen (MOTRIN) 600 mg tablet, Take  by mouth every six (6) hours  as needed for Pain., Disp: , Rfl:     amLODIPine (NORVASC) 10 mg tablet, Take 1 Tablet by mouth daily., Disp: 90 Tablet, Rfl: 3    atorvastatin (LIPITOR) 40 mg tablet, Take 1 Tablet by mouth daily., Disp: , Rfl:     multivitamin (ONE A DAY) tablet, Take 1 Tablet by mouth daily., Disp: , Rfl:     cholecalciferol, vitamin D3, 50 mcg (2,000 unit) tab, Take 1 Tablet by mouth daily., Disp: , Rfl:     calcium carbonate (CALTREX) 600 mg calcium (1,500 mg) tablet, Take 600 mg by mouth two (2) times a day., Disp: , Rfl:     Shingrix, PF, 50 mcg/0.5 mL susr injection, Administer 0.45ml IM injection once now and a second injection in 2-6 months  Indications: shingles vaccination (Patient not taking: No sig reported), Disp: 0.5 mL, Rfl: 1  Allergies    Allergen Reactions    Codeine Shortness of Breath    Lisinopril Cough     Past Surgical History:   Procedure Laterality Date    HX HYSTERECTOMY  2012    HX LUMBAR DISKECTOMY  1998    HX OTHER SURGICAL      pelvic floor, mesh and raise bladder    IR CHOLECYSTOSTOMY PERCUTANEOUS  1980     Family History   Problem Relation Age of Onset    Hypertension Mother     Diabetes Father     Colon Cancer Sister 35        colostomy    Other Sister         prediabetic    Obesity Sister     No Known Problems Brother     Mastectomy Maternal Grandmother 64        breast cancer    No Known Problems Maternal Grandfather     No Known Problems Paternal Grandmother     Diabetes Paternal Grandfather     OSTEOARTHRITIS Brother 60        2 knee replacement    No Known Problems Brother     Uterine Cancer Neg Hx     Ovarian Cancer Neg Hx      Social History     Socioeconomic History    Marital status: MARRIED     Spouse name: [name]    Number of children: 4    Years of education: ADN    Highest education level: Not on file   Occupational History    Occupation: Retired     Comment: Nurse-   Tobacco Use    Smoking status: Some Days     Packs/day: 0.25     Years: 40.00     Pack years: 10.00     Types: Cigarettes    Smokeless tobacco: Never    Tobacco comments:     2 a day   Vaping Use    Vaping Use: Never used   Substance and Sexual Activity    Alcohol use: Yes     Alcohol/week: 1.0 standard drink     Types: 1 Glasses of wine per week     Comment: 1-2 glasses per night    Drug use: Never    Sexual activity: Yes     Partners: Male     Birth control/protection: Surgical   Other Topics Concern    Not on file   Social History Narrative    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 Married to [employer].    4 children together:    Gregary Signs  1970     -Adam 1972 Grand child- 1 boy    -Carrie 1975 Grand child -1 girl    -[name] [phone] - 3 girls        She does currently smoke cigarettes - 1/2 a pack a week, not every day.    Her husband does smoke cigars.    She  does not use illicit drugs.    She does drink wine 1-2 glasses at night.        She enjoys her time with grand children, reading.        Wears a seat belt in a car.             Social Determinants of Health     Financial Resource Strain: Low Risk     Difficulty of Paying Living Expenses: Not hard at all   Food Insecurity: No Food Insecurity    Worried About Programme researcher, broadcasting/film/video in the Last Year: Never true    Barista in the Last Year: Never true   Transportation Needs: Not on file   Physical Activity: Not on file   Stress: Not on file   Social Connections: Not on file   Intimate Partner Violence: Not on file   Housing Stability: Not on file         ROS: Musculoskeletal and general systems were reviewed with the patient and are negative except as above.    Examination: There were no vitals taken for this visit.  The patient is awake, alert and appropriate.  The head is normocephalic.  The sclerae are white.  The skin has normal turgor.  The abdomen is non-protruded.  The chest expands normally with deep inspiration.  The cardiac rhythm is palpable at the wrist.    Focused examination of the right hip reveals normal-appearing skin over the trochanter. Painless passive rotation. Motor testing of the lower extremities reveals 4/5 quad and hamstring on the lower right extremity with the remainder of TA, EHL, GSC being 5/5.    Focused examination of the left hip reveals normal-appearing skin over the trochanter.  Mild tenderness with rotation, internal rotation worse than external rotation. Motor testing of the lower extremities 4/5 EHL on the left with the remainder of quad, hamstrings, TA, and GSC being 5/5.    Normal patellar and achilles reflexes bilaterally    Imaging: no new imaging at todays visit    Assessment: 70 y.o. F with lumbar radiculopathy, bilateral hip OA    Plan:   I have discussed the diagnosis with the patient.  She has not had any major improvement with the intra-articular hip injections, and  the pain that she describes today is more neuropathic in nature.  I feel that we should shift attention away from the hip towards the lumbar spine.  I do not feel that a total hip arthroplasty would help her with her with radicular pain radiating into the feet.  She has an EMG scheduled.  I placed an order for a lumbar spine MRI, as well as referral to physiatry in order to help the patient establish care with a local spine specialist.  Follow up with me as needed if she has worsening groin pain and desires to consider total hip arthroplasty.    This chart was dictated using M Modal, a voice to text software; inherent to using this technology, there may be incorrectly spelled words, or erroneous phrases. Please call the office with any questions or concerns.  Provider Attestation.  I was present with Patient and was seen examined and evaluated by me 09/15/2021 1:12 PM and I agree with the documentation by scribe.    Scribe Attestation:   September 15, 2021 at 1:26 PM - [place] scribing for and in the presence of Dr.Eloise Mula Alma Friendly, Scribe    A copy of this note will be sent to the referring provider.

## 2021-09-16 NOTE — Telephone Encounter (Signed)
Name and date of birth verified.  What is your reason or diagnosis for visit:   Lumbar radiculopathy, chronic    Is this going through the [place]?            If YES, Do we have up to date referral for the correct body part?          Is this referral Workers Comp Related:             If YES, What is the date of your injury       What is your [employer]?      Claim#:         Have you seen a pain management provider in the past:            If YES, What was the name of the provider:        The office name:          Have you had any imaging done w/in the past 2 years?            If YES, where:          Have you had any injections done in the past?            If YES, where:          When:                 What type of injection:            Have you ever seen a chiropractor for this issue?            If YES, where:           When:          Have you ever had physical therapy for this issue:            If YES, where:          When:             Please note our providers do not prescribe any opioid medications at this practice. Do you have any questions regarding this:            As we prepare for you upcoming visit, our office will need your previous medical records including imaging reports and CD's prior to your scheduled visit on        Did scheduler request records?         Please note our office is a scent neutral zone, please refrain from wearing perfume, cologne, and/or other fragrances.    NP Physiatry appt w/ any provider     1st call - LMOVM    Sanjuana Kava

## 2021-09-22 ENCOUNTER — Encounter

## 2021-09-25 ENCOUNTER — Encounter

## 2021-09-26 ENCOUNTER — Ambulatory Visit: Attending: Student in an Organized Health Care Education/Training Program | Primary: Family

## 2021-09-26 ENCOUNTER — Ambulatory Visit
Admit: 2021-09-26 | Discharge: 2021-09-26 | Payer: PRIVATE HEALTH INSURANCE | Attending: Student in an Organized Health Care Education/Training Program | Primary: Family

## 2021-09-26 ENCOUNTER — Inpatient Hospital Stay: Admit: 2021-09-26 | Discharge: 2021-09-26 | Payer: PRIVATE HEALTH INSURANCE | Primary: Family

## 2021-09-26 DIAGNOSIS — S82831D Other fracture of upper and lower end of right fibula, subsequent encounter for closed fracture with routine healing: Secondary | ICD-10-CM

## 2021-09-26 DIAGNOSIS — S82831A Other fracture of upper and lower end of right fibula, initial encounter for closed fracture: Secondary | ICD-10-CM

## 2021-09-26 NOTE — Telephone Encounter (Signed)
Name and date of birth verified.  What is your reason or diagnosis for visit:   Lumbar radiculopathy, chronic     Is this going through the [place]?   no         If YES, Do we have up to date referral for the correct body part?           Is this referral Workers Comp Related:    no         If YES, What is the date of your injury       What is your [facility] Insurance?      Claim#:          Have you seen a pain management provider in the past:    no        If YES, What was the name of the provider:        The office name:           Have you had any imaging done w/in the past 2 years?     no       If YES, where:           Have you had any injections done in the past?      yes      If YES, where:  bil hips  at rheumatology [facility]      When:                 What type of injection:    cortisone         Have you ever seen a chiropractor for this issue?  no          If YES, where:           When:           Have you ever had physical therapy for this issue:    no        If YES, where:          When:               Please note our providers do not prescribe any opioid medications at this practice. Do you have any questions regarding this:   no          As we prepare for you upcoming visit, our office will need your previous medical records including imaging reports and CD's prior to your scheduled visit on                   Did scheduler request records?          Please note our office is a scent neutral zone, please refrain from wearing perfume, cologne, and/or other fragrances.     NP Physiatry appt w/[facility] on 10/18 at 11:30 am.    [phone] (home)   Barbara Elliott

## 2021-09-26 NOTE — Progress Notes (Signed)
FOLLOW-UP VISIT  ______________________________________________________________________    Name: Barbara Elliott  DOB: 04-25-1951  Today's Date: 09/26/2021  Last Visit: 09/15/2021    Chief Complaint/Diagnosis:  Chief Complaint   Patient presents with    Follow-up     Right foot/ankle       ______________________________________________________________________    HPI:  Barbara Elliott is a very pleasant 70 y.o. female who presents for left ankle fracture care. Original injury on 06/05/21. Last visit she transitioned to a lace up ankle brace, still no high impact activities. Patient states she has not pain from her right ankle or any symptoms of instability. Is currently having pain from her lower back with radiculopathy symptoms.  ______________________________________________________________________    PMHx:   Past Medical History:   Diagnosis Date    Headache     High blood pressure     High cholesterol     Wears glasses        PSgHx:   Past Surgical History:   Procedure Laterality Date    HX HYSTERECTOMY  2012    HX LUMBAR DISKECTOMY  1998    HX OTHER SURGICAL      pelvic floor, mesh and raise bladder    IR CHOLECYSTOSTOMY PERCUTANEOUS  1980       ALLERGIES:   Allergies   Allergen Reactions    Codeine Shortness of Breath    Lisinopril Cough       MEDS:   Current Outpatient Medications:     metoprolol succinate (TOPROL-XL) 25 mg XL tablet, TAKE 1 TABLET BY MOUTH EVERY DAY FOR BLOOD PRESSURE, Disp: 30 Tablet, Rfl: 1    aspirin 81 mg chewable tablet, Take 81 mg by mouth in the morning., Disp: , Rfl:     glucosamine-chondroitin (ARTHX) 500-400 mg cap, Take 1 Capsule by mouth daily., Disp: , Rfl:     acetaminophen (TYLENOL) 325 mg tablet, Take  by mouth every four (4) hours as needed for Pain., Disp: , Rfl:     ibuprofen (MOTRIN) 600 mg tablet, Take  by mouth every six (6) hours as needed for Pain., Disp: , Rfl:     Shingrix, PF, 50 mcg/0.5 mL susr injection, Administer 0.9ml IM injection once now and a second injection in  2-6 months  Indications: shingles vaccination, Disp: 0.5 mL, Rfl: 1    amLODIPine (NORVASC) 10 mg tablet, Take 1 Tablet by mouth daily., Disp: 90 Tablet, Rfl: 3    atorvastatin (LIPITOR) 40 mg tablet, Take 1 Tablet by mouth daily., Disp: , Rfl:     multivitamin (ONE A DAY) tablet, Take 1 Tablet by mouth daily., Disp: , Rfl:     cholecalciferol, vitamin D3, 50 mcg (2,000 unit) tab, Take 1 Tablet by mouth daily., Disp: , Rfl:     calcium carbonate (CALTREX) 600 mg calcium (1,500 mg) tablet, Take 600 mg by mouth two (2) times a day., Disp: , Rfl:     SHx:   Social History     Socioeconomic History    Marital status: MARRIED     Spouse name: [name]    Number of children: 4    Years of education: ADN    Highest education level: Not on file   Occupational History    Occupation: Retired     Comment: Nurse-   Tobacco Use    Smoking status: Some Days     Packs/day: 0.25     Years: 40.00     Pack years: 10.00     Types: Cigarettes  Smokeless tobacco: Never    Tobacco comments:     2 a day   Vaping Use    Vaping Use: Never used   Substance and Sexual Activity    Alcohol use: Yes     Alcohol/week: 1.0 standard drink     Types: 1 Glasses of wine per week     Comment: 1-2 glasses per night    Drug use: Never    Sexual activity: Yes     Partners: Male     Birth control/protection: Surgical   Other Topics Concern    Not on file   Social History Narrative    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 Married to [employer].    4 children together:    - Sean 1970     -Adam 1972 Grand child- 1 boy    -Lyla Son 1975 Grand child -1 girl    -[name] [phone] - 3 girls        She does currently smoke cigarettes - 1/2 a pack a week, not every day.    Her husband does smoke cigars.    She does not use illicit drugs.    She does drink wine 1-2 glasses at night.        She enjoys her time with grand children, reading.        Wears a seat belt in a car.             Social Determinants of Health     Financial Resource Strain: Low Risk     Difficulty of  Paying Living Expenses: Not hard at all   Food Insecurity: No Food Insecurity    Worried About Programme researcher, broadcasting/film/video in the Last Year: Never true    Barista in the Last Year: Never true   Transportation Needs: Not on file   Physical Activity: Not on file   Stress: Not on file   Social Connections: Not on file   Intimate Partner Violence: Not on file   Housing Stability: Not on file       FHx:   Family History   Problem Relation Age of Onset    Hypertension Mother     Diabetes Father     Colon Cancer Sister 30        colostomy    Other Sister         prediabetic    Obesity Sister     No Known Problems Brother     Mastectomy Maternal Grandmother 35        breast cancer    No Known Problems Maternal Grandfather     No Known Problems Paternal Grandmother     Diabetes Paternal Grandfather     OSTEOARTHRITIS Brother 60        2 knee replacement    No Known Problems Brother     Uterine Cancer Neg Hx     Ovarian Cancer Neg Hx      ______________________________________________________________________    ROS  General: no fevers, night sweats, unexplained weight loss  Psychiatric: no suicidal ideation  Neurologic: no headaches, loss of consciousness or change in vision  Cardiovascular: no chest pain  Respiratory: no shortness of breath  GI: no abdominal pain, no ulcers  Skin: no infections, no rashes  Hematologic: no bleeding diatheses  Musckuloskeletal:   Chief Complaint   Patient presents with    Follow-up     Right foot/ankle       ______________________________________________________________________  PHYSICAL EXAM:right foot and ankle  There were no vitals taken for this visit.  @PATIENTWT @  There is no height or weight on file to calculate BMI.    General  The patient is a well-appearing 70 y.o. female who appears her stated age. she is alert and oriented x3, pleasant, conversive, interactive and appropriate.    Gait  Antalgic (from lower back pain)    Skin  Foot/Ankle:  Intact, no abrasions or open  wounds.    Inspection/Palpation  Bilateral pes planovalgus  No tenderness to palpation along the peroneal tendon, anterior tibiotalar joint, or lateral malleolus  Full ROM of ankle and midfoot    Vascular  The foot is warm and well-perfused. 2+ dorsalis pedis pulse. Capillary refill is brisk to all digits.    Sensory  Sensation is intact to light touch in all 5 nerve distributions to the foot.    Motor  Fires extensor hallucis longus, flexor hallucis longus, extensor digitorum, flexor digitorum, anterior tibialis, gastrocnemius.    ______________________________________________________________________    IMAGING:  Right ankle: 2-view weightbearing xrays were taken today, mortise and lateral. Images were independently visualized and interpreted by myself.  No soft tissue or bony masses are noted.  There is no soft tissue swelling.  Evidence of previous comminuted distal fibula fracture with continuous healing. There is anatomic alignment of the ankle mortise and syndesmosis.  Symmetric joint spaces are present.  There are no degenerative changes in the tibiotalar, distal tibia-fibula and subtalar joints.   ______________________________________________________________________    IMPRESSION:  Encounter Diagnoses     ICD-10-CM ICD-9-CM   1. Closed fracture of distal end of right fibula with routine healing, unspecified fracture morphology, subsequent encounter  S82.831D V54.16   2. Primary osteoarthritis of left hip  M16.12 715.15   3. Primary osteoarthritis of right hip  M16.11 715.15   4. Lumbar radiculopathy, chronic  M54.16 724.4     ______________________________________________________________________    PLAN:  Patient continue to do well, has no pain or issues with her right ankle. She may return to all activities as normal in a regular shoe. She will follow up in 6 weeks for a final xray of her right ankle. Patient understands and is in agreement with this plan.    FOLLOW-UP: in 6 week(s)    IMAGING NEXT VISIT:  yes right ankle 3 V    Disclaimer:  Portion of this document may contain text elements generated through computerized voice recognition.  Undetected computer generated word errors are possible. Please notify the signing provider or return the document to [facility] Medical Records Department if clarification or correction is needed.  ______________________________________________________________________  8:24 AM, 09/26/2021

## 2021-09-27 ENCOUNTER — Encounter

## 2021-09-28 ENCOUNTER — Encounter

## 2021-09-30 ENCOUNTER — Encounter: Attending: Internal Medicine | Primary: Family

## 2021-10-06 MED ORDER — METOPROLOL SUCCINATE ER 25 MG PO TB24
25 MG | ORAL_TABLET | ORAL | 1 refills | Status: DC
Start: 2021-10-06 — End: 2022-04-06

## 2021-10-06 MED ORDER — ATORVASTATIN CALCIUM 40 MG PO TABS
40 MG | ORAL_TABLET | Freq: Every day | ORAL | 3 refills | Status: AC
Start: 2021-10-06 — End: ?

## 2021-10-06 NOTE — Telephone Encounter (Signed)
Name from pharmacy: METOPROLOL SUCC ER 25 MG TAB          Will file in chart as: metoprolol succinate (TOPROL XL) 25 MG extended release tablet    Sig: TAKE 1 TABLET BY MOUTH EVERY DAY FOR BLOOD PRESSURE    Disp:  30 tablet    Refills:  1    Start: 10/04/2021    Class: Normal    Non-formulary    Last ordered: 1 week ago by [facility] [employer]ciliation Last refill: 09/11/2021    Rx #: [id number]    Beta-Blockers Protocol Passed 10/04/2021 12:30 PM   Protocol Details  Last Pulse reading greater than 50 recorded within past year    Visit with authorizing provider in past 9 months or upcoming 90 days      To be filled at: [id number] [place], [place] - [address] - P [phone] - F [phone]   Sent for 90 per protocol,

## 2021-10-06 NOTE — Telephone Encounter (Signed)
-----   Message from Chelsea Primus, RN sent at 10/06/2021  3:41 PM EDT -----  Regarding: FW: refill    ----- Message -----  From: Prescott Parma  Sent: 10/06/2021   1:53 PM EDT  To: [facility] Clinical Staff  Subject: refill                                           I need atorvastatin refill sent to [facility] [address]  my previous prescriptions came from [facility] in [place]  Thank you, [place]

## 2021-10-06 NOTE — Telephone Encounter (Signed)
Patient name and DOB verified    Barbara Elliott from [facility] calling in stating that they are still waiting on approval for the Pts MRI by insurance. The MRI is currently being held up in clinical review. Pts MRI is scheduled for 10/11.       [phone]    [name]

## 2021-10-07 ENCOUNTER — Ambulatory Visit: Payer: MEDICARE | Primary: Family

## 2021-10-10 NOTE — Telephone Encounter (Signed)
Barbara Elliott is a 70 y.o. female    Patient's MRI got r/s from 10/07/21 due to [place] not having PA    It is r/s to 10/13/21 But they have not received a PA yet     She spoke with insurance and they said a peer to peer is needed and paperwork on this was sent yesterday per patient     Please check on this and call to update     [phone] (home)   [name]

## 2021-10-13 ENCOUNTER — Ambulatory Visit: Payer: MEDICARE | Primary: Family

## 2021-10-13 NOTE — Telephone Encounter (Signed)
Patient name and DOB verified    Pt calling to inquire about her upcoming MRI appointments that are currently pending review due to pt's insurance. Writer advised pt that the appointments are still pending and that she would be reached out to once a decision has been made. Please reach out to pt to discuss once decision has been reached.    [phone] (home)   Barbara Elliott

## 2021-10-13 NOTE — Telephone Encounter (Signed)
Called patient to let them know we hadn't received peer to peer paperwork, no answer. Left message, let them know that if they were able to send peer to peer via MyChart we would accept it.

## 2021-10-14 ENCOUNTER — Ambulatory Visit
Admit: 2021-10-14 | Discharge: 2021-10-14 | Payer: MEDICARE | Attending: Physical Medicine & Rehabilitation | Primary: Family

## 2021-10-14 DIAGNOSIS — M545 Low back pain, unspecified: Secondary | ICD-10-CM

## 2021-10-14 MED ORDER — PREDNISONE 10 MG PO TABS
10 MG | ORAL_TABLET | ORAL | 0 refills | Status: DC
Start: 2021-10-14 — End: 2021-10-24

## 2021-10-14 NOTE — Assessment & Plan Note (Signed)
I do feel that her hip arthritis is contributing to the problem.  The reverse Trendelenburg gait is certainly going to increase vertebral motion and potentially irritate the zygapophyseal joints and nerve roots.

## 2021-10-14 NOTE — Assessment & Plan Note (Signed)
Her pain is consistent with radiculopathy.  MRI would be very useful.  While we are waiting for this burst of oral steroids is provided in hopes that it gives her relief.

## 2021-10-14 NOTE — Telephone Encounter (Signed)
Patient name and DOB verified          Pt is calling stating she does not have anything on mychart from Dr. Namon Cirri.  She is asking for a return call today regarding where it stands with her Lumbar MRI?Marland Kitchen    Please call to advise    [phone] (home)       Kerby Nora

## 2021-10-14 NOTE — Telephone Encounter (Signed)
Called [facility], MRI is awaiting physician review , no peer to peer request generated yet

## 2021-10-14 NOTE — Progress Notes (Signed)
Barbara Elliott (DOB: 1951/01/31) a female with 55 yrs of life experience, presents in follow-up, for evaluation of the following chief complaint(s):  Referral - General       ASSESSMENT/PLAN:  1. Low back pain radiating to right leg  Assessment & Plan:  Her pain is consistent with radiculopathy.  MRI would be very useful.  While we are waiting for this burst of oral steroids is provided in hopes that it gives her relief.  2. Primary localized osteoarthrosis  Assessment & Plan:  I do feel that her hip arthritis is contributing to the problem.  The reverse Trendelenburg gait is certainly going to increase vertebral motion and potentially irritate the zygapophyseal joints and nerve roots.      Return for Evaluation after MRI.      SUBJECTIVE/OBJECTIVE:  HPI:  She comes in primarily for pain radiating down her right leg.  This began right before she fractured her ankle.  She reports having difficulty to get anyone to pay attention to the pain down her leg.  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  She thinks that the issue started because she was removing wallpaper in her new home.  She started the physical therapy exercises she learned years ago prior to her back surgery.  She is waiting to hear from physical therapy as this was ordered several weeks ago.  She also has an MRI on order, but there is some issue with insurance approval.  She is using over-the-counter medications with minimal help.  Heat and ice have been temporarily helpful.  She describes the leg pain as a burning sensation down the outside of her leg.  There is no numbness or tingling sensations.      When talking about potential treatments, she has had at least one injection her back between her two surgeries which she states was very painful and did not help only aggravated her pain.  For this reason she is reluctant  to consider doing injections once again.    ROS:  She has not noted any weakness.  No bowel or bladder issues.  No fever or chills.  No issues with her GI system cardiac or pulmonary system.    EXAM:  She has a mildly positive right straight leg raise.  She has normal patellar reflexes and left Achilles.  Right Achilles reflexes absent.  There is some inguinal pain with internal rotation of both hips.  She does not have much loss of range of motion of her hips.  She does have increased back pain with lumbar extension.  When she first stood up she had a reverse Trendelenburg and shortened stance time on the right.  After she turned around, pivoting on her left leg, she has shortened stance time and reverse Trendelenburg bearing weight on the left leg.  She was able to toe walk and heel walk.  Single leg standing balance fair to good      Problem List:  Patient Active Problem List   Diagnosis    Encounter for hepatitis C screening test for low risk patient    Hypertension    Acquired hypothyroidism    Preventative health care    Mixed hyperlipidemia    Current every day smoker    Prediabetes    Left hip pain    Encounter for immunization  Encounter for subsequent annual wellness visit (AWV) in Medicare patient    Vitamin D deficiency    Right hip pain    Primary osteoarthritis of right hip    Primary localized osteoarthrosis    Low back pain radiating to right leg        MEDICATIONS:    Current Outpatient Medications:     predniSONE (DELTASONE) 10 MG tablet, 8 tab today, 7 tab tomorrow, then 6 tab, then 5 tab, then 4 tab, then 3 tab, then 2 tab, then 1 tab, Disp: 36 tablet, Rfl: 0    metoprolol succinate (TOPROL XL) 25 MG extended release tablet, TAKE 1 TABLET BY MOUTH EVERY DAY FOR BLOOD PRESSURE, Disp: 90 tablet, Rfl: 1    atorvastatin (LIPITOR) 40 MG tablet, Take 1 tablet by mouth daily, Disp: 90 tablet, Rfl: 3    acetaminophen (TYLENOL) 325 MG tablet, Take by mouth every 4 hours as needed, Disp: , Rfl:      amLODIPine (NORVASC) 10 MG tablet, Take 10 mg by mouth daily, Disp: , Rfl:     aspirin 81 MG chewable tablet, Take 81 mg by mouth daily, Disp: , Rfl:     calcium carbonate 1500 (600 Ca) MG TABS tablet, Take 600 mg by mouth 2 times daily, Disp: , Rfl:     Cholecalciferol 50 MCG (2000 UT) TABS, Take 1 tablet by mouth daily, Disp: , Rfl:     glucosamine-chondroitin 500-400 MG CAPS, Take 1 capsule by mouth daily, Disp: , Rfl:     ibuprofen (ADVIL;MOTRIN) 600 MG tablet, Take by mouth every 6 hours as needed, Disp: , Rfl:     zoster recombinant adjuvanted vaccine (SHINGRIX) 50 MCG/0.5ML SUSR injection, Administer 0.22ml IM injection once now and a second injection in 2-6 months  Indications: shingles vaccination, Disp: , Rfl:     ALLERGIES:  Allergies   Allergen Reactions    Codeine Shortness Of Breath    Lisinopril Cough       This chart was dictated using M Modal, a voice to text software; inherent to using this technology, there may be incorrectly spelled words, or erroneous phrases. Please call the office with any questions or concerns.    Tonita Cong, MD

## 2021-10-16 ENCOUNTER — Inpatient Hospital Stay: Admit: 2021-10-16 | Payer: MEDICARE | Primary: Family

## 2021-10-16 DIAGNOSIS — M5416 Radiculopathy, lumbar region: Secondary | ICD-10-CM

## 2021-10-20 ENCOUNTER — Encounter: Primary: Family

## 2021-10-20 ENCOUNTER — Inpatient Hospital Stay: Admit: 2021-10-20 | Payer: MEDICARE | Primary: Family

## 2021-10-20 DIAGNOSIS — M1611 Unilateral primary osteoarthritis, right hip: Secondary | ICD-10-CM

## 2021-10-20 NOTE — Telephone Encounter (Signed)
Pt stopped by front desk, requested to schedule f/u for mri results. MRI completed 10/16/21 at [facility]. Appt scheduled 10/21/21 at 8:30.    Amy L Jim Desanctis

## 2021-10-20 NOTE — Progress Notes (Addendum)
Provider Number [id number]  Physical Therapy Initial Evaluation  Patient: Barbara Elliott (70 y.o. 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]  Radiculopathy, lumbar region [M54.16]  Treatment Diagnosis: Treatment Diagnosis: impaired ROM, weakness, radiculopathy, impaired gait  Problem Onset date:05/31/21     Medical History   Patient Assessed for Rehabilitation Services: Yes  Self reported health status:: Good  Chart Reviewed: Yes    Medical History:   Past Medical History:   Diagnosis Date    Headache     High blood pressure     High cholesterol     Wears glasses      Surgical History:   Past Surgical History:   Procedure Laterality Date    HYSTERECTOMY (CERVIX STATUS UNKNOWN)  2012    IR CHOLECYSTOSTOMY PERCUTANEOUS COMPLETE  1980    LUMBAR DISCECTOMY  1998    OTHER SURGICAL HISTORY      pelvic floor, mesh and raise bladder       Medication:    Current Outpatient Medications:     predniSONE (DELTASONE) 10 MG tablet, 8 tab today, 7 tab tomorrow, then 6 tab, then 5 tab, then 4 tab, then 3 tab, then 2 tab, then 1 tab, Disp: 36 tablet, Rfl: 0    metoprolol succinate (TOPROL XL) 25 MG extended release tablet, TAKE 1 TABLET BY MOUTH EVERY DAY FOR BLOOD PRESSURE, Disp: 90 tablet, Rfl: 1    atorvastatin (LIPITOR) 40 MG tablet, Take 1 tablet by mouth daily, Disp: 90 tablet, Rfl: 3    acetaminophen (TYLENOL) 325 MG tablet, Take by mouth every 4 hours as needed, Disp: , Rfl:     amLODIPine  (NORVASC) 10 MG tablet, Take 10 mg by mouth daily, Disp: , Rfl:     aspirin 81 MG chewable tablet, Take 81 mg by mouth daily, Disp: , Rfl:     calcium carbonate 1500 (600 Ca) MG TABS tablet, Take 600 mg by mouth 2 times daily, Disp: , Rfl:     Cholecalciferol 50 MCG (2000 UT) TABS, Take 1 tablet by mouth daily, Disp: , Rfl:     glucosamine-chondroitin 500-400 MG CAPS, Take 1 capsule by mouth daily, Disp: , Rfl:     ibuprofen (ADVIL;MOTRIN) 600 MG tablet, Take by mouth every 6 hours as needed, Disp: , Rfl:     zoster recombinant adjuvanted vaccine (SHINGRIX) 50 MCG/0.5ML SUSR injection, Administer 0.57ml IM injection once now and a second injection in 2-6 months  Indications: shingles vaccination, Disp: , Rfl:   Allergies: Codeine and Lisinopril    SUBJECTIVE EXAMINATION   History obtained from:: Patient, Chart Review,      ,  Family/Caregiver Present: No    Subjective History of Present Problem/injury:   Subjective: Started to have R radicular pain similar to sciatica back early June. 2-3 days later she tripped over the dog and fractured her R foot. Was in a CAM boot for awhile plus the ongoing sciatica and chronic hip  pain causing additional pain and worsening gait pattern. Patient has a history of laminectomy 20+ years ago R low back. Patient has had B hip injections have provided little relief. Consult with physiatry and MRI just completed on Thursday last week. Just finished course of prednisone which did help with intensity. MRI + for nerve compression L5-S1.Has relief with ice and heat somtimes and OTC.     Additional Pertinent Hx:(if applicable):     Diagnostic Imaging/Tests:  MRI MRI L5-S1 pressing on nerves    Previous PT/Other Services:      Condition Since Onset:   rapidly worsening  Other Comments:      Patient Reported Goals:   TO reduce radicular pain   Pain Screening:   Pain Screening  Patient Currently in Pain:  (6/10 seated, lowest 2/10, supine 1-2/10 sleeping, worst 9/10)     Functional Status:       Social Hx / Living situation:   Social History  Lives With: Spouse  Type of Home: House  Home Layout: Two level  Home Access: Stairs to enter with rails    Learning/Language: Learning  Does the patient/guardian have any barriers to learning?: No barriers  What is the preferred language of the patient/guardian?: English  Is an interpreter required?: No  How does the patient/guardian prefer to learn new concepts?: Listening, Reading, Demonstration      Occupation/Interests:   Occupation: Retired  Type of Occupation: Charity fundraiser         Prior Level of Function:   no back pain or radicular sx Independent        Previous Activity level:  moderately active,    Current Level of Function:   R leg pain burning/shooting, limited standing/walking/sitting      Receives Help From: Family  ADL Assistance: Independent  Homemaking Assistance: Needs assistance  Active Driver: Yes  Mode of Transportation: Car         OBJECTIVE EXAMINATION:     Lumbar Spine Exam   Restrictions/Precautions:              Systems Review  (as applicable)   Vision/Hearing:     Orientation and Affect:      Alert and oriented x 4   Integumentary system:    Vitals:        Lumbar Spine Supplemental Questions:  (as applicable)        Regional Screen:                                    Observations:  (including posture and alignment)     General Observations  General Observations: Yes  Description: R thoracic shortened and compressed with head righting in sitting/standing/supine, rounded sh forward head    Ambulation/Gait (if applicable):    Moderate antalgia B LE with wide BOS and 'waddle' presentation with guarded trunk movement    Balance Screen:    Fair standing dynamic        Thoracic Assessment             Lumbar Assessment     AROM Lumbar Spine   Flexion: 95  Extension: 5    + pain  Lateral Flexion Right: 35   + pain with exaggerations  Lateral Flexion Left: 45    - pain on R  Right Rotation: 20    + pain    R  Left Rotation: 15     +  tightness       Trunk  Strength     Trunk Strength  Trunk Flexion: 3-/5  Trunk Extension: 3/5  Upper abdominals: Fair  Lower abdominals: Poor  R Internal Obliques: Fair  R External Obliques: Fair  L Internal Obliques: Fair  L External Obliques: Fair     Left AROM  Right AROM                    Left PROM  Right PROM      General PROM LE: Other (comment) (ALL hip movements B hips restricted by > 50% all directions due to chronic OA)    General PROM LE: Other (comment) (ALL hip movements B hips restricted by > 50% all directions due to chronic OA)        Left Strength  Right Strength         Strength LLE  L Hip Flexion: 3/5  L Hip Extension: 3/5  L Hip ABduction: 3/5  L Hip ADduction: 3/5  L Hip Internal Rotation: 3/5  L Hip External Rotation: 3/5  L Knee Flexion: 3/5  L Knee Extension: 3/5  L Ankle Dorsiflexion: 3+/5    Strength RLE  R Hip Flexion: 3+/5  R Hip Extension: 3+/5  R Hip ABduction: 3+/5  R Hip ADduction: 3+/5  R Hip Internal Rotation: 3+/5  R Hip External Rotation: 3+/5  R Knee Flexion: 3+/5  R Knee Extension: 3+/5  R Ankle Dorsiflexion: 3+/5     Muscle Length/Flexibility:      Joint Mobility (if applicable):        Special Tests (if applicable):   Special Tests Lumbar Spine  SLR: R (-)  Squish Test: R (-)    Functional Outcome Measures/questionnaire(s) completed:                                         Oswestry Disability Index (ODI)  Pain Intensity: C. The pain is moderate at the moment  Personal Care [facility], etc[facility]): C. It is painful to look after myself and I am slow and careful  Lifting: E. I can lift very light weights  Sitting: E. Pain prevents me from sitting more than ten minutes  Standing: E. Pain prevents me from standing more than 10 minutes  Sleeping: B. My sleep is occasionally disturbed by pain  Social Life : E. Pain has restricted my social life to my home  Traveling : E. Pain restricts me to short necessary journeys under 30 minutes  Owsestry Disability Total Scores: 25  Oswestry CMS Modifier:  CL  Numbers of Completion: 8  Oswestry Disability Scores %: 62.5     Current Total Disability Percentage: 62.5 % (Date: 10/21/2021)    Interpretation of Score: Higher score indicates symptoms are impacting life and patient is more disabled from back pain.         ASSESSMENT     Impression: Based on today's findings Barbara Elliott (69 y.o. female)   Assessment: Patient presents with joint impairments associated with MRI findings and + nerve compression findings leading to significant pain and functional impairment.    Performance Deficits/impairment List:           Body Structures, Functions, Activity Limitations Requiring Skilled Therapeutic Intervention: Decreased functional mobility , Decreased ROM, Decreased body mechanics, Decreased strength, Decreased endurance    This Patient will benefit from skilled physical therapy services  to address the noted physical impairments, functional limitations, and to increase the likelihood of meeting the related goals stated below.        Patient's Activity Tolerance: Patient limited by pain      Patient's rehabilitation potential/prognosis is considered to be: Good  Factors which may impact rehabilitation potential include:            Eval Complexity:    Decision Making: Medium Complexity  Patient Reported Goals:   3 weeks  Short Term Goals:  by 3 weeks  1) 75% reduction in radicular burning R LE > 80% day for tolerance of HHADL's in am.  2) I with HEP for core stability/flexibility without pain.  3) Initiate soft tissue mobilization PRN/pelvis leveling PRN for alignment to promote healing.  Long Term Goals:  by 6 weeks  1) eliminate radicular pain R LE centralized to lumbar area < 2/10 < 50% time for tolerance to standing.  2) Demo 75% correct body mechanics for spinal protection during HHADL's for reinjury prevention.  3) ODI < 20% impairment for transition to water wellness program.        TREATMENT PLAN   Treatment Plan:  Treatment may include any combination of the  following:      Strengthening, ROM, Balance training, Functional mobility training, Transfer training, Endurance training, Gait training, Neuromuscular re-education, Manual Therapy - Soft Tissue Mobilization, Pain management, Modalities, Home exercise program, Safety education & training, Integrated dry needling, Positioning, Therapeutic activities     Pt. actively involved in establishing Plan of Care and Goals: Yes  Frequency / Duration: Patient to be seen 2 x week time(s) per wk for 6 weeks weeks      Requires PT Follow-Up: Yes        Treatment Included on Day of Evaluation:   Home Exercise Program:  [x]  Issued  [x]  Verbal   [x]  Written Handout []  Return Demonstration    Therapeutic Exercise:8  Supine KTC x 30" B  PPT 10 x 3"   Bridging x 10  Body mechanics supine to sit    Modalities:15  Lumbar traction intermittent 80# pull 50:10 supine at 45 deg x 10 min + 5 min set up    Patient/ Caregiver education and instruction:      traction, HEP, POC, joint protection,         Therapist Signature:    Armida Vickroy M Zailyn Rowser, PT, 10/20/2021 Start Time:  1:00 PM EDT    End Time: 200                                             Evaluation Minutes:  37  Treatment Minutes: 23  Untimed Minutes:  Total Time: 60   Time Breakdown:  Modalities: 15  MT:   TE: 8   TA:   GT:   NMRE          I certify that the above Therapy Services are being furnished while the patient is under my care. I agree with the treatment plan and certify that this therapy is necessary.      Physician's Signature:_______________________________________          Date:________________     Physician Comments: _______________________________________________    Please sign and return to [facility] for Physical Rehabilitation: Fax: [phone].   THANK YOU for this referral!

## 2021-10-21 ENCOUNTER — Encounter

## 2021-10-21 ENCOUNTER — Ambulatory Visit
Admit: 2021-10-21 | Discharge: 2021-10-21 | Payer: MEDICARE | Attending: Physical Medicine & Rehabilitation | Primary: Family

## 2021-10-21 DIAGNOSIS — M5416 Radiculopathy, lumbar region: Secondary | ICD-10-CM

## 2021-10-21 NOTE — Patient Instructions (Signed)
You will be scheduled for : Epidural Steroid    Please read the following instructions to help you prepare for your upcoming procedure:    Procedures are done in the Outpatient Department at [facility], located at [address] in [place].  You will be screened upon entering the hospital and are required to wear a mask for your visit. Visitors may not be allowed, depending on the current pandemic-related restrictions.  Once in the building, you will go to the Outpatient department on the second floor.    To expedite the registration process and allow [place] to maintain social distancing, the registrant may contact you the day prior to your injection to update your insurance information, obtain consent for treatment, and collect a co-pay if applicable. Please arrive at your scheduled time to allow for registration and initial assessment.  If you arrive later than your scheduled time, your procedure may have to be rescheduled.    You will receive a call from our office to schedule this injection.  You will be given your arrival time either in the office or during that call.  PLEASE DISREGARD ANY OTHER NOTIFICATION OF A TIME OTHER THAN THE ARRIVAL TIME GIVEN TO YOU BY OUR OFFICE.     If you are diabetic, your sugar will be checked before the procedure.  If it is over 300, the procedure will need to be rescheduled.    If you are on aspirin or a blood thinner, it may need to be stopped before your procedure.  Our clinical team will tell you how far in advance to stop your blood thinner, if needed.    If you are on Coumadin/Warfarin and it needs to be stopped for the procedure, you will be arriving in the outpatient department 1 hour before your procedure for an INR check.    YOU ARE NOT REQUIRED TO HAVE A DRIVER FOR THIS PROCEDURE.    Dye Allergy:   Remember to inform us if you have ever had an allergy to contrast dye or iodine.    You may need medication prior to your injection to prevent any reaction.    Medications used for confirmed contrast allergy:     * 24 hours pre-procedure: Prednisone 40mg  by mouth (will need to be prescribed by the office)  THEN  * 1-4 hours pre-procedure: Prednisone 40 mg by mouth (will need to be prescribed by the office)  AND  * 1-2 hours pre-procedure: Famotidine (Pepcid) 40mg  by mouth  AND  * 1-2 hours pre-procedure: Cetirizine (Zyrtec) 10mg  by mouth or diphenhydramine (Benadryl) 25 by mouth    Day of Procedure:   1. Please bring a current medication list with you. Please bring minimal belongings and limit jewelry.  2. A driver will be required if you need any medication for the procedure to help you relax.   3. You will be given a follow up appointment after procedure.     Please call the Physiatry office with any questions. 

## 2021-10-21 NOTE — Assessment & Plan Note (Signed)
At this point she is to continue physical therapy.  She is set up for an epidural steroid injection, but in the past she did not do well with this and did very well with surgery.  She leans towards surgery at this time for her treatment.  A referral is set up with the surgeon.

## 2021-10-21 NOTE — Progress Notes (Signed)
Barbara Elliott (DOB: 1951-03-05) a female with 27 yrs of life experience, presents in follow-up, for evaluation of the following chief complaint(s):  Follow-up       ASSESSMENT/PLAN:  1. Lumbar radiculopathy  Assessment & Plan:  At this point she is to continue physical therapy.  She is set up for an epidural steroid injection, but in the past she did not do well with this and did very well with surgery.  She leans towards surgery at this time for her treatment.  A referral is set up with the surgeon.  Orders:  -     Ambulatory referral to Orthopedic Surgery  -     Amb Referral to Outpatient Department      Return for consult with [facility].      SUBJECTIVE/OBJECTIVE:  HPI:  She comes in with back pain radiating down right leg.  She did note some relief of her pain early on with the steroids but the pain seems to be coming back now that she is pretty much done with them.  She does find that physical therapy and the traction helped, as she slept better after her first session yesterday.  She is here to review the MRI.  This does show a significant disc bulging into the lateral recess impinging upon the right S1 nerve root.  This appears to be in the same area as she had a previous laminectomy.  We discuss treatment options.  Years ago, she had an epidural injection in [place] and this made her right leg numb for a couple days and then the pain came right back.  She ended up having surgery which gave her immediate relief.  For this reason, she leans towards surgery once again.      ROS:  No saddle anesthesia, no bowel or bladder issues.      EXAM:  She continues to have a mild reverse Trendelenburg gait on the right side.  Repeated toe rises on the right are more difficult than the left.  Straight leg raise on the right side is mildly positive.      Problem List:  Patient Active Problem List   Diagnosis    Encounter for hepatitis C screening test for low risk patient    Hypertension    Acquired hypothyroidism     Preventative health care    Mixed hyperlipidemia    Current every day smoker    Prediabetes    Left hip pain    Encounter for immunization    Encounter for subsequent annual wellness visit (AWV) in Medicare patient    Vitamin D deficiency    Right hip pain    Primary osteoarthritis of right hip    Primary localized osteoarthrosis    Low back pain radiating to right leg    Lumbar radiculopathy        MEDICATIONS:    Current Outpatient Medications:     predniSONE (DELTASONE) 10 MG tablet, 8 tab today, 7 tab tomorrow, then 6 tab, then 5 tab, then 4 tab, then 3 tab, then 2 tab, then 1 tab, Disp: 36 tablet, Rfl: 0    metoprolol succinate (TOPROL XL) 25 MG extended release tablet, TAKE 1 TABLET BY MOUTH EVERY DAY FOR BLOOD PRESSURE, Disp: 90 tablet, Rfl: 1    atorvastatin (LIPITOR) 40 MG tablet, Take 1 tablet by mouth daily, Disp: 90 tablet, Rfl: 3    acetaminophen (TYLENOL) 325 MG tablet, Take by mouth every 4 hours as needed, Disp: , Rfl:  amLODIPine (NORVASC) 10 MG tablet, Take 10 mg by mouth daily, Disp: , Rfl:     aspirin 81 MG chewable tablet, Take 81 mg by mouth daily, Disp: , Rfl:     calcium carbonate 1500 (600 Ca) MG TABS tablet, Take 600 mg by mouth 2 times daily, Disp: , Rfl:     Cholecalciferol 50 MCG (2000 UT) TABS, Take 1 tablet by mouth daily, Disp: , Rfl:     glucosamine-chondroitin 500-400 MG CAPS, Take 1 capsule by mouth daily, Disp: , Rfl:     ibuprofen (ADVIL;MOTRIN) 600 MG tablet, Take by mouth every 6 hours as needed, Disp: , Rfl:     zoster recombinant adjuvanted vaccine (SHINGRIX) 50 MCG/0.5ML SUSR injection, Administer 0.91ml IM injection once now and a second injection in 2-6 months  Indications: shingles vaccination, Disp: , Rfl:     ALLERGIES:  Allergies   Allergen Reactions    Codeine Shortness Of Breath    Lisinopril Cough     LEVEL OF SERVICE BASED ON TIME:  I personally spent a total of 20 minutes on today's visit doing chart preparation, review of previous records/labs/imaging,  performing a medically appropriate evaluation, counseling and educating the patient, and documenting clinical information in the electronic health record.      This chart was dictated using M Modal, a voice to text software; inherent to using this technology, there may be incorrectly spelled words, or erroneous phrases. Please call the office with any questions or concerns.    Tonita Cong, MD

## 2021-10-23 ENCOUNTER — Encounter: Attending: Neurology | Primary: Family

## 2021-10-23 ENCOUNTER — Encounter

## 2021-10-24 ENCOUNTER — Inpatient Hospital Stay: Admit: 2021-10-24 | Payer: MEDICARE | Primary: Family

## 2021-10-24 ENCOUNTER — Ambulatory Visit: Admit: 2021-10-24 | Discharge: 2021-10-24 | Payer: MEDICARE | Attending: Orthopaedic Surgery | Primary: Family

## 2021-10-24 DIAGNOSIS — M5431 Sciatica, right side: Secondary | ICD-10-CM

## 2021-10-24 NOTE — Progress Notes (Signed)
Provider Number [id number]  Physical Therapy Treatment Note  Patient: Barbara Elliott (70 y.o. 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 No           HEP Compliance: Yes, Good      Pain Level:  2/10 initially before appt downstairs, now a 4/10     Patient Comments:   "It definitely felt better after last time. It wasn't exactly immediate but I felt improvement."  Condition improvement: improved     Objective:    Restrictions/Precautions: No data recorded      Observations/Activities, tests and measures:   Treatment:      Therapeutic Exercise:     Supine KTC x 30" B  PPT 10 x 3"  Bridging x 10  Body mechanics supine to sit  LTR ea side 10x alternating    Modalities:15  Lumbar traction intermittent 80# pull 50:10 supine at 45 deg x 15 min + 5 min set up    Patient/ Caregiver Education and Instruction:   activity modification and exercises, mechanics for sit to supine    Home Exercise Program: HEP was reviewed with Patient    Assessment:    Post-Treatment Pain Level:   4/10, maybe a little higher  Patient's Activity Tolerance:     excellent  tolerated the activities well with no complications.  Overall progress is considered  to be: Good   Patient will continue to benefit from skilled PT services.     Some increased soreness following TX, advised this could be length of time placed in position as well. Some increased discomfort and decreased ROM when bringing knees to L for LTR. Cueing needed for proper mechanics sit to supine, did well with supine to sit    Conditions requiring continued skilled PT services: Body Structures, Functions, Activity Limitations Requiring Skilled Therapeutic Intervention: Decreased functional mobility ; Decreased ROM; Decreased body mechanics; Decreased strength; Decreased endurance      Factors which may impact rehabilitation potential include:       Plan:   Plan Frequency: 2 x week  Plan weeks: 6 weeks  Current Treatment Recommendations: Strengthening; ROM; Balance training; Functional mobility training; Transfer training; Endurance training; Investment banker, operational; Neuromuscular re-education; Manual Therapy - Soft Tissue Mobilization; Pain management; Modalities; Home exercise program; Safety education & training; Integrated dry needling; Positioning; Therapeutic activities      Continue Therapy current plan of care      Therapist Electronic Signature:    Denita Lung, PTA, 10/24/2021 Start Time: 10:00 AM EDT    End Time: 10:43   Timed Minutes:  43  Untimed Minutes:   Total Treatment  Time: 43   Time Breakdown:  Modalities: 20  MT:     TE: 23    TA:    GT:    NMRE

## 2021-10-24 NOTE — Progress Notes (Signed)
CC: Back Pain (Lumbar radiculopathy)      HPI: The patient is a 70 y.o.-year-old female who presents today for their first orthopaedic spine evaluation. The patient describes having symptoms similar to the one she have now about 20 years ago when she underwent a diskectomy.  She has done well, and in June of this year she developed fairly sudden onset of right leg pain.  After that, she also had a fracture of her right ankle, and was treated conservatively.  She had difficulty mobilizing, but the ankle pain has since resolved.  Due the radiating pain she was evaluated and found to have a disc herniation, and she is now here for surgical consultation.    Currently the back pain is not a significant issue.  She does not describe chronic history of back pain but rather pain in her right leg.  At rest it is at about 2/10, but if she tries to stand or walk for any distance, the pain increases.  The leg pain is better today than it was a week ago, which she really could walking all the way from the parking lot.  Today that was not an issue.  She describes the pain starting in her right buttock going down towards her calf.  Standing still is more painful than walking, but sitting is least uncomfortable.  If she takes some Tylenol and ibuprofen and lays down, her pain is pretty much minimal.  She does not describe specific numbness tingling or weakness.  No bowel or bladder issues.  No left-sided symptoms.    Studies include lumbar MRI.    The treatment so far has included  medications, activity modifications, as well as stretching.  She just started physical therapy about a week ago.  She has had injections in the past, and is reluctant to consider that.    No new injury. No recent illness, fevers, chills or night sweats. There is no significant weight loss, night pain, bowel or bladder changes, or new traumatic injury.     Past Medical History:   Diagnosis Date    Headache     High blood pressure     High cholesterol      Wears glasses        Current Outpatient Medications:     metoprolol succinate (TOPROL XL) 25 MG extended release tablet, TAKE 1 TABLET BY MOUTH EVERY DAY FOR BLOOD PRESSURE, Disp: 90 tablet, Rfl: 1    atorvastatin (LIPITOR) 40 MG tablet, Take 1 tablet by mouth daily, Disp: 90 tablet, Rfl: 3    acetaminophen (TYLENOL) 325 MG tablet, Take by mouth every 4 hours as needed, Disp: , Rfl:     amLODIPine (NORVASC) 10 MG tablet, Take 10 mg by mouth daily, Disp: , Rfl:     aspirin 81 MG chewable tablet, Take 81 mg by mouth daily, Disp: , Rfl:     calcium carbonate 1500 (600 Ca) MG TABS tablet, Take 600 mg by mouth 2 times daily, Disp: , Rfl:     Cholecalciferol 50 MCG (2000 UT) TABS, Take 1 tablet by mouth daily, Disp: , Rfl:     glucosamine-chondroitin 500-400 MG CAPS, Take 1 capsule by mouth daily, Disp: , Rfl:     ibuprofen (ADVIL;MOTRIN) 600 MG tablet, Take by mouth every 6 hours as needed, Disp: , Rfl:     zoster recombinant adjuvanted vaccine [facility]) 50 MCG/0.5ML SUSR injection, Administer 0.70ml IM injection once now and a second injection in 2-6 months  Indications: shingles  vaccination (Patient not taking: Reported on 10/24/2021), Disp: , Rfl:   Allergies   Allergen Reactions    Codeine Shortness Of Breath    Lisinopril Cough     Past Surgical History:   Procedure Laterality Date    HYSTERECTOMY (CERVIX STATUS UNKNOWN)  2012    IR CHOLECYSTOSTOMY PERCUTANEOUS COMPLETE  1980    LUMBAR DISCECTOMY  1998    OTHER SURGICAL HISTORY      pelvic floor, mesh and raise bladder     Family History   Problem Relation Age of Onset    Other Sister         prediabetic    Obesity Sister     No Known Problems Brother     Mastectomy Maternal Grandmother 77        breast cancer    No Known Problems Maternal Grandfather     No Known Problems Paternal Grandmother     Diabetes Paternal Grandfather     Osteoarthritis Brother 60        2 knee replacement    No Known Problems Brother     Uterine Cancer Neg Hx     Ovarian Cancer Neg Hx      Hypertension Mother     Diabetes Father     Colon Cancer Sister 30        colostomy     Social History     Socioeconomic History    Marital status: Married     Spouse name: Not on file    Number of children: Not on file    Years of education: ADN    Highest education level: Not on file   Occupational History    Not on file   Tobacco Use    Smoking status: Some Days     Packs/day: 0.25     Types: Cigarettes    Smokeless tobacco: Never   Substance and Sexual Activity    Alcohol use: Yes     Alcohol/week: 1.0 standard drink    Drug use: Never    Sexual activity: Not on file   Other Topics Concern    Not on file   Social History Narrative    y day.  Her husband does smoke cigars.  She does not use illicit drugs.  She does drink wine 1-2 glasses at night.    She enjoys her time with grand children, reading.    Wears a seat belt in a car.          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 Married to [employer].  4 children together:  - Sean 1970   -Adam 1972 Grand child- 1 boy  -Lyla Son 1975 Grand child -1 girl  -[name] [phone] - 3 girls    She does currently smoke cigarettes - 1/2 a pack a week, not ever     Social Determinants of Health     Financial Resource Strain: Not on file   Food Insecurity: Not on file   Transportation Needs: Not on file   Physical Activity: Not on file   Stress: Not on file   Social Connections: Not on file   Intimate Partner Violence: Not on file   Housing Stability: Not on file       ROS: A 12-point review of systems was reviewed with the patient and is available in further detail in the chart.        Examination:   There were no vitals taken for this visit.  Wt Readings from Last 3 Encounters:   10/21/21 150 lb (68 kg)   10/14/21 150 lb (68 kg)   09/22/21 150 lb (68 kg)       The patient is awake, alert and oriented times 3 and answering questions appropriately. The patient is atraumatic, normocephalic, extraocular muscles are intact, affect is friendly and breathing is unlaborded.  She is in mild  distress.  She tolerates examination well.  She is mildly deconditioned and overweight.    Examination of the spine shows good overall alignment with mild loss of lordosis.  She has decreased extension and flexion, with pulling towards the right buttock.  No specific instability catch.    Neurologic examination show negative straight leg raise bilaterally.  She is globally hyporeflexic, with in tacked sensation to light touch.  She has good strength by manual testing, and by toe and heel walk.       Lower extremity evaluation show good gentle range of motion with negative Stinchfield and Patrick's test.    Vascular evaluation show palpable pulses, without significant edema.    Radiographic examination:     MRI of the lumbar spine [facility] dated 10/16/2021 was reviewed.  This shows some global degenerative changes in the thoracolumbar junction down to L4, there is sparing of the L4-5 disc.  At L5-S1, there are postsurgical changes on the right side consistent with a diskectomy.  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  There is significant L5-S1 disc space narrowing, but no severe foraminal stenosis.  No other nerve compromise is noted.  No signs of instability.    IMPRESSION:      ICD-10-CM    1. Sciatica of right side  M54.31       2. History of lumbar discectomy  Z98.890       3. 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  We discussed her diagnosis and the natural history, and that hopefully she will continue to improve.  As long as this no progression, I do not think there is any reason for surgical intervention or steroid injection, some that she is not willing to have at this point.  I recommended she return to physical therapy and continue the program, and ultimately will should maximize her pain control as well as her function.      Should she describes significant increase in her pain, numbness, or  weakness, I think surgical intervention would be recommended.  She is very comfortable this information.     PLAN:  Follow-up as needed.      Progress with physical therapy as tolerated.    I reviewed my evaluation at length with the patient.  They are comfortable with the information. Their questions were answered to their satisfaction.  They do know to contact us regarding any questions, problems, or significant change in their symptoms as discussed.    I personally spent a total of 33 minutes on today's visit doing chart preparation, review of previous records and labs, performing a medically appropriate evaluation, counseling and educating the patient and documenting clinical information in the electronic health record.     Thank you for allowing me to participate the care your patient. Please do not hesitate to contact my office if we can be of further assistance.    Sincerely,    Clotile Whittington MD  No orders of the defined types were placed in this encounter.

## 2021-10-28 ENCOUNTER — Inpatient Hospital Stay: Admit: 2021-10-28 | Payer: MEDICARE | Primary: Family

## 2021-10-28 DIAGNOSIS — M1611 Unilateral primary osteoarthritis, right hip: Secondary | ICD-10-CM

## 2021-10-28 NOTE — Progress Notes (Signed)
Provider Number [phone]  Physical Therapy Treatment Note  Patient: Barbara Elliott (70 y.o. 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 No           HEP Compliance: Yes, Good      Pain Level:  "It's been ok"      Patient Comments:   "It didn't feel quite as good after traction last time."  Condition improvement: improved overall    Objective:    Restrictions/Precautions: No data recorded      Observations/Activities, tests and measures:       Treatment:      Therapeutic Exercise:        Supine KTC x 30" B  PPT 10 x 3" legs fully extended   Bridging 2 x 10  Supine resisted hip flexion 1x10 alternating  Hamstring stretch 1x20"  Body mechanics supine to sit       Modalities:20  Lumbar traction intermittent 80# pull 50:10 supine at 45 deg x 15 min + 5 min set up    Patient/ Caregiver Education and Instruction:   exercises, hold on LTR    Home Exercise Program: HEP was reviewed with Patient    Assessment:    Post-Treatment Pain Level:   "spasming and aching"  Patient's Activity Tolerance:     fair  tolerated the activities well with no complications.  Overall progress is considered to be: Good   Patient will continue to benefit  from skilled PT services.     Some increased cramping post session today. Increased irritation with flexion positioning, more comfortable legs extended for PPT    Conditions requiring continued skilled PT services: Body Structures, Functions, Activity Limitations Requiring Skilled Therapeutic Intervention: Decreased functional mobility ; Decreased ROM; Decreased body mechanics; Decreased strength; Decreased endurance      Factors which may impact rehabilitation potential include:       Plan:   Plan Frequency: 2 x week  Plan weeks: 6 weeks  Current Treatment Recommendations: Strengthening; ROM; Balance training; Functional mobility training; Transfer training; Endurance training; Investment banker, operational; Neuromuscular re-education; Manual Therapy - Soft Tissue Mobilization; Pain management; Modalities; Home exercise program; Safety education & training; Integrated dry needling; Positioning; Therapeutic activities        Continue Therapy current plan of care      Therapist Electronic Signature:    Denita Lung, PTA, 10/28/2021 Start Time:  2:00 PM EDT    End Time: 2:40   Timed Minutes:  40  Untimed Minutes:   Total Treatment Time: 40   Time Breakdown:  Modalities: 20  MT:     TE: 20  TA:    GT:    NMRE

## 2021-10-29 NOTE — Telephone Encounter (Signed)
Referral from Dr. Sherron Flemings for [facility] L5-S1 IESI.  Called out to patient to schedule.  Patient stated she did not think they were going in that direction.  I asked what her understanding of the plan was and she stated that she would do PT and have a surgical consult and that injection was a possibility but they were not pursuing that right now.    Advised I have an order in to schedule her and apologized if that order should not have been placed.  Advised I can send note to provider.    Patient asked how far out we are booking.  Advised next openings are week of Nov 15th.    Patient asked that note go to provider stating she will think about injection and call back if she decides to schedule.

## 2021-10-30 NOTE — Telephone Encounter (Signed)
Per Dr. Sherron Flemings, if patient calls in within 2 months to sched we can use current referral.  If it is past the 40-month mark, she will need to be seen in office again prior to scheduling.  Referral has been given a 56-month expiration date.

## 2021-10-31 ENCOUNTER — Inpatient Hospital Stay: Admit: 2021-10-31 | Payer: MEDICARE | Primary: Family

## 2021-10-31 NOTE — Progress Notes (Signed)
Provider Number [phone]  Physical Therapy Treatment Note  Patient: Barbara Elliott (70 y.o. 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 Yes: increased tylenol intake           HEP Compliance: Yes, Good      Pain Level:  "It was a 5 or 6/10 the other day after PT."     Patient Comments:   "It's not so bad right now but I have noticed increased need for tylenol during the night which it hasn't done in a while."  Condition improvement: stable     Objective:    Restrictions/Precautions: No data recorded      Observations/Activities, tests and measures:       Treatment:        Therapeutic Exercise:        Supine KTC x 30" B  PPT 10 x 3" legs fully extended   Bridging 2 x 10  Supine resisted hip flexion 2x10 alternating*  Hamstring stretch 1x20"  Modified childs pose 3x10" both ways *  Hip flexon stretch standing*    MT:  Piriformis stretch  Fig 4 stretch  ER with foot flat for groin stretch  Hamstring stretch 2x30"  Gentle nerve floss 5x with DF  STM R piriformis and SI area in sidelying  It band stretch in sidelying    Patient/ Caregiver Education and Instruction:   Stretching, added HEP exercises    Home Exercise Program: HEP  was progressed or revised, added * TE to HEP    Assessment:    Post-Treatment Pain Level:   "Feels ok"  Patient's Activity Tolerance:     good  tolerated the activities well with no complications.  Overall progress is considered to be: Fair   Patient will continue to benefit from skilled PT services.     Traction not having any reduction in severity, frequrcny or duration Sx therefore discontinued. Did respond well to added stretching and soft tissue work. Will need to be monitored to see effectiveness of change    Conditions requiring continued skilled PT services: Body Structures, Functions, Activity Limitations Requiring Skilled Therapeutic Intervention: Decreased functional mobility ; Decreased ROM; Decreased body mechanics; Decreased strength; Decreased endurance      Factors which may impact rehabilitation potential include:       Plan:   Plan Frequency: 2 x week  Plan weeks: 6 weeks  Current Treatment Recommendations: Strengthening; ROM; Balance training; Functional mobility training; Transfer training; Endurance training; Investment banker, operational; Neuromuscular re-education; Manual Therapy - Soft Tissue Mobilization; Pain management; Modalities; Home exercise program; Safety education & training; Integrated dry needling; Positioning; Therapeutic activities  Continue Therapy current plan of care      Therapist Electronic Signature:    Denita Lung, PTA, 10/31/2021 Start Time: 10:00 AM EDT    End Time: 10:45   Timed Minutes:  45  Untimed Minutes:   Total Treatment Time: 45   Time Breakdown:  Modalities:   MT: 20   TE: 25    TA:    GT:    NMRE

## 2021-11-03 ENCOUNTER — Encounter

## 2021-11-04 ENCOUNTER — Ambulatory Visit
Admit: 2021-11-04 | Discharge: 2021-11-04 | Payer: MEDICARE | Attending: Student in an Organized Health Care Education/Training Program | Primary: Family

## 2021-11-04 ENCOUNTER — Inpatient Hospital Stay: Admit: 2021-11-04 | Payer: MEDICARE | Primary: Family

## 2021-11-04 ENCOUNTER — Inpatient Hospital Stay: Admit: 2021-11-04 | Discharge: 2021-11-04 | Payer: MEDICARE | Primary: Family

## 2021-11-04 DIAGNOSIS — M25571 Pain in right ankle and joints of right foot: Secondary | ICD-10-CM

## 2021-11-04 NOTE — Progress Notes (Addendum)
Provider Number [id number]  Physical Therapy Treatment Note  Patient: Barbara Elliott (70 y.o. 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 No           HEP Compliance: Yes, Good      Pain Level:  6/10, after tylenol its a little bit better      Patient Comments:   "Doesn't seem to be improving, shopping after last time was bad, not a big store but very limited. PPT felt decent recently. Traction and soft tissue both seem equally unhelpful."  Condition improvement: not changed     Objective:    Restrictions/Precautions: No data recorded      Observations/Activities, tests and measures:       Treatment:      Therapeutic Exercise:     SKTC stretch 2x30"  Supine marches with abdominal brace 2x10  Hamstring stretch 2x30"  PPT 2x10  Hip flexor stretch 2x30"  Childs pose 2x30" L, 1x30" R    Modalities:    Interferential current electrical stimulation therapy in order to reduce pain, and/or muscle spasm, using 2 channels. Parameters:  location: lumbar @ output intensity 8 for duration 2 minutes with 5 minutes for preparation/education.       Patient/ Caregiver Education and Instruction:   electrical stim, contraindications, pain  control, use of heat accompanied with E stim    Home Exercise Program: HEP was reviewed with Patient    Assessment:    Post-Treatment Pain Level:     Patient's Activity Tolerance:     no current progress   tolerated the activities well with no complications.  Overall progress is considered to be: Slow   Patient will continue to benefit from skilled PT services.     4/10 following todays changes in treatment with STIM. Will assess effect in the next two days following appt    Conditions requiring continued skilled PT services: Body Structures, Functions, Activity Limitations Requiring Skilled Therapeutic Intervention: Decreased functional mobility ; Decreased ROM; Decreased body mechanics; Decreased strength; Decreased endurance    Factors which may impact rehabilitation potential include:       Plan:   Plan Frequency: 2 x week  Plan weeks: 6 weeks  Current Treatment Recommendations: Strengthening; ROM; Balance training; Functional mobility training; Transfer training; Endurance training; Investment banker, operational; Neuromuscular re-education; Manual Therapy - Soft Tissue Mobilization; Pain management; Modalities; Home exercise program; Safety education & training; Integrated dry needling; Positioning; Therapeutic activities        Continue Therapy current plan of care      Therapist Electronic Signature:    Denita Lung, PTA, 11/04/2021 Start Time: 9:20  End Time:    Timed Minutes:  40  Untimed Minutes:   Total Treatment Time: 40   Time Breakdown:  Modalities: 17  MT:     TE: 23    TA:    GT:    NMRE

## 2021-11-04 NOTE — Progress Notes (Signed)
FOLLOW-UP VISIT  ______________________________________________________________________    Name: Barbara Elliott  DOB: [birth year]  Today's Date: 11/04/2021  Last Visit: 09/26/2021    Chief Complaint/Diagnosis:  Chief Complaint   Patient presents with    Follow-up     Right foot/ankle       ______________________________________________________________________    HPI:  Barbara Elliott is a very pleasant 70 y.o. female who presents for right ankle fracture care. Original injury on 06/05/21. Patient is in a regular shoe, denies pain and swelling. Denies any issues with her right ankle.  ______________________________________________________________________    PMHx:   Past Medical History:   Diagnosis Date    Headache     High blood pressure     High cholesterol     Wears glasses        PSgHx:   Past Surgical History:   Procedure Laterality Date    HX HYSTERECTOMY  2012    HX LUMBAR DISKECTOMY  1998    HX OTHER SURGICAL      pelvic floor, mesh and raise bladder    IR CHOLECYSTOSTOMY PERCUTANEOUS  1980       ALLERGIES:   Allergies   Allergen Reactions    Codeine Shortness of Breath    Lisinopril Cough       MEDS:   Current Outpatient Medications:     metoprolol succinate (TOPROL-XL) 25 mg XL tablet, TAKE 1 TABLET BY MOUTH EVERY DAY FOR BLOOD PRESSURE, Disp: 30 Tablet, Rfl: 1    aspirin 81 mg chewable tablet, Take 81 mg by mouth in the morning., Disp: , Rfl:     glucosamine-chondroitin (ARTHX) 500-400 mg cap, Take 1 Capsule by mouth daily., Disp: , Rfl:     acetaminophen (TYLENOL) 325 mg tablet, Take  by mouth every four (4) hours as needed for Pain., Disp: , Rfl:     ibuprofen (MOTRIN) 600 mg tablet, Take  by mouth every six (6) hours as needed for Pain., Disp: , Rfl:     Shingrix, PF, 50 mcg/0.5 mL susr injection, Administer 0.55ml IM injection once now and a second injection in 2-6 months  Indications: shingles vaccination, Disp: 0.5 mL, Rfl: 1    amLODIPine (NORVASC) 10 mg tablet, Take 1 Tablet by mouth daily., Disp: 90  Tablet, Rfl: 3    atorvastatin (LIPITOR) 40 mg tablet, Take 1 Tablet by mouth daily., Disp: , Rfl:     multivitamin (ONE A DAY) tablet, Take 1 Tablet by mouth daily., Disp: , Rfl:     cholecalciferol, vitamin D3, 50 mcg (2,000 unit) tab, Take 1 Tablet by mouth daily., Disp: , Rfl:     calcium carbonate (CALTREX) 600 mg calcium (1,500 mg) tablet, Take 600 mg by mouth two (2) times a day., Disp: , Rfl:     SHx:   Social History     Socioeconomic History    Marital status: MARRIED     Spouse name: [name]    Number of children: 4    Years of education: ADN    Highest education level: Not on file   Occupational History    Occupation: Retired     Comment: Engineer, civil (consulting)-   Tobacco Use    Smoking status: Some Days     Packs/day: 0.25     Years: 40.00     Pack years: 10.00     Types: Cigarettes    Smokeless tobacco: Never    Tobacco comments:     2 a day   Vaping Use  Vaping Use: Never used   Substance and Sexual Activity    Alcohol use: Yes     Alcohol/week: 1.0 standard drink     Types: 1 Glasses of wine per week     Comment: 1-2 glasses per night    Drug use: Never    Sexual activity: Yes     Partners: Male     Birth control/protection: Surgical   Other Topics Concern    Not on file   Social History Narrative    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 Married to [employer].    4 children together:    - Barbara Elliott 1970     -Barbara Elliott 1972 Grand child- 1 boy    -Barbara Elliott 1975 Grand child -1 girl    -Barbara Elliott (Barbara Elliott) [phone] - 3 girls        She does currently smoke cigarettes - 1/2 a pack a week, not every day.    Her husband does smoke cigars.    She does not use illicit drugs.    She does drink wine 1-2 glasses at night.        She enjoys her time with grand children, reading.        Wears a seat belt in a car.             Social Determinants of Health     Financial Resource Strain: Low Risk     Difficulty of Paying Living Expenses: Not hard at all   Food Insecurity: No Food Insecurity    Worried About Programme researcher, broadcasting/film/video in the Last Year: Never true     Barista in the Last Year: Never true   Transportation Needs: Not on file   Physical Activity: Not on file   Stress: Not on file   Social Connections: Not on file   Intimate Partner Violence: Not on file   Housing Stability: Not on file       FHx:   Family History   Problem Relation Age of Onset    Hypertension Mother     Diabetes Father     Colon Cancer Sister 30        colostomy    Other Sister         prediabetic    Obesity Sister     No Known Problems Brother     Mastectomy Maternal Grandmother 19        breast cancer    No Known Problems Maternal Grandfather     No Known Problems Paternal Grandmother     Diabetes Paternal Grandfather     OSTEOARTHRITIS Brother 60        2 knee replacement    No Known Problems Brother     Uterine Cancer Neg Hx     Ovarian Cancer Neg Hx      ______________________________________________________________________    ROS  General: no fevers, night sweats, unexplained weight loss  Psychiatric: no suicidal ideation  Neurologic: no headaches, loss of consciousness or change in vision  Cardiovascular: no chest pain  Respiratory: no shortness of breath  GI: no abdominal pain, no ulcers  Skin: no infections, no rashes  Hematologic: no bleeding diatheses  Musckuloskeletal:   Chief Complaint   Patient presents with    Follow-up     Right foot/ankle       ______________________________________________________________________    PHYSICAL EXAM:right foot and ankle  There were no vitals taken for this visit.  @PATIENTWT @  There is no height  or weight on file to calculate BMI.    General  The patient is a well-appearing 70 y.o. female who appears her stated age. she is alert and oriented x3, pleasant, conversive, interactive and appropriate.    Gait  Antalgic (from lower back pain)    Skin  Foot/Ankle:  Intact, no abrasions or open wounds.    Inspection/Palpation  Bilateral pes planovalgus  No tenderness to palpation along the peroneal tendon, anterior tibiotalar joint, or lateral  malleolus  Full ROM of ankle and midfoot  Negative anterior drawer    Vascular  The foot is warm and well-perfused. 2+ dorsalis pedis pulse. Capillary refill is brisk to all digits.    Sensory  Sensation is intact to light touch in all 5 nerve distributions to the foot.    Motor  Fires extensor hallucis longus, flexor hallucis longus, extensor digitorum, flexor digitorum, anterior tibialis, gastrocnemius.    ______________________________________________________________________    IMAGING:  Right ankle: 2-view weightbearing xrays were taken today, mortise and lateral. Images were independently visualized and interpreted by myself.  No soft tissue or bony masses are noted.  There is no soft tissue swelling.  Evidence of previous comminuted distal fibula fracture with solid bone healing. There is anatomic alignment of the ankle mortise and syndesmosis.  Symmetric joint spaces are present.  There are no degenerative changes in the tibiotalar, distal tibia-fibula and subtalar joints.   ______________________________________________________________________    IMPRESSION:  Encounter Diagnoses     ICD-10-CM ICD-9-CM   1. Closed fracture of distal end of right fibula with routine healing, unspecified fracture morphology, subsequent encounter  S82.831D V54.16   2.      3.      4.        ______________________________________________________________________    PLAN:  Patient continues to do well, has no pain or issues with her right ankle. She may return to all normal activities. She will follow up as her symptoms dictate. Patient understands and is in agreement with this plan.    FOLLOW-UP:PRN    IMAGING NEXT VISIT: No    Disclaimer:  Portion of this document may contain text elements generated through computerized voice recognition.  Undetected computer generated word errors are possible. Please notify the signing provider or return the document to [facility] Medical Records Department if  clarification or correction is needed.  ______________________________________________________________________  11:20 AM, 11/04/2021

## 2021-11-06 ENCOUNTER — Inpatient Hospital Stay: Admit: 2021-11-06 | Payer: MEDICARE | Primary: Family

## 2021-11-06 NOTE — Progress Notes (Signed)
Provider Number [id number]  Physical Therapy Treatment Note  Patient: Barbara Elliott (70 y.o. 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           HEP Compliance: Yes stable, no worse     Pain Level:  3/10 current pretty good day      Patient Comments:   Was very flared after PT last time. Seems to be worse with other activities tried, modalities.  No follow up with Dr. Rose Phi scheduled. He had offered possible injection vs surgery but hopeful things were improving with PT. Did have some improvement with steroid but this wore off after stopping meds.  Condition improvement: stable no better    Objective:    Restrictions/Precautions: No data recorded        Treatment:      Therapeutic Exercise:       Review current HEP, technique, joint position, joint protection  Patient/ Caregiver Education and Instruction:   activity modification and exercises adjusted technique with supine hip flexor iso instead of seated, angle R thoracic stretch > L to 'open' space, seated calf st instead of standing to avoid spinal extension, POC,     Home Exercise Program: HEP was progressed or revised, Written handout , and Verbal  discussion/Education    Assessment:    Post-Treatment Pain Level:   3/10  Patient's Activity Tolerance:     poor  tolerated the activities well with no complications.  Overall progress is considered to be: Slow   Patient will not continue to benefit from skilled PT services. Patient needs more medical intervention for pain management followed by return to PT if able.     Conditions requiring continued skilled PT services: Body Structures, Functions, Activity Limitations Requiring Skilled Therapeutic Intervention: Decreased functional mobility ; Decreased ROM; Decreased body mechanics; Decreased strength; Decreased endurance      Factors which may impact rehabilitation potential include:  pain, lumbar compromise     Plan:   Patient placed on PT hold at this time until follow up with spinal orthopedics and plan of care determined to assist with pain management. Unable to tolerate progression of PT at this time.   Therapist Electronic Signature:    Jodelle Red Goodwin [place], PT, 11/06/2021 Start Time: 12:00 PM EST    End Time: 1230   Timed Minutes:  30  Untimed Minutes:   Total Treatment Time: 30   Time Breakdown:  Modalities:   MT:     TE: 30    TA:    GT:    NMRE

## 2021-11-06 NOTE — Telephone Encounter (Signed)
Patient stopped by the front desk after an appt upstairs asking to schedule a follow up appt with Dr Rose Phi for back pain.  Scheduled pt for 11/11/21 @ 930a.      Barbara Elliott

## 2021-11-06 NOTE — Telephone Encounter (Signed)
Noted.  Rochell Puett L Maize Brittingham, MA

## 2021-11-11 ENCOUNTER — Ambulatory Visit: Admit: 2021-11-11 | Discharge: 2021-11-11 | Payer: MEDICARE | Attending: Orthopaedic Surgery | Primary: Family

## 2021-11-11 ENCOUNTER — Encounter: Payer: MEDICARE | Primary: Family

## 2021-11-11 DIAGNOSIS — M5416 Radiculopathy, lumbar region: Secondary | ICD-10-CM

## 2021-11-11 NOTE — Progress Notes (Signed)
CC:   Chief Complaint   Patient presents with    Back Pain     Lumbar radiculopathy        HPI: The patient is a 70 y.o.-year-old female who presents today for follow-up after physical therapy for leg pain.  She was doing better last time, but now pain has returned.  She think some of that may have been because she was finish she has steroids.    She now describes predictable radiating pain in her right leg, starting in the buttock going down to the calf.  She is using Tylenol, and some rob.  Does help, but she still very limited in standing in particular, but also walking activities.  After short distances, she has to sit down because of the pain.  Does not involve the left leg.  Back itself is not limiting.     Currently the back pain is not limiting.  It is all related to her right leg.  She has not noted progressive weakness but rather the pain that occurs is limiting.    For pain, they are Tylenol and ibuprofen.  She still doing the stretches for physical therapy, but she is not gaining.  She is looking for intervention.    She denies any fevers, chills, or specific night pain.  No bowel or bladder changes.    Past Medical History:   Diagnosis Date    Headache     High blood pressure     High cholesterol     Wears glasses      Past Surgical History:   Procedure Laterality Date    HYSTERECTOMY (CERVIX STATUS UNKNOWN)  2012    IR CHOLECYSTOSTOMY PERCUTANEOUS COMPLETE  1980    LUMBAR DISCECTOMY  1998    OTHER SURGICAL HISTORY      pelvic floor, mesh and raise bladder     Allergies   Allergen Reactions    Codeine Shortness Of Breath    Lisinopril Cough         Current Outpatient Medications:     metoprolol succinate (TOPROL XL) 25 MG extended release tablet, TAKE 1 TABLET BY MOUTH EVERY DAY FOR BLOOD PRESSURE, Disp: 90 tablet, Rfl: 1    atorvastatin (LIPITOR) 40 MG tablet, Take 1 tablet by mouth daily, Disp: 90 tablet, Rfl: 3    acetaminophen (TYLENOL) 325 MG tablet, Take by mouth every 4 hours as needed, Disp: ,  Rfl:     amLODIPine (NORVASC) 10 MG tablet, Take 10 mg by mouth daily, Disp: , Rfl:     aspirin 81 MG chewable tablet, Take 81 mg by mouth daily, Disp: , Rfl:     calcium carbonate 1500 (600 Ca) MG TABS tablet, Take 600 mg by mouth 2 times daily, Disp: , Rfl:     Cholecalciferol 50 MCG (2000 UT) TABS, Take 1 tablet by mouth daily, Disp: , Rfl:     glucosamine-chondroitin 500-400 MG CAPS, Take 1 capsule by mouth daily, Disp: , Rfl:     ibuprofen (ADVIL;MOTRIN) 600 MG tablet, Take by mouth every 6 hours as needed, Disp: , Rfl:       Examination:   BP 138/80 (Site: Right Upper Arm, Position: Sitting)    Pulse 84    Resp 14    Ht 5\' 1"  (1.549 m)    Wt 151 lb (68.5 kg)    BMI 28.53 kg/m??     Wt Readings from Last 3 Encounters:   11/11/21 151 lb (68.5 kg)  10/21/21 150 lb (68 kg)   10/14/21 150 lb (68 kg)        The patient is awake, alert and oriented times 3 and answering questions appropriately. Affect is friendly and breathing is unlabored.   The patient is no acute distress sitting in the office.  She tolerates examination well.  She is mildly deconditioned and overweight.    Chest: Clear to auscultation bilaterally.    Cardiac:  Regular rate and rhythm without murmur appreciated.      Abdomen:  Positive bowel sounds.     Examination of the spine she stands well balance.  Gentle range of motion is well tolerated.    Neurologic examination show mild straight leg raise on the right negative on the left.  She is able tone heel walk.  Reflexes absent at the Achilles.  No pathologic findings.    Extremity evaluation show good gentle range of motion with mild discomfort towards her trochanters.  No specific weight-bearing pain reproduction.    Vascular evaluation show good distal pulses with minimal pretibial edema.    Radiographic evaluation:    MRI of the lumbar spine [facility] dated 10/16/2021 was reviewed.  This shows some global degenerative changes in the thoracolumbar junction down to L4,  there is sparing of the L4-5 disc.  At L5-S1, there are postsurgical changes on the right side consistent with a diskectomy.  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  There is significant L5-S1 disc space narrowing, but no severe foraminal stenosis.  No other nerve compromise is noted.  No signs of instability.    IMPRESSION:      ICD-10-CM    1. Lumbar radiculopathy  M54.16       2. 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  She is presenting like lateral recess stenosis, which would be consistent with the findings on the MRI.  Her pain is very consistent, and is quite limiting for her.  She has failed physical therapy, as well as home exercise program.  This been going on for over five months.  We discussed other options including injections, which she has had in the past but did not really want to proceed at this point.  Given the duration of symptoms and the lack of improvement, I think surgical intervention would beneficial.      We discussed some of the risks and benefits including reoperate in which she has had surgery before.  Explained to her that the goal would be to take some of the fragments out, to minimize the narrowing, and therefore lower nerve root a to function better when she is standing and walking.  She understands this is a degenerative process and she may have recurrence.  She would like to proceed.  We therefore plan to schedule her for a re-exploration on the right-sided L5-S1 with diskectomy.  Info consent was obtained.    Plan:     Schedule re-exploration L5-S1 on the right with diskectomy.    I reviewed my evaluation at length with the patient.  Their questions were answered to their satisfaction.  They do know to contact us regarding any questions, problems, or significant  change in their symptoms as discussed.      I personally spent a total of 36 minutes on today's visit doing chart preparation, review of previous records and labs, performing a medically appropriate evaluation, counseling and educating the patient and documenting clinical information in the electronic health record.     Thank you for allowing me to participate in the care your patient.  We will keep you informed regarding their progress.  Please do not hesitate to contact my office if we can be of further assistance.    Sincerely,    Aastha Dayley MD    No orders of the defined types were placed in this encounter.

## 2021-11-11 NOTE — H&P (Signed)
CC:   Chief Complaint   Patient presents with    Back Pain     Lumbar radiculopathy        HPI: The patient is a 70 y.o.-year-old female who presents today for follow-up after physical therapy for leg pain.  She was doing better last time, but now pain has returned.  She think some of that may have been because she was finish she has steroids.    She now describes predictable radiating pain in her right leg, starting in the buttock going down to the calf.  She is using Tylenol, and some rob.  Does help, but she still very limited in standing in particular, but also walking activities.  After short distances, she has to sit down because of the pain.  Does not involve the left leg.  Back itself is not limiting.     Currently the back pain is not limiting.  It is all related to her right leg.  She has not noted progressive weakness but rather the pain that occurs is limiting.    For pain, they are Tylenol and ibuprofen.  She still doing the stretches for physical therapy, but she is not gaining.  She is looking for intervention.    She denies any fevers, chills, or specific night pain.  No bowel or bladder changes.    Past Medical History:   Diagnosis Date    Headache     High blood pressure     High cholesterol     Wears glasses      Past Surgical History:   Procedure Laterality Date    HYSTERECTOMY (CERVIX STATUS UNKNOWN)  2012    IR CHOLECYSTOSTOMY PERCUTANEOUS COMPLETE  1980    LUMBAR DISCECTOMY  1998    OTHER SURGICAL HISTORY      pelvic floor, mesh and raise bladder     Allergies   Allergen Reactions    Codeine Shortness Of Breath    Lisinopril Cough         Current Outpatient Medications:     metoprolol succinate (TOPROL XL) 25 MG extended release tablet, TAKE 1 TABLET BY MOUTH EVERY DAY FOR BLOOD PRESSURE, Disp: 90 tablet, Rfl: 1    atorvastatin (LIPITOR) 40 MG tablet, Take 1 tablet by mouth daily, Disp: 90 tablet, Rfl: 3    acetaminophen (TYLENOL) 325 MG tablet, Take by mouth every 4 hours as needed, Disp: ,  Rfl:     amLODIPine (NORVASC) 10 MG tablet, Take 10 mg by mouth daily, Disp: , Rfl:     aspirin 81 MG chewable tablet, Take 81 mg by mouth daily, Disp: , Rfl:     calcium carbonate 1500 (600 Ca) MG TABS tablet, Take 600 mg by mouth 2 times daily, Disp: , Rfl:     Cholecalciferol 50 MCG (2000 UT) TABS, Take 1 tablet by mouth daily, Disp: , Rfl:     glucosamine-chondroitin 500-400 MG CAPS, Take 1 capsule by mouth daily, Disp: , Rfl:     ibuprofen (ADVIL;MOTRIN) 600 MG tablet, Take by mouth every 6 hours as needed, Disp: , Rfl:       Examination:   BP 138/80 (Site: Right Upper Arm, Position: Sitting)    Pulse 84    Resp 14    Ht 5\' 1"  (1.549 m)    Wt 151 lb (68.5 kg)    BMI 28.53 kg/m??     Wt Readings from Last 3 Encounters:   11/11/21 151 lb (68.5 kg)  10/21/21 150 lb (68 kg)   10/14/21 150 lb (68 kg)        The patient is awake, alert and oriented times 3 and answering questions appropriately. Affect is friendly and breathing is unlabored.   The patient is no acute distress sitting in the office.  She tolerates examination well.  She is mildly deconditioned and overweight.    Chest: Clear to auscultation bilaterally.    Cardiac:  Regular rate and rhythm without murmur appreciated.      Abdomen:  Positive bowel sounds.     Examination of the spine she stands well balance.  Gentle range of motion is well tolerated.    Neurologic examination show mild straight leg raise on the right negative on the left.  She is able tone heel walk.  Reflexes absent at the Achilles.  No pathologic findings.    Extremity evaluation show good gentle range of motion with mild discomfort towards her trochanters.  No specific weight-bearing pain reproduction.    Vascular evaluation show good distal pulses with minimal pretibial edema.    Radiographic evaluation:    MRI of the lumbar spine from [facility] dated 10/16/2021 was reviewed.  This shows some global degenerative changes in the thoracolumbar junction down to L4,  there is sparing of the L4-5 disc.  At L5-S1, there are postsurgical changes on the right side consistent with a diskectomy.  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  There is significant L5-S1 disc space narrowing, but no severe foraminal stenosis.  No other nerve compromise is noted.  No signs of instability.    IMPRESSION:      ICD-10-CM    1. Lumbar radiculopathy  M54.16       2. 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  She is presenting like lateral recess stenosis, which would be consistent with the findings on the MRI.  Her pain is very consistent, and is quite limiting for her.  She has failed physical therapy, as well as home exercise program.  This been going on for over five months.  We discussed other options including injections, which she has had in the past but did not really want to proceed at this point.  Given the duration of symptoms and the lack of improvement, I think surgical intervention would beneficial.      We discussed some of the risks and benefits including reoperate in which she has had surgery before.  Explained to her that the goal would be to take some of the fragments out, to minimize the narrowing, and therefore lower nerve root a to function better when she is standing and walking.  She understands this is a degenerative process and she may have recurrence.  She would like to proceed.  We therefore plan to schedule her for a re-exploration on the right-sided L5-S1 with diskectomy.  Info consent was obtained.    Plan:     Schedule re-exploration L5-S1 on the right with diskectomy.    I reviewed my evaluation at length with the patient.  Their questions were answered to their satisfaction.  They do know to contact us regarding any questions, problems, or significant  change in their symptoms as discussed.  I personally spent a total of 36 minutes on today's visit doing chart preparation, review of previous records and labs, performing a medically appropriate evaluation, counseling and educating the patient and documenting clinical information in the electronic health record.     Thank you for allowing me to participate in the care your patient.  We will keep you informed regarding their progress.  Please do not hesitate to contact my office if we can be of further assistance.    Sincerely,    Rachael Zapanta MD    No orders of the defined types were placed in this encounter.

## 2021-11-14 ENCOUNTER — Encounter: Payer: MEDICARE | Primary: Family

## 2021-11-14 NOTE — Telephone Encounter (Signed)
First availability with pcp is after scheduled surgery.    Called pt and scheduled OV for pre-op:  Future Appointments   Date Time Provider Department Center   11/19/2021  9:00 AM [address] [place], [place] AM[facility]ML [facility] AMB   11/26/2021 10:45 AM [facility] PAT 01 [facility]   01/01/2022  9:30 AM Mats Rose Phi, MD ORTHO [facility]ML AMB   01/07/2022 11:30 AM Grant Ruts, DO RHEUM [facility] AMB   04/02/2022  9:00 AM Amy Baldwin Crown, APRN - NP [facility]ML AMB

## 2021-11-14 NOTE — Telephone Encounter (Signed)
Pt seen by Dr Rose Phi and is scheduled for L5-S1 decompression on 12/10/21. Dr Rose Phi is requesting pre-op clearance/risk assessment.  I though I sent this already but could not find it to follow up.  Thanks  Konrad Felix, RN  11/14/2021  9:34 AM

## 2021-11-18 NOTE — Telephone Encounter (Signed)
Scheduled with pcp   Future Appointments   Date Time Provider Department Center   11/26/2021 10:45 AM [facility] PAT 01 [facility]   11/27/2021  2:30 PM Amy Richardean Sale, APRN - NP AMASML [facility]ML AMB   01/01/2022  9:30 AM Mats Rose Phi, MD ORTHO [facility]ML AMB   01/07/2022 11:30 AM Grant Ruts, DO RHEUM SML AMB   04/02/2022  9:00 AM Amy Richardean Sale, APRN - NP [facility]AS[facility]ML AMB

## 2021-11-19 ENCOUNTER — Ambulatory Visit: Payer: MEDICARE | Attending: Physician Assistant | Primary: Family

## 2021-11-26 ENCOUNTER — Inpatient Hospital Stay: Admit: 2021-11-26 | Payer: MEDICARE | Primary: Family

## 2021-11-27 ENCOUNTER — Ambulatory Visit: Admit: 2021-11-27 | Discharge: 2021-11-27 | Payer: MEDICARE | Attending: Family | Primary: Family

## 2021-11-27 DIAGNOSIS — I10 Essential (primary) hypertension: Secondary | ICD-10-CM

## 2021-11-27 DIAGNOSIS — Z01818 Encounter for other preprocedural examination: Secondary | ICD-10-CM

## 2021-11-27 NOTE — Assessment & Plan Note (Signed)
Previous falls at home. Learning how to use a cane for support. Discussed safety in the home. Assistance as needed and muscle strengthening. Monitor for improvement.

## 2021-11-27 NOTE — Assessment & Plan Note (Signed)
??   I recommended eating a healthy diet, low-sodium intake (<1.5gm/day), and regular aerobic exercise (150 minutes or more per week).  ?? Encouraged home blood pressure monitoring, and follow up if systolic blood pressure is over 140 or diastolic pressure is over 90 consistently.  ?? Plan and medication management: Blood pressure is normotensive - continue current treatment and no change needed.

## 2021-11-27 NOTE — Assessment & Plan Note (Signed)
Completed ACS Risk Calculator. Patient is below risk. Patient is optimized for surgery with Dr. Rose Phi on 12/10/21. Reviewed Code status with patient and advanced directive with patient. Patient will bring home form and discuss with family.

## 2021-11-27 NOTE — Progress Notes (Signed)
[facility]   [address]  [place] [address]    CHIEF COMPLAINT   Barbara Elliott is a 70 y.o. female who presents to to the office for pre-operative evaluation.   HISTORY OF PRESENT ILLNESS   She has met with the surgeon- she completed PT and it aggravated the pain more   Patient presents in consultation, as requested by her surgeon, for pre-operative risk assessment for upcoming surgery.    Surgeon:  Dr. Terrial Elliott  PCP:  Barbara Ard, APRN - NP  Date of Surgery:  12/10/21  Pre-operative Diagnosis:  L5-S1 nerve root compression  Procedure planned:  Laminectomy  Anesthesia:  To be determined    History of Anesthesia Complications: no  History of Abnormal Bleeding: no  History of Blood Transfusions: no  Recent use of Anticoagulant: No recent use of aspirin, NSAIDs, steroids or any anticoagulants       HYPERTENSION:    She reports good medication compliance and no side effects.   Home blood pressure monitoring is  monitoring at home with in range  Chest pain:  no  Dizziness:  no  New or worsening leg edema:  no    BP Readings from Last 3 Encounters:   11/27/21 124/76   11/11/21 138/80   10/14/21 (!) 152/80     Lab Results   Component Value Date    K 3.7 03/25/2021    CREATININE 0.78 03/25/2021    NA 136 03/25/2021      Key Anti-Hypertensive Meds            metoprolol succinate (TOPROL XL) 25 MG extended release tablet (Taking)    Sig: TAKE 1 TABLET BY MOUTH EVERY DAY FOR BLOOD PRESSURE    Patient taking differently: Take 25 mg by mouth daily    Cosign for Ordering: Accepted by Edmonia Lynch, APRN - NP on 10/06/2021  8:44 AM    amLODIPine (NORVASC) 10 MG tablet (Taking)    Class: Historical Med              Tobacco use:  She is currently smoking  less than a half a pack a day  and is interested in trying to quit. Treatment tried: reducing and at times not smoking anything.   Tobacco Hx:  She  reports that she has been smoking cigarettes. She has been smoking an average of .25 packs  per day. She has never used smokeless tobacco.     Reviewed on today's visit patient's past medical/surgical history, family/social history, medications allergies, previous lab work and imaging studies immunizations and preventive care    MEDICATIONS     Current Outpatient Medications   Medication Sig    Multiple Vitamin (MULTIVITAMIN ADULT PO) Take 1 tablet by mouth daily    metoprolol succinate (TOPROL XL) 25 MG extended release tablet TAKE 1 TABLET BY MOUTH EVERY DAY FOR BLOOD PRESSURE (Patient taking differently: Take 25 mg by mouth daily)    atorvastatin (LIPITOR) 40 MG tablet Take 1 tablet by mouth daily (Patient taking differently: Take 40 mg by mouth every evening)    acetaminophen (TYLENOL) 325 MG tablet Take 650 mg by mouth every 4 hours as needed    amLODIPine (NORVASC) 10 MG tablet Take 10 mg by mouth every evening    calcium carbonate 1500 (600 Ca) MG TABS tablet Take 600 mg by mouth 2 times daily    Cholecalciferol 50 MCG (2000 UT) TABS Take 1 tablet by mouth daily  glucosamine-chondroitin 500-400 MG CAPS Take 1 capsule by mouth daily    ibuprofen (ADVIL;MOTRIN) 600 MG tablet Take 600 mg by mouth every 6 hours as needed     No current facility-administered medications for this visit.     Medications Discontinued During This Encounter   Medication Reason    aspirin 81 MG chewable tablet Therapy completed       ALLERGIES     Allergies   Allergen Reactions    Codeine Shortness Of Breath    Lisinopril Cough       REVIEW OF SYSTEMS     Neurologic: Denies headache, dizziness, syncope or tremors.     Eyes:  No change in vision.   Ears:  No decreased hearing.  No tinnitus.   Nose:  No rhinorrhea or congestion   Oropharynx:  No sores, dysphagia, dental pain, or sore throat   Pulmonary:  Denies cough or shortness of breath.   Cardiac:  Denies chest pain, palpitations, or peripheral edema.   GI:  Denies abdominal pain, nausea, emesis, constipation, diarrhea   GU:  Denies dysuria, hematuria, weak stream, or  nocturia.    Endocrine:  Denies polyuria, polydipsia, or weight change  Musculoskeletal:  Denies any new myalgias, joint pain, or joint swelling  Psychiatric:  Denies depression, anxiety, or excessive irritability    ACTIVE MEDICAL PROBLEMS     Patient Active Problem List   Diagnosis    Encounter for hepatitis C screening test for low risk patient    Hypertension    Acquired hypothyroidism    Preventative health care    Mixed hyperlipidemia    Current every day smoker    Prediabetes    Left hip pain    Encounter for immunization    Encounter for subsequent annual wellness visit (AWV) in Medicare patient    Vitamin D deficiency    Right hip pain    Primary osteoarthritis of right hip    Primary localized osteoarthrosis    Low back pain radiating to right leg    Lumbar radiculopathy    Preoperative clearance    At high risk for falls     PAST SURGICAL HISTORY     Past Surgical History:   Procedure Laterality Date    HYSTERECTOMY (CERVIX STATUS UNKNOWN)  2012    IR CHOLECYSTOSTOMY PERCUTANEOUS COMPLETE  1980    LUMBAR DISCECTOMY  1998    OTHER SURGICAL HISTORY      pelvic floor, mesh and raise bladder     SOCIAL HISTORY     Social History     Social History Narrative    y day.  Her husband does smoke cigars.  She does not use illicit drugs.  She does drink wine 1-2 glasses at night.    She enjoys her time with grand children, reading.    Wears a seat belt in a car.          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 Married to [employer].  4 children together:  - [place] child- 1 boy  -[place] child -1 girl  -Benjamine Mola ([place]) [phone] - 3 girls    She does currently smoke cigarettes - 1/2 a pack a week, not ever     FAMILY HISTORY     Family History   Problem Relation Age of Onset    Other Sister         prediabetic    Obesity Sister  No Known Problems Brother     Mastectomy Maternal Grandmother 80        breast cancer    No Known Problems Maternal Grandfather     No Known Problems Paternal Grandmother      Diabetes Paternal Grandfather     Osteoarthritis Brother 39        2 knee replacement    No Known Problems Brother     Uterine Cancer Neg Hx     Ovarian Cancer Neg Hx     Hypertension Mother     Diabetes Father     Colon Cancer Sister 9        colostomy     VITALS     Vitals:    11/27/21 1429   BP: 124/76   Site: Left Upper Arm   Position: Sitting   Cuff Size: Medium Adult   Pulse: 84   SpO2: 98%   Weight: 146 lb (66.2 kg)   Height: 5' 1"  (1.549 m)    - Body mass index is 27.59 kg/m??.    PHYSICAL EXAM     Eyes: PERRLA, non-injected  Ears: Tympanic membranes non-erythematous and in the neutral position, EAM without erythema or discharge  Nose: Patent  Oropharynx: Oral mucosa moist without lesions, no pharyngeal injection, tonsillar hypertrophy or exudate  Neck: No adenopathy, thyromegaly, masses, or bruits  Heart: regular rate and rhythm without murmur  Lungs: clear to auscultation  Abdomen: soft, nontender, no masses or organomegaly  Extremities: no edema, pulses are normal  Skin:  No rash or suspicious skin lesions  Musculoskeletal:  No joint swelling or tenderness  Neurologic:  CNII-XII intact. Normal strength, sensation and reflexes throughout.    LABS & IMAGING     No visits with results within 1 Month(s) from this visit.   Latest known visit with results is:   Orders Only on 03/25/2021   Component Date Value Ref Range Status    Vitamin D, 25-Hydroxy, Total 03/25/2021 36.9  ng/mL Final    Comment: Adults  Deficient:               <20 ng/mL  Insufficient:          20-29 ng/mL  Sufficient:           30-100 ng/mL  Potential intoxication: >100 ng/mL       Less than 61 years of age  Deficient:               <15 ng/mL  Insufficient         15 - 20 ng/mL  Sufficient:           20-100 ng/mL      Sodium 03/25/2021 136  136 - 145 mmol/L Final    Potassium 03/25/2021 3.7  3.4 - 5.1 mmol/L Final    Chloride 03/25/2021 104  98 - 108 mmol/L Final    CO2 03/25/2021 28  21 - 32 mmol/L Final    Glucose 03/25/2021 98  74 -  106 mg/dL Final    BUN 03/25/2021 14  7 - 22 MG/DL Final    Creatinine 03/25/2021 0.78  0.55 - 1.10 MG/DL Final    GFR African American 03/25/2021 >60  >60 ml/min/1.63m Final    EGFR IF NonAfrican American 03/25/2021 >60  >60 ml/min/1.73mFinal    Calcium 03/25/2021 9.6  8.5 - 10.1 MG/DL Final    Total Bilirubin 03/25/2021 1.10 (A)  0.00 - 1.00 mg/dL Final    ALT 03/25/2021 29  12 - 78 U/L Final    AST 03/25/2021 22  10 - 37 U/L Final    Alkaline Phosphatase 03/25/2021 85  43 - 117 U/L Final    Total Protein 03/25/2021 8.1  6.4 - 8.2 g/dL Final    Albumin 03/25/2021 4.0  3.4 - 5.0 g/dL Final    T4, Free 03/25/2021 0.8  0.76 - 1.46 ng/dL Final    Hemoglobin A1C 03/25/2021 5.5  <5.7 % Final    Comment: Reference Range  Diabetic >=6.5%  Prediabetes  5.7-6.4%  Normal       <5.7%  The American Diabetes Association recommends an A1C goal of <7% for most adults with diabetes.      eAG 03/25/2021 111  mg/dL Final    WBC 03/25/2021 8.0  4.5 - 10.9 K/uL Final    RBC 03/25/2021 5.06  4.20 - 5.40 M/uL Final    Hemoglobin 03/25/2021 15.4  12.0 - 16.0 g/dL Final    Hematocrit 03/25/2021 46.8  37.0 - 47.0 % Final    MCV 03/25/2021 92.5  80.0 - 94.0 FL Final    MCH 03/25/2021 30.4  27.0 - 31.0 PG Final    MCHC 03/25/2021 32.9 (A)  33.0 - 37.0 g/dL Final    RDW 03/25/2021 13.4  11.5 - 14.5 % Final    Platelets 03/25/2021 330  130 - 400 K/uL Final    MPV 03/25/2021 10.7 (A)  7.4 - 10.4 FL Final    Nucleated RBCs 03/25/2021 0.0  PER 100 WBC Final    NRBC Absolute 03/25/2021 0.00  K/uL Final    Neutrophils % 03/25/2021 60  47 - 80 % Final    Lymphocytes % 03/25/2021 25  14 - 46 % Final    Monocytes % 03/25/2021 11  5 - 12 % Final    Eosinophils % 03/25/2021 3  0 - 5 % Final    Basophils % 03/25/2021 1  0 - 2 % Final    Immature Granulocytes 03/25/2021 0  0.0 - 0.6 % Final    Neutrophils Absolute 03/25/2021 4.8  1.6 - 6.1 K/UL Final    Lymphocytes Absolute 03/25/2021 2.0  1.2 - 3.7 K/UL Final    Monocytes Absolute 03/25/2021 0.9  0.2  - 1.0 K/UL Final    Eosinophils Absolute 03/25/2021 0.2  0.0 - 0.4 K/UL Final    Basophils Absolute 03/25/2021 0.0  0.0 - 0.1 K/UL Final    Granulocyte Absolute Count 03/25/2021 0.0  0.00 - 0.04 K/UL Final    Differential Type 03/25/2021 AUTOMATED   Final    Cholesterol, Total 03/25/2021 187  0 - 199 MG/DL Final    Comment: Cholesterol Interpretation:  Age 79 years or greater:      Desirable:    <200     mg/dL      Borderline:    200-239 mg/dL      High risk:   >=240     mg/dL       Age 50-17 years:      Acceptable:   <170     mg/dL      Borderline:    170-199 mg/dL      High:        >=200     mg/dL      Triglycerides 03/25/2021 173 (A)  <150 MG/DL Final    Comment: Triglyceride Interpretation  Age 50-9 years      Acceptable:       <75    mg/dL  Borderline high:   75-99 mg/dL      High:            >=100   mg/dL       Age 34-17 years      Acceptable:       < 90   mg/dL      Borderline high:  90-129 mg/dL      High:             >= 130 mg/dL       Adult (Age 34 or over)      Normal:           < 150   mg/dL      Borderline high:  150-199 mg/dL      High:             200-499 mg/dL      Very high:        >= 500  mg/dL      HDL 03/25/2021 74  >50 MG/DL Final    Comment: HDL interpretation  Age 10-17 years:      Low HDL:           <40 mg/dL      Borderline low:  40-45 mg/dL      Acceptable:        >45 mg/dl       Age >18 years:      > 40 mg/dL (males)      > 50 mg/dL (females)      LDL Calculated 03/25/2021 78.4  MG/DL Final    Comment: Calculated LDL Interpretation  Age greater than 17 years:      Optimal:           <100     mg/dL      Near optimal:       100-129 mg/dL      High Borderline:    130-159 mg/dL      High:               160-189 mg/dL      Very high:          >=190   mg/dL       Ages 2 - 17 years      Acceptable:        <110     mg/dL      Borderline high:    110-129 mg/dL      High:               >130    mg/dL      Chol/HDL Ratio 03/25/2021 2.5  NA Final    Comment: Coronary Risk Factor      Risk                Men        Women    1/2 Average         3.43        3.27    Average             4.97        4.44    2x Average          9.55        7.08    3x Average         23.99       11.04      Non HDL Chol. (LDL+VLDL) 03/25/2021 113  mg/dL Final    Comment: Non-HDL interpretation  Adult        Desirable:       <130    mg/dL        Borderline high: 139-159 mg/dL        High:            160-189 mg/dL        Very high:      >= 190   mg/dL       Age 25 - 17        Acceptable:      <120    mg/dL        Borderline high: 120-144 mg/dL        High             >= 145  mg/dL      TSH, 3RD GENERATION 03/25/2021 3.970 (A)  0.358 - 3.740 uIU/mL Final    HCV Ab 03/25/2021 NONREACTIVE  NONREACTIVE Final    A non-reactive result does not exclude the possibility of exposure to or early acute infection with HCV. The performance of the assay has not been established for patients 18 months or younger.       IMMUNIZATIONS     Immunization History   Administered Date(s) Administered    COVID-19, MODERNA BLUE border, Primary or Immunocompromised, (age 202y+), IM, 100 mcg/0.53m 05/13/2021    COVID-19, MODERNA Bivalent BOOSTER, (age 202y+), IM, 560mcg/0.5 mL 11/27/2021    COVID-19, PFIZER PURPLE top, DILUTE for use, (age 2031y+), 380m/0.3mL 02/10/2020, 03/04/2020, 11/04/2020    Influenza, FLUZONE (age 72+), High Dose, 0.78m42m2/12/2020    Influenza, High Dose (Fluzone 65 yrs and older) 11/04/2020    Pneumococcal Conjugate Vaccine 06/10/2015    Pneumococcal Polysaccharide (Pneumovax23) 07/03/2016    Tdap (Boostrix, Adacel) 12/01/2019    Zoster Recombinant (Shingrix) 10/22/2015       ASSESSMENT AND PLAN     1. Preoperative clearance  Assessment & Plan:  Completed ACS Risk Calculator. Patient is below risk. Patient is optimized for surgery with Dr. AgrTerrial Elliott 12/10/21. Reviewed Code status with patient and advanced directive with patient. Patient will bring home form and discuss with family.    2. Primary hypertension  Assessment & Plan:  I recommended  eating a healthy diet, low-sodium intake (<1.5gm/day), and regular aerobic exercise (150 minutes or more per week).  Encouraged home blood pressure monitoring, and follow up if systolic blood pressure is over 140161 diastolic pressure is over 90 consistently.  Plan and medication management: Blood pressure is normotensive - continue current treatment and no change needed.     3. At high risk for falls  Assessment & Plan:  Previous falls at home. Learning how to use a cane for support. Discussed safety in the home. Assistance as needed and muscle strengthening. Monitor for improvement.    4. Low back pain radiating to right leg  Assessment & Plan:  Plan surgical operation. On 12/10/21 with Dr. AgrTerrial Elliott 5. Encounter for immunization  -     Influenza, FLUManchesterage 73 47), IM, Preservative Free, 0.7 mL  -     COVID-19, MODERNA Bivalent BOOSTER, (age 202y+), IM, 50 mcg/0.5 mL  6. Current every day smoker  Assessment & Plan:  Discussed and encouraged quitting smoking to promote proper healing post operatively.   Discussed smoking cessation with her and the potential risks of continued smoking including: increased risk of cancer, heart disease, stroke, and poor healing.   There are several methods to  help her quit including:   She is interested in attempting to quit at this time.    She was counseled on the dangers of tobacco use, and was advised to quit.  Reviewed strategies to maximize success, including removing cigarettes and smoking materials from environment.       Pre-Operative Evaluation:  ACS RISK CALCULATOR  Cardiopulmonary risk assessment was performed today and patient is at [place] risk for upcoming surgery.  Patient is medically optimized and cleared for surgery.  Anticoagulation considerations:  none  Nutritional Status is normal (albumin level > 3.4 mg per dL).  Albumin   Date Value Ref Range Status   03/25/2021 4.0 3.4 - 5.0 g/dL Final     There are no contraindications to anesthesia.  Copy  of this report to be sent to the requesting surgeon.    Follow up:  No follow-ups on file.     Future Appointments   Date Time Provider [place]   01/01/2022  9:30 AM Dorice Lamas, MD [facility] AMB   01/07/2022 11:30 AM Elaina Pattee, DO [facility] AMB   04/02/2022  9:00 AM Rodolfo Gaster Meliton Rattan, APRN - NP [facility] AM[facility]       Barbara Ard, APRN - NP  11/27/2021

## 2021-11-27 NOTE — Progress Notes (Signed)
FLU VACCINE PATIENT QUESTIONNAIRE  Are you feeling well today?  (Individuals who are ill or have a fever should delay flu vaccination until the fever and other temporary symptoms are gone.)   Yes   Are you pregnant? No   Do you have an allergy to eggs, chicken products, gelatin, or gentamicin?  No   Have you ever had Guillain- Barr?? Syndrome? No   Have you ever had a severe reaction to a previous dose of flu vaccine? No   Do you have a sensitivity to thimerosal or formaldehyde? No   Are you undergoing treatment for cancer, or do you have an immune disorder?  No   I have read or have had explained to me information about influenza (the flu) and the influenza vaccine.  I have had a chance to ask questions which were answered to my satisfaction.  I believe I understand the benefits and risks of the influenza vaccine and request that the vaccine be given to me.     Yes

## 2021-11-27 NOTE — Assessment & Plan Note (Signed)
Plan surgical operation. On 12/10/21 with Dr. Rose Phi.

## 2021-11-27 NOTE — Assessment & Plan Note (Signed)
Discussed and encouraged quitting smoking to promote proper healing post operatively.   ?? Discussed smoking cessation with her and the potential risks of continued smoking including: increased risk of cancer, heart disease, stroke, and poor healing.   ?? There are several methods to help her quit including:   ?? She is interested in attempting to quit at this time.    She was counseled on the dangers of tobacco use, and was advised to quit.  Reviewed strategies to maximize success, including removing cigarettes and smoking materials from environment.

## 2021-12-08 NOTE — Telephone Encounter (Signed)
Barbara Elliott is a 70 y.o. female  Ren calling from [facility]   Wanting to know if we have sent the authorization for surgery   She does not see any request for authorization on her end     L5-S1 RT lumbar decompression [id number] ([place])    Cb  # [phone]  [name]

## 2021-12-08 NOTE — Telephone Encounter (Signed)
Patient name and DOB verified          Patient is calling regarding prior Auth for her surgery, she contacted her insurance and they have not heard anything about surgery.  Patient is asking for a return call regarding this.    Please call to discuss  [phone] (home)           Kerby Nora

## 2021-12-08 NOTE — Telephone Encounter (Signed)
Sent outgoing staff message to Surgery Auth Dept, included Ortho Spine Team to notify them of patients call.  Asked that return call be given to patient.

## 2021-12-10 ENCOUNTER — Encounter

## 2021-12-10 MED ORDER — PREDNISONE 20 MG PO TABS
20 MG | ORAL_TABLET | ORAL | 0 refills | Status: DC
Start: 2021-12-10 — End: 2022-01-22

## 2021-12-10 NOTE — Telephone Encounter (Signed)
Called and spoke with pt. Surgery R/s'd to 12/31/21. Offered 12/21 but pt was unavailable.   Konrad Felix, RN  12/10/2021  10:12 AM

## 2021-12-10 NOTE — Telephone Encounter (Signed)
Patient's sx resch to 12/31/21 due to no auth.  Faxed to OR and updated [facility].  Called out to patient with new followup appt 01/22/22 @9 :30.

## 2021-12-10 NOTE — Telephone Encounter (Signed)
Patient name and DOB verified        Patient is asking to speak with Randa Evens, she has  some questions regarding surgery scheduling.    Please call to discuss  [phone] (home)           Kerby Nora

## 2021-12-24 NOTE — Telephone Encounter (Signed)
Barbara Elliott is a 70 y.o. female  Patient is having surgery with Dr Rose Phi :   L5-S1 RT lumbar decompression [phone] ([place])    She  has an appt with rheumatology on 01/07/22 for injection in bilateral hips     She is asking , if she needs to move this appt   Is it to soon between sx and injection ?    Please call to discuss    [phone] (home)   [name]

## 2021-12-24 NOTE — Telephone Encounter (Signed)
Pt is having surgery 12/31/21 of the lumbar. Pt is scheduled 01/07/22 for left hip joint injection. Pt is wondering how far out to have the injections after the lumbar surgery. Pt was advised to discuss with the surgeon.    Pt #  [phone]

## 2021-12-30 ENCOUNTER — Ambulatory Visit: Payer: MEDICARE | Primary: Family

## 2021-12-30 NOTE — Telephone Encounter (Signed)
December 30, 2021  Grant Ruts, DO  to Ila Mcgill, CNA        8:19 AM  We typically want to avoid immunosuppressive medications (like an injection of high dose steroid) that could have the potential to risk quality of wound healing or increase the risk of a surgical wound infection, that interval is the 14 days immediately after surgery.

## 2021-12-30 NOTE — Telephone Encounter (Signed)
Per staff message from [place], patient called stating she still wanted to have this.    I did try to call patient to discuss Dr.O'Connells message.    I did leave her a detailed VM with his information but also let her know that we could do the injection if she wanted to.

## 2021-12-31 ENCOUNTER — Encounter

## 2021-12-31 ENCOUNTER — Ambulatory Visit: Admit: 2021-12-31 | Payer: MEDICARE | Primary: Family

## 2021-12-31 ENCOUNTER — Inpatient Hospital Stay: Payer: MEDICARE

## 2021-12-31 MED ORDER — ACETAMINOPHEN 325 MG PO TABS
325 MG | Freq: Once | ORAL | Status: DC
Start: 2021-12-31 — End: 2021-12-31

## 2021-12-31 MED ORDER — LIDOCAINE-EPINEPHRINE 1 %-1:100000 IJ SOLN
1 %-:00000 | INTRAMUSCULAR | Status: DC | PRN
Start: 2021-12-31 — End: 2021-12-31
  Administered 2021-12-31: 20:00:00 10

## 2021-12-31 MED ORDER — ROCURONIUM BROMIDE 50 MG/5ML IV SOLN
50 MG/5ML | INTRAVENOUS | Status: DC | PRN
Start: 2021-12-31 — End: 2021-12-31
  Administered 2021-12-31: 18:00:00 50 via INTRAVENOUS

## 2021-12-31 MED ORDER — BUPIVACAINE-EPINEPHRINE 0.5% -1:200000 IJ SOLN (MIXTURES ONLY)
INTRAMUSCULAR | Status: DC | PRN
Start: 2021-12-31 — End: 2021-12-31
  Administered 2021-12-31: 20:00:00 10

## 2021-12-31 MED ORDER — OXYCODONE HCL 5 MG PO TABS
5 MG | ORAL | Status: AC
Start: 2021-12-31 — End: 2021-12-31
  Administered 2021-12-31: 17:00:00 5 mg via ORAL

## 2021-12-31 MED ORDER — ROCURONIUM BROMIDE 50 MG/5ML IV SOLN
50 MG/5ML | INTRAVENOUS | Status: AC
Start: 2021-12-31 — End: ?

## 2021-12-31 MED ORDER — OXYCODONE HCL 5 MG PO TABS
5 MG | ORAL_TABLET | Freq: Four times a day (QID) | ORAL | 0 refills | Status: AC | PRN
Start: 2021-12-31 — End: 2022-01-05

## 2021-12-31 MED ORDER — LIDOCAINE-EPINEPHRINE 1 %-1:100000 IJ SOLN
1 %-:00000 | INTRAMUSCULAR | Status: DC | PRN
Start: 2021-12-31 — End: 2021-12-31
  Administered 2021-12-31: 18:00:00 15

## 2021-12-31 MED ORDER — NORMAL SALINE FLUSH 0.9 % IV SOLN
0.9 % | Freq: Two times a day (BID) | INTRAVENOUS | Status: DC
Start: 2021-12-31 — End: 2021-12-31

## 2021-12-31 MED ORDER — ACETAMINOPHEN 325 MG PO TABS
325 MG | ORAL | Status: DC | PRN
Start: 2021-12-31 — End: 2021-12-31

## 2021-12-31 MED ORDER — KETOROLAC TROMETHAMINE 10 MG PO TABS
10 MG | ORAL_TABLET | Freq: Four times a day (QID) | ORAL | 0 refills | Status: DC | PRN
Start: 2021-12-31 — End: 2022-01-22

## 2021-12-31 MED ORDER — PHENYLEPHRINE HCL (PRESSORS) 10 MG/ML IV SOLN
10 MG/ML | INTRAVENOUS | Status: AC
Start: 2021-12-31 — End: ?

## 2021-12-31 MED ORDER — PHENYLEPHRINE HCL (PRESSORS) 10 MG/ML IV SOLN
10 MG/ML | INTRAVENOUS | Status: DC | PRN
Start: 2021-12-31 — End: 2021-12-31
  Administered 2021-12-31: 19:00:00 160 via INTRAVENOUS
  Administered 2021-12-31: 18:00:00 80 via INTRAVENOUS
  Administered 2021-12-31: 19:00:00 160 via INTRAVENOUS
  Administered 2021-12-31: 18:00:00 80 via INTRAVENOUS
  Administered 2021-12-31: 19:00:00 160 via INTRAVENOUS
  Administered 2021-12-31: 18:00:00 80 via INTRAVENOUS

## 2021-12-31 MED ORDER — SODIUM CHLORIDE 0.9 % IR SOLN
0.9 % | Status: AC | PRN
Start: 2021-12-31 — End: 2021-12-31
  Administered 2021-12-31: 19:00:00 1000

## 2021-12-31 MED ORDER — ONDANSETRON HCL 4 MG/2ML IJ SOLN
4 MG/2ML | Freq: Once | INTRAMUSCULAR | Status: DC | PRN
Start: 2021-12-31 — End: 2021-12-31

## 2021-12-31 MED ORDER — KETOROLAC TROMETHAMINE 30 MG/ML IJ SOLN
30 MG/ML | INTRAMUSCULAR | Status: DC | PRN
Start: 2021-12-31 — End: 2021-12-31
  Administered 2021-12-31: 20:00:00 15 via INTRAVENOUS

## 2021-12-31 MED ORDER — METOPROLOL TARTRATE 5 MG/5ML IV SOLN
5 MG/ML | INTRAVENOUS | Status: AC
Start: 2021-12-31 — End: ?

## 2021-12-31 MED ORDER — OXYCODONE HCL 5 MG PO TABS
5 MG | ORAL | Status: DC | PRN
Start: 2021-12-31 — End: 2021-12-31

## 2021-12-31 MED ORDER — BUPIVACAINE-EPINEPHRINE 0.5% -1:200000 IJ SOLN (MIXTURES ONLY)
INTRAMUSCULAR | Status: DC | PRN
Start: 2021-12-31 — End: 2021-12-31
  Administered 2021-12-31: 18:00:00 15

## 2021-12-31 MED ORDER — SUGAMMADEX SODIUM 200 MG/2ML IV SOLN
200 MG/2ML | INTRAVENOUS | Status: DC | PRN
Start: 2021-12-31 — End: 2021-12-31
  Administered 2021-12-31: 20:00:00 200 via INTRAVENOUS

## 2021-12-31 MED ORDER — LACTATED RINGERS IV SOLN
INTRAVENOUS | Status: DC
Start: 2021-12-31 — End: 2021-12-31

## 2021-12-31 MED ORDER — DEXAMETHASONE SODIUM PHOSPHATE 4 MG/ML IJ SOLN
4 MG/ML | INTRAMUSCULAR | Status: AC
Start: 2021-12-31 — End: ?

## 2021-12-31 MED ORDER — KETAMINE HCL 50 MG/ML IJ SOLN
50 MG/ML | INTRAMUSCULAR | Status: DC | PRN
Start: 2021-12-31 — End: 2021-12-31
  Administered 2021-12-31 (×3): 10 via INTRAVENOUS

## 2021-12-31 MED ORDER — SUGAMMADEX SODIUM 200 MG/2ML IV SOLN
200 MG/2ML | INTRAVENOUS | Status: AC
Start: 2021-12-31 — End: ?

## 2021-12-31 MED ORDER — KETOROLAC TROMETHAMINE 30 MG/ML IJ SOLN
30 MG/ML | INTRAMUSCULAR | Status: AC
Start: 2021-12-31 — End: ?

## 2021-12-31 MED ORDER — ONDANSETRON HCL 4 MG/2ML IJ SOLN
4 MG/2ML | INTRAMUSCULAR | Status: AC
Start: 2021-12-31 — End: ?

## 2021-12-31 MED ORDER — METOPROLOL TARTRATE 5 MG/5ML IV SOLN
5 MG/ML | INTRAVENOUS | Status: DC | PRN
Start: 2021-12-31 — End: 2021-12-31
  Administered 2021-12-31: 19:00:00 1 via INTRAVENOUS

## 2021-12-31 MED ORDER — DEXTROSE 5 % IV SOLN
5 % | INTRAVENOUS | Status: DC | PRN
Start: 2021-12-31 — End: 2021-12-31
  Administered 2021-12-31: 19:00:00 40 via INTRAVENOUS

## 2021-12-31 MED ORDER — LIDOCAINE HCL (PF) 2 % IJ SOLN
2 % | INTRAMUSCULAR | Status: DC | PRN
Start: 2021-12-31 — End: 2021-12-31
  Administered 2021-12-31: 18:00:00 60 via INTRAVENOUS

## 2021-12-31 MED ORDER — GELATIN ABSORBABLE 100 EX MISC
100 | CUTANEOUS | Status: DC | PRN
Start: 2021-12-31 — End: 2021-12-31
  Administered 2021-12-31: 19:00:00 1 via TOPICAL

## 2021-12-31 MED ORDER — METHYLPREDNISOLONE ACETATE 80 MG/ML IJ SUSP
80 MG/ML | INTRAMUSCULAR | Status: DC | PRN
Start: 2021-12-31 — End: 2021-12-31
  Administered 2021-12-31: 19:00:00 40

## 2021-12-31 MED ORDER — DEXAMETHASONE SODIUM PHOSPHATE 4 MG/ML IJ SOLN
4 MG/ML | INTRAMUSCULAR | Status: DC | PRN
Start: 2021-12-31 — End: 2021-12-31
  Administered 2021-12-31: 19:00:00 4 via INTRAVENOUS

## 2021-12-31 MED ORDER — LIDOCAINE HCL (PF) 2 % IJ SOLN
2 % | INTRAMUSCULAR | Status: AC
Start: 2021-12-31 — End: ?

## 2021-12-31 MED ORDER — NORMAL SALINE FLUSH 0.9 % IV SOLN
0.9 % | INTRAVENOUS | Status: DC | PRN
Start: 2021-12-31 — End: 2021-12-31

## 2021-12-31 MED ORDER — HALOPERIDOL LACTATE 5 MG/ML IJ SOLN
5 MG/ML | Freq: Once | INTRAMUSCULAR | Status: DC | PRN
Start: 2021-12-31 — End: 2021-12-31

## 2021-12-31 MED ORDER — MIDAZOLAM HCL 2 MG/2ML IJ SOLN
2 MG/ML | INTRAMUSCULAR | Status: AC
Start: 2021-12-31 — End: ?

## 2021-12-31 MED ORDER — FENTANYL CITRATE (PF) 100 MCG/2ML IJ SOLN
100 MCG/2ML | INTRAMUSCULAR | Status: DC | PRN
Start: 2021-12-31 — End: 2021-12-31

## 2021-12-31 MED ORDER — PROPOFOL 200 MG/20ML IV EMUL
200 MG/20ML | INTRAVENOUS | Status: DC | PRN
Start: 2021-12-31 — End: 2021-12-31
  Administered 2021-12-31: 19:00:00 50 via INTRAVENOUS
  Administered 2021-12-31: 18:00:00 200 via INTRAVENOUS

## 2021-12-31 MED ORDER — SODIUM CHLORIDE 0.9 % IV SOLN
0.9 % | INTRAVENOUS | Status: DC
Start: 2021-12-31 — End: 2021-12-31

## 2021-12-31 MED ORDER — ONDANSETRON HCL 4 MG/2ML IJ SOLN
4 MG/2ML | INTRAMUSCULAR | Status: DC | PRN
Start: 2021-12-31 — End: 2021-12-31
  Administered 2021-12-31: 19:00:00 4 via INTRAVENOUS

## 2021-12-31 MED ORDER — MEPERIDINE HCL 25 MG/ML IJ SOLN
25 MG/ML | INTRAMUSCULAR | Status: DC | PRN
Start: 2021-12-31 — End: 2021-12-31

## 2021-12-31 MED ORDER — LACTATED RINGERS IV SOLN
INTRAVENOUS | Status: DC | PRN
Start: 2021-12-31 — End: 2021-12-31
  Administered 2021-12-31 (×2): via INTRAVENOUS

## 2021-12-31 MED ORDER — KETAMINE HCL 50 MG/ML IJ SOLN
50 MG/ML | INTRAMUSCULAR | Status: AC
Start: 2021-12-31 — End: ?

## 2021-12-31 MED ORDER — ONDANSETRON 4 MG PO TBDP
4 MG | Freq: Three times a day (TID) | ORAL | Status: DC | PRN
Start: 2021-12-31 — End: 2021-12-31

## 2021-12-31 MED ORDER — STERILE WATER FOR IRRIGATION IR SOLN
Status: DC | PRN
Start: 2021-12-31 — End: 2021-12-31
  Administered 2021-12-31: 19:00:00 1000

## 2021-12-31 MED ORDER — CEFAZOLIN SODIUM-DEXTROSE 2-4 GM/100ML-% IV SOLN
2-4 GM/100ML-% | INTRAVENOUS | Status: AC
Start: 2021-12-31 — End: 2021-12-31
  Administered 2021-12-31: 18:00:00 2000 mg via INTRAVENOUS

## 2021-12-31 MED ORDER — ONDANSETRON HCL 4 MG/2ML IJ SOLN
4 MG/2ML | Freq: Four times a day (QID) | INTRAMUSCULAR | Status: DC | PRN
Start: 2021-12-31 — End: 2021-12-31

## 2021-12-31 MED ORDER — PROPOFOL 200 MG/20ML IV EMUL
200 MG/20ML | INTRAVENOUS | Status: AC
Start: 2021-12-31 — End: ?

## 2021-12-31 MED ORDER — MIDAZOLAM HCL (PF) 2 MG/2ML IJ SOLN
2 MG/ML | Freq: Once | INTRAMUSCULAR | Status: DC | PRN
Start: 2021-12-31 — End: 2021-12-31

## 2021-12-31 MED ORDER — HYDROMORPHONE HCL 1 MG/ML IJ SOLN
1 MG/ML | INTRAMUSCULAR | Status: DC | PRN
Start: 2021-12-31 — End: 2021-12-31

## 2021-12-31 MED FILL — NON-ASPIRIN PAIN RELIEVER 325 MG PO TABS: 325 MG | ORAL | Qty: 3

## 2021-12-31 MED FILL — OXYCODONE HCL 5 MG PO TABS: 5 MG | ORAL | Qty: 1

## 2021-12-31 MED FILL — MIDAZOLAM HCL 2 MG/2ML IJ SOLN: 2 MG/ML | INTRAMUSCULAR | Qty: 2

## 2021-12-31 MED FILL — ONDANSETRON HCL 4 MG/2ML IJ SOLN: 4 MG/2ML | INTRAMUSCULAR | Qty: 2

## 2021-12-31 MED FILL — ROCURONIUM BROMIDE 50 MG/5ML IV SOLN: 50 MG/5ML | INTRAVENOUS | Qty: 5

## 2021-12-31 MED FILL — METOPROLOL TARTRATE 5 MG/5ML IV SOLN: 5 MG/ML | INTRAVENOUS | Qty: 5

## 2021-12-31 MED FILL — DEXAMETHASONE SODIUM PHOSPHATE 4 MG/ML IJ SOLN: 4 MG/ML | INTRAMUSCULAR | Qty: 1

## 2021-12-31 MED FILL — KETAMINE HCL 50 MG/ML IJ SOLN: 50 MG/ML | INTRAMUSCULAR | Qty: 10

## 2021-12-31 MED FILL — LIDOCAINE HCL (PF) 2 % IJ SOLN: 2 % | INTRAMUSCULAR | Qty: 5

## 2021-12-31 MED FILL — BRIDION 200 MG/2ML IV SOLN: 200 MG/2ML | INTRAVENOUS | Qty: 2

## 2021-12-31 MED FILL — PHENYLEPHRINE HCL (PRESSORS) 10 MG/ML IV SOLN: 10 MG/ML | INTRAVENOUS | Qty: 2

## 2021-12-31 MED FILL — SODIUM CHLORIDE 0.9 % IV SOLN: 0.9 % | INTRAVENOUS | Qty: 1000

## 2021-12-31 MED FILL — KETOROLAC TROMETHAMINE 30 MG/ML IJ SOLN: 30 MG/ML | INTRAMUSCULAR | Qty: 1

## 2021-12-31 MED FILL — DIPRIVAN 200 MG/20ML IV EMUL: 200 MG/20ML | INTRAVENOUS | Qty: 20

## 2021-12-31 MED FILL — BD POSIFLUSH 0.9 % IV SOLN: 0.9 % | INTRAVENOUS | Qty: 40

## 2021-12-31 MED FILL — CEFAZOLIN SODIUM-DEXTROSE 2-4 GM/100ML-% IV SOLN: 2-4 GM/100ML-% | INTRAVENOUS | Qty: 100

## 2021-12-31 MED FILL — LACTATED RINGERS IV SOLN: INTRAVENOUS | Qty: 1000

## 2021-12-31 NOTE — Telephone Encounter (Signed)
Responded to pt via phone call in separate encounter.  Konrad Felix, RN  12/31/2021  5:03 PM

## 2021-12-31 NOTE — Anesthesia Pre-Procedure Evaluation (Addendum)
Department of Anesthesiology  Preprocedure Note       Name:  Barbara Elliott   Age:  71 y.o.  DOB:  12/09/1951                                          MRN:  [id number]         Date:  12/31/2021      Surgeon: Moishe SpiceSurgeon(s):  Mats Agren, MD    Procedure: Procedure(s):  LUMBAR LAMINECTOMY DISCECTOMY POSTERIOR L5-S1    Medications prior to admission:   Prior to Admission medications    Medication Sig Start Date End Date Taking? Authorizing Provider   predniSONE (DELTASONE) 20 MG tablet 2 tabs by mouth daily for 5 days 12/10/21   Ranell PatrickElise Michelle Harvey, APRN - NP   Multiple Vitamin (MULTIVITAMIN ADULT PO) Take 1 tablet by mouth daily    Historical Provider, MD   metoprolol succinate (TOPROL XL) 25 MG extended release tablet TAKE 1 TABLET BY MOUTH EVERY DAY FOR BLOOD PRESSURE  Patient taking differently: Take 25 mg by mouth daily 10/06/21   Amy Richardean SaleE Rousseau, APRN - NP   atorvastatin (LIPITOR) 40 MG tablet Take 1 tablet by mouth daily  Patient taking differently: Take 40 mg by mouth every evening 10/06/21   [name], APRN - NP   acetaminophen (TYLENOL) 325 MG tablet Take 650 mg by mouth every 4 hours as needed    Ar Automatic Reconciliation   amLODIPine (NORVASC) 10 MG tablet Take 10 mg by mouth every evening 03/12/21   Ar Automatic Reconciliation   calcium carbonate 1500 (600 Ca) MG TABS tablet Take 600 mg by mouth 2 times daily    Ar Automatic Reconciliation   Cholecalciferol 50 MCG (2000 UT) TABS Take 1 tablet by mouth daily    Ar Automatic Reconciliation   glucosamine-chondroitin 500-400 MG CAPS Take 1 capsule by mouth daily    Ar Automatic Reconciliation   ibuprofen (ADVIL;MOTRIN) 600 MG tablet Take 600 mg by mouth every 6 hours as needed    Ar Automatic Reconciliation       Current medications:    Current Facility-Administered Medications   Medication Dose Route Frequency Provider Last Rate Last Admin   ??? ceFAZolin (ANCEF) 2000 mg in dextrose 4 % 100 mL IVPB (premix)  2,000 mg IntraVENous On Call to OR Mats Agren, MD        ??? acetaminophen (TYLENOL) tablet 975 mg  975 mg Oral Once Mats Agren, MD       ??? oxyCODONE (ROXICODONE) immediate release tablet 5 mg  5 mg Oral On Call to OR Marcille BuffyMats Agren, MD           Allergies:    Allergies   Allergen Reactions   ??? Codeine Shortness Of Breath   ??? Lisinopril Cough       Problem List:    Patient Active Problem List   Diagnosis Code   ??? Encounter for hepatitis C screening test for low risk patient Z11.59   ??? Hypertension I10   ??? 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 Preventative health care Z00.00   ??? 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 Current every day smoker F17.200   ??? Prediabetes R73.03   ??? Left hip pain M25.552   ??? Encounter for immunization Z23   ??? Encounter for subsequent annual wellness visit (AWV) in Medicare patient Z00.00   ???  Vitamin D deficiency E55.9   ??? Right hip pain M25.551   ??? Primary osteoarthritis of right hip M16.11   ??? Primary localized osteoarthrosis M19.91   ??? Low back pain radiating to right leg M54.50, M79.604   ??? Lumbar radiculopathy M54.16   ??? At high risk for falls Z91.81       Past Medical History:        Diagnosis Date   ??? Headache    ??? High blood pressure    ??? High cholesterol    ??? Thyroid disease     states hx of it but has resolved, TSH on 03/25/21 was 3.970   ??? Wears glasses        Past Surgical History:        Procedure Laterality Date   ??? HYSTERECTOMY (CERVIX STATUS UNKNOWN)  2012   ??? IR CHOLECYSTOSTOMY PERCUTANEOUS COMPLETE  1980   ??? LUMBAR DISCECTOMY  1998   ??? OTHER SURGICAL HISTORY      pelvic floor, mesh and raise bladder       Social History:    Social History     Tobacco Use   ??? Smoking status: Some Days     Packs/day: 0.25     Types: Cigarettes   ??? Smokeless tobacco: Never   Substance Use Topics   ??? Alcohol use: Yes     Alcohol/week: 1.0 standard drink     Comment: occ                                Ready to quit: Not Answered  Counseling given: Not Answered      Vital Signs (Current): There were no vitals filed for this visit.                                            BP Readings from Last 3 Encounters:   11/27/21 124/76   11/11/21 138/80   10/14/21 (!) 152/80       NPO Status:                                                                                 BMI:   Wt Readings from Last 3 Encounters:   11/27/21 146 lb (66.2 kg)   11/26/21 149 lb (67.6 kg)   11/11/21 151 lb (68.5 kg)     There is no height or weight on file to calculate BMI.    CBC:   Lab Results   Component Value Date/Time    WBC 8.0 03/25/2021 08:51 AM    RBC 5.06 03/25/2021 08:51 AM    HGB 15.4 03/25/2021 08:51 AM    HCT 46.8 03/25/2021 08:51 AM    MCV 92.5 03/25/2021 08:51 AM    RDW 13.4 03/25/2021 08:51 AM    PLT 330 03/25/2021 08:51 AM       CMP:   Lab Results   Component Value Date/Time    NA 136 03/25/2021 08:51 AM    K 3.7 03/25/2021 08:51 AM  CL 104 03/25/2021 08:51 AM    CO2 28 03/25/2021 08:51 AM    BUN 14 03/25/2021 08:51 AM    CREATININE 0.78 03/25/2021 08:51 AM    GFRAA >60 03/25/2021 08:51 AM    GLUCOSE 98 03/25/2021 08:51 AM    PROT 8.1 03/25/2021 08:51 AM    CALCIUM 9.6 03/25/2021 08:51 AM    BILITOT 1.10 03/25/2021 08:51 AM    ALKPHOS 85 03/25/2021 08:51 AM    AST 22 03/25/2021 08:51 AM    ALT 29 03/25/2021 08:51 AM       POC Tests: No results for input(s): POCGLU, POCNA, POCK, POCCL, POCBUN, POCHEMO, POCHCT in the last 72 hours.    Coags: No results found for: PROTIME, INR, APTT    HCG (If Applicable): No results found for: PREGTESTUR, PREGSERUM, HCG, HCGQUANT     ABGs: No results found for: PHART, PO2ART, PCO2ART, HCO3ART, BEART, O2SATART     Type & Screen (If Applicable):  No results found for: LABABO, LABRH    Drug/Infectious Status (If Applicable):  Lab Results   Component Value Date/Time    HEPCAB NONREACTIVE 03/25/2021 08:51 AM       COVID-19 Screening (If Applicable): No results found for: COVID19        Anesthesia Evaluation    Airway: Mallampati: II  TM distance: >3 FB   Neck ROM: full  Mouth opening: > = 3 FB   Dental:    (+) upper dentures      Pulmonary:   (+) current smoker  (Occasional)                           Cardiovascular:  Exercise tolerance: good (>4 METS),   (+) hypertension:,                   Neuro/Psych:   (+) headaches:,             GI/Hepatic/Renal: Neg GI/Hepatic/Renal ROS            Endo/Other: Negative Endo/Other ROS                    Abdominal:             Vascular:          Other Findings:           Anesthesia Plan      general     ASA 2       Induction: intravenous.    MIPS: Postoperative opioids intended.  Anesthetic plan and risks discussed with patient.      Plan discussed with CRNA.                    Jamie Brookes, MD   12/31/2021

## 2021-12-31 NOTE — Pre Sedation (Deleted)
Chief Complaint   Patient presents with    Back Pain       Lumbar radiculopathy         HPI: The patient is a 71 y.o.-year-old female who presents today for follow-up after physical therapy for leg pain.  She was doing better last time, but now pain has returned.  She think some of that may have been because she was finish she has steroids.    She now describes predictable radiating pain in her right leg, starting in the buttock going down to the calf.  She is using Tylenol, and some rob.  Does help, but she still very limited in standing in particular, but also walking activities.  After short distances, she has to sit down because of the pain.  Does not involve the left leg.  Back itself is not limiting.      Currently the back pain is not limiting.  It is all related to her right leg.  She has not noted progressive weakness but rather the pain that occurs is limiting.     For pain, they are Tylenol and ibuprofen.  She still doing the stretches for physical therapy, but she is not gaining.  She is looking for intervention.     She denies any fevers, chills, or specific night pain.  No bowel or bladder changes.     Past Medical History        Past Medical History:   Diagnosis Date    Headache      High blood pressure      High cholesterol      Wears glasses           Past Surgical History         Past Surgical History:   Procedure Laterality Date    HYSTERECTOMY (CERVIX STATUS UNKNOWN)   2012    IR CHOLECYSTOSTOMY PERCUTANEOUS COMPLETE   1980    LUMBAR DISCECTOMY   1998    OTHER SURGICAL HISTORY         pelvic floor, mesh and raise bladder              Allergies   Allergen Reactions    Codeine Shortness Of Breath    Lisinopril Cough           Current Medication      Current Outpatient Medications:     metoprolol succinate (TOPROL XL) 25 MG extended release tablet, TAKE 1 TABLET BY MOUTH EVERY DAY FOR BLOOD PRESSURE, Disp: 90 tablet, Rfl: 1    atorvastatin (LIPITOR) 40 MG tablet, Take 1 tablet by mouth daily, Disp:  90 tablet, Rfl: 3    acetaminophen (TYLENOL) 325 MG tablet, Take by mouth every 4 hours as needed, Disp: , Rfl:     amLODIPine (NORVASC) 10 MG tablet, Take 10 mg by mouth daily, Disp: , Rfl:     aspirin 81 MG chewable tablet, Take 81 mg by mouth daily, Disp: , Rfl:     calcium carbonate 1500 (600 Ca) MG TABS tablet, Take 600 mg by mouth 2 times daily, Disp: , Rfl:     Cholecalciferol 50 MCG (2000 UT) TABS, Take 1 tablet by mouth daily, Disp: , Rfl:     glucosamine-chondroitin 500-400 MG CAPS, Take 1 capsule by mouth daily, Disp: , Rfl:     ibuprofen (ADVIL;MOTRIN) 600 MG tablet, Take by mouth every 6 hours as needed, Disp: , Rfl:  Examination:   BP 138/80 (Site: Right Upper Arm, Position: Sitting)    Pulse 84    Resp 14    Ht 5\' 1"  (1.549 m)    Wt 151 lb (68.5 kg)    BMI 28.53 kg/m??          Wt Readings from Last 3 Encounters:   11/11/21 151 lb (68.5 kg)   10/21/21 150 lb (68 kg)   10/14/21 150 lb (68 kg)         The patient is awake, alert and oriented times 3 and answering questions appropriately. Affect is friendly and breathing is unlabored.   The patient is no acute distress sitting in the office.  She tolerates examination well.  She is mildly deconditioned and overweight.     Chest: Clear to auscultation bilaterally.    Cardiac:  Regular rate and rhythm without murmur appreciated.      Abdomen:  Positive bowel sounds.     Examination of the spine she stands well balance.  Gentle range of motion is well tolerated.     Neurologic examination show mild straight leg raise on the right negative on the left.  She is able tone heel walk.  Reflexes absent at the Achilles.  No pathologic findings.     Extremity evaluation show good gentle range of motion with mild discomfort towards her trochanters.  No specific weight-bearing pain reproduction.     Vascular evaluation show good distal pulses with minimal pretibial edema.     Radiographic evaluation:     MRI of the lumbar spine from [facility] dated 10/16/2021 was reviewed.  This shows some global degenerative changes in the thoracolumbar junction down to L4, there is sparing of the L4-5 disc.  At L5-S1, there are postsurgical changes on the right side consistent with a diskectomy.  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  There is significant L5-S1 disc space narrowing, but no severe foraminal stenosis.  No other nerve compromise is noted.  No signs of instability.     IMPRESSION:         ICD-10-CM     1. Lumbar radiculopathy  M54.16         2. History of lumbar discectomy  Z98.890         3. 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  She is presenting like lateral recess stenosis, which would be consistent with the findings on the MRI.  Her pain is very consistent, and is quite limiting for her.  She has failed physical therapy, as well as home exercise program.  This been going on for over five months.  We discussed other options including injections, which she has had in the past but did not really want to proceed at this point.  Given the duration of symptoms and the lack of improvement, I think surgical intervention would beneficial.      We discussed some of the risks and benefits including reoperate in which she has had surgery before.  Explained to her that the goal would be to take some of the fragments out, to minimize the narrowing, and therefore lower nerve root a to function better when she is standing and walking.  She understands this is a degenerative process  and she may have recurrence.  She would like to proceed.  We therefore plan to schedule her for a re-exploration on the right-sided L5-S1 with diskectomy.  Info consent was obtained.    Surgical site has been marked.  Orders have been placed.     Plan:      Schedule re-exploration L5-S1 on the right with  diskectomy.     I reviewed my evaluation at length with the patient.  Their questions were answered to their satisfaction.

## 2021-12-31 NOTE — Interval H&P Note (Signed)
Interval Update History & Physical    History & Physical attached to this interval note was reviewed with the patient and the patient indicates there are no changes since seen.   I re-examined  and evaluated the patient at time of pre-op admission.      The surgical site was confirmed by the patient and signed by me. Laterality: Right    Patient found appropriate for the planned procedure and planned medications: Yes    Plan:  The risk, benefits, expected outcome, and alternative to the recommended procedure have been re-reviewd with the patient.  Patient understands and wants to proceed with the procedure.    Electronically signed by Marcille Buffy, MD on 12/31/2021 at 12:35 PM

## 2021-12-31 NOTE — Discharge Instructions (Addendum)
Discharge Instructions - Lumbar Discectomy/Laminectomy    The following guidelines are designed to allow for optimal healing to help you achieve the best possible surgical outcome. If you have any questions or concerns, do not hesitate to call our office at [phone].    CALL YOUR SURGEON IF YOU DEVELOP ANY OF THE FOLLOWING:  Increased pain, swelling, redness in or around the incision site. Any drainage from the incision.  Sudden increase in pain or lower extremity weakness not relieved with rest.  A temperature above 100.5??F or 38.1??C for 24 hours  Abdominal discomfort/pain that persists for longer than 24-48 hours  Redness, warmth, or tenderness in the back of your calf of your leg(s).  A persistent headache that is different when sitting or lying down.  New problems urinating or having control of your bladder or bowel movements  Persistent nausea or vomiting    ACTIVITY:   Low impact activity (for example, walking) promotes healing. Some soreness is to be expected, but stop if you experience unusual pain or discomfort.  Avoid bending or twisting at the waist. Bend at your knees (squat) when picking up objects or ask for assistance.  Avoid any lifting or carrying heavy objects. Avoid forceful pushing, pulling, or jerking motions.  Take precautions to prevent falls and use an assistive device if unsure.  Avoid sitting for longer periods. Sitting for longer periods of time may add to your pain. Take a 10-minute break to get up and move around or lie down before sitting again. Reclining is preferred.  Sexual activity can be resumed as tolerated. Avoid uncomfortable positions.    INCISION CARE:  Your incisions are covered with a topical liquid skin adhesive that holds the wound edges together. The film will usually remain in place for 2-3 weeks, and then naturally slough off your skin.  You do not need to keep the incisions covered unless there is drainage from the wound. If there is a dressing on the incision,  it can be  removed 2 days after surgery.  Have someone inspect your incisions at least once daily for any drainage, swelling or redness.  Do not scratch, rub or pick at your incisions.  Do not apply liquid or ointment medications or any lotions or powders to your incisions.    BATHING:  You may occasionally and briefly wet your wound in the shower. After showering, gently blot your incisions dry with a soft towel.  Avoid soaking the incisions (bath, swimming pool, hot tub, spa, etc) for at least 3 weeks.    MEDICATIONS:  Resume your regular medications, unless otherwise instructed.  You may be given a prescription for pain medication when you are discharged from the hospital. Use pain medication as instructed when needed. You should not expect that the pain medications will completely remove all of your pain, but they will help.  NARCOTICS: do not increase the prescribed dose without checking with your doctor or nurse.  Neurontin??/Gabapentin: If you have been taking these before surgery, you should keep taking them unless you have been told not to.  Tylenol?? (acetaminophen): you may take up to 3000 milligrams per day. Percocet and Vicoden also contain Tylenol??. DO NOT TAKE Tylenol?? if you have liver disease. Do not take it without checking with your doctor.  You can take non-steroidal anti-inflammatory drugs (NSAIDS - i.e. Ibuprofen, Motrin??, Advil??, Aleve??, etc).  You may be prescribed Toradol for the immediate postoperative period.  Do not take Toradol with another nonsteroidal anti-inflammatory medication.  If you need a prescription refill, please contact our office during business hours. Expect to give 48 hours' notice before the need of a refill. In order for your pain medication to be refilled, you may have to come into the office to see your surgical team for an evaluation. Under no circumstances will narcotics be refilled or ordered outside of our normal business hours.  CONSTIPATION: Even if you have  regular bowel movements prior to having surgery, you are likely to experience some post-operative constipation. Exposure to anesthesia and narcotics, altered diet and fluid intake, and reduced activity contribute to constipation. There are several over-the-counter medications that can help reduce constipation  Simulant laxatives (Ex-Lax??, Senokot??) act to move stools thru your intestines  Stool softeners (Colace??) is helpful when constipation is accompanied by hard stool causing pain or difficulty  If these are not enough, the addition of a stronger laxative (Philips Milk of Magnesia??, Miralax??) may be considered    DIET:  A healthy, balanced diet will aid in your recovery. Include plenty of water, fruits and vegetables will help reduce constipation.    DRIVING:  You may drive 72 hours after surgery if off narcotic pain medications(Tramadol, Hydrocodone, Oxycodone, etc).  You may ride in an automobile for short distances as tolerated.  Do not drive if you are taking any narcotics for pain control.    SMOKING:   Nicotine decreases blood flow to your bones and muscles and is known to interfere with fusion healing. It is strongly recommended that for post-op recovery and long-term spine health that you not smoke.  Refrain from smoking after surgery. This includes the avoidance of chewing tobacco, electronic nicotine cigarettes, nicotine patches and nicotine gum.  Avoid exposure to second-hand smoke.  The [place] [employer] Hel[employer] providers free, confidential support by phone for smokers who want to quit. Call the Helpline toll-free at (214) 487-5478.    RETURNING TO WORK:  Do not return to work until instructed by Designer, industrial/product  Returning to work depends upon both the type of surgery and the kind of job you have.   If any medical leave or short-term disability paperwork needs to be completed by your provider, please bring it to your follow-up appointment.    FOLLOW-UP APPOINTMENT  You will need to be seen approximately 2-3  weeks after surgery for a follow-up examination of your incision and follow-up exam. The need for additional appointments will be determined after that visit.  If you need to change your appointment, please call our office at [phone].     [facility]  [address]  [place] [address]  [phone]         [facility] Surgical Discharge Instructions    What you NEED TO KNOW after having anesthesia:  It may stay in your system for 24 hours this may make you feel tired.   NO driving OR operating heavy machinery for 24 hours.  NO drinking alcohol for 24 hours  Avoid sudden changes in body position or head movements as this can make you dizzy and nauseated. Make sure to pause in between position changes and take your time getting up to walk.   Nausea can be a very undesirable side effect of many pain medications and anesthesia that you received. Important things to note at home to help with nausea:  Start with small amounts of food first, you may progress to normal diet as tolerated. Be cautious about spicy or greasy foods as this may upset  your stomach.   Eat food BEFORE taking pain medication  Drink plenty of clear liquids to stay hydrated (water, Gatorade, ginger ale)  Resting for the next 24 is important for your successful recovery.   If you were prescribed anti-nausea medication (Zofran, Promethazine) you may take this with your pain medication. If you were not prescribed this and are having persistent nausea, contact your surgeon's office.   Stay AHEAD of your PAIN at home:  Take your pain medication BEFORE your pain becomes INTOLERABLE it may take up to 1 hour before pain relief occurs.   ICE & ELEVATION. Avoid standing upright for long periods or having surgical extremity down below your heart.   If pain is getting significantly worse despite pain medication call your surgeons office.     What to do if there is BLOOD ON YOUR DRESSING?  If excessive or unexpected drainage occurs, please  contact your surgeon.  Before calling your surgeons office, you may want to mark the spot with a marker or pen, so edges are well defined. It may be beneficial to put the exact time, this will allow healthcare providers to determine how much bleeding occurs over the course of time.  Signs of Infection - Call your Surgeons Office If you are experiencing any of these symptoms.  Fever over 101 F or Higher   Chills  Increased pain not relieved by pain medication  Increased swelling and/or redness around incision   Persistent Nausea & Vomiting

## 2021-12-31 NOTE — Brief Op Note (Signed)
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    Electronically signed by Marcille Buffy, MD on 12/31/2021 at 3:00 PM

## 2021-12-31 NOTE — Telephone Encounter (Signed)
Called and spoke with pt. Advised that Dr Rose Phi reviewed her question regarding injections to hips post op and stated she may proceed with them as scheduled.  Konrad Felix, RN  12/31/2021  9:32 AM

## 2021-12-31 NOTE — Anesthesia Post-Procedure Evaluation (Signed)
Department of Anesthesiology  Postprocedure Note    Patient: Barbara Elliott  MRN: [id number]  Birthdate: 03-23-1951  Date of evaluation: 12/31/2021      Procedure Summary     Date: 12/31/21 Room / Location: [facility] 01 / [facility]    Anesthesia Start: 1250 Anesthesia Stop: 1459    Procedure: LUMBAR LAMINECTOMY DISCECTOMY POSTERIOR L5-S1 (Right: Spine Lumbar) Diagnosis:       Lumbar radiculopathy      Lumbar herniated disc      (Lumbar radiculopathy [M54.16])      (Lumbar herniated disc [M51.26])    Surgeons: Mats Agren, MD Responsible Provider: Jamie Brookes, MD    Anesthesia Type: General ASA Status: 2          Anesthesia Type: General    Aldrete Phase I: Aldrete Score: 10    Aldrete Phase II:        Anesthesia Post Evaluation    Patient location during evaluation: PACU  Patient participation: complete - patient participated  Level of consciousness: responsive to verbal stimuli and awake  Airway patency: patent  Nausea & Vomiting: no nausea and no vomiting  Complications: no  Cardiovascular status: blood pressure returned to baseline and hemodynamically stable  Respiratory status: acceptable  Hydration status: stable  Multimodal analgesia pain management approach

## 2021-12-31 NOTE — Other (Signed)
Patient ambulated in hallway to bathroom with steady gait with supervision of two staff members. No dizziness, lightheadedness, or nausea noted. Patient able to return to bed at this time and IV discontinued. Patient agreeable to discharge at this time.

## 2021-12-31 NOTE — H&P (Signed)
CC:   No chief complaint on file.       HPI: The patient is a 71 y.o.-year-old female who presents today for follow-up after physical therapy for leg pain.  She was doing better last time, but now pain has returned.  She think some of that may have been because she was finish she has steroids.    She now describes predictable radiating pain in her right leg, starting in the buttock going down to the calf.  She is using Tylenol, and some rob.  Does help, but she still very limited in standing in particular, but also walking activities.  After short distances, she has to sit down because of the pain.  Does not involve the left leg.  Back itself is not limiting.     Currently the back pain is not limiting.  It is all related to her right leg.  She has not noted progressive weakness but rather the pain that occurs is limiting.    For pain, they are Tylenol and ibuprofen.  She still doing the stretches for physical therapy, but she is not gaining.  She is looking for intervention.    She denies any fevers, chills, or specific night pain.  No bowel or bladder changes.    Past Medical History:   Diagnosis Date    Headache     High blood pressure     High cholesterol     Thyroid disease     states hx of it but has resolved, TSH on 03/25/21 was 3.970    Wears glasses      Past Surgical History:   Procedure Laterality Date    HYSTERECTOMY (CERVIX STATUS UNKNOWN)  2012    IR CHOLECYSTOSTOMY PERCUTANEOUS COMPLETE  1980    LUMBAR DISCECTOMY  1998    OTHER SURGICAL HISTORY      pelvic floor, mesh and raise bladder     Allergies   Allergen Reactions    Codeine Shortness Of Breath    Lisinopril Cough       No current outpatient medications on file.      Examination:   BP (!) 147/85    Pulse 79    Temp 98.1 ??F (36.7 ??C) (Temporal)    Resp 16    Ht 5\' 1"  (1.549 m)    Wt 146 lb (66.2 kg)    SpO2 96%    BMI 27.59 kg/m??     Wt Readings from Last 3 Encounters:   12/31/21 146 lb (66.2 kg)   11/27/21 146 lb (66.2 kg)   11/26/21 149 lb (67.6  kg)        The patient is awake, alert and oriented times 3 and answering questions appropriately. Affect is friendly and breathing is unlabored.   The patient is no acute distress sitting in the office.  She tolerates examination well.  She is mildly deconditioned and overweight.    Chest: Clear to auscultation bilaterally.    Cardiac:  Regular rate and rhythm without murmur appreciated.      Abdomen:  Positive bowel sounds.     Examination of the spine she stands well balance.  Gentle range of motion is well tolerated.    Neurologic examination show mild straight leg raise on the right negative on the left.  She is able tone heel walk.  Reflexes absent at the Achilles.  No pathologic findings.    Extremity evaluation show good gentle range of motion with mild discomfort towards her trochanters.  No specific weight-bearing pain reproduction.    Vascular evaluation show good distal pulses with minimal pretibial edema.    Radiographic evaluation:    MRI of the lumbar spine from [facility] dated 10/16/2021 was reviewed.  This shows some global degenerative changes in the thoracolumbar junction down to L4, there is sparing of the L4-5 disc.  At L5-S1, there are postsurgical changes on the right side consistent with a diskectomy.  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  There is significant L5-S1 disc space narrowing, but no severe foraminal stenosis.  No other nerve compromise is noted.  No signs of instability.    IMPRESSION:      ICD-10-CM    1. Lumbar radiculopathy  M54.16       2. 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  She is presenting like lateral recess stenosis, which would be consistent with the findings on the MRI.  Her pain is very consistent, and is quite limiting for her.   She has failed physical therapy, as well as home exercise program.  This been going on for over five months.  We discussed other options including injections, which she has had in the past but did not really want to proceed at this point.  Given the duration of symptoms and the lack of improvement, I think surgical intervention would beneficial.      We discussed some of the risks and benefits including reoperate in which she has had surgery before.  Explained to her that the goal would be to take some of the fragments out, to minimize the narrowing, and therefore lower nerve root a to function better when she is standing and walking.  She understands this is a degenerative process and she may have recurrence.  She would like to proceed.  We therefore plan to schedule her for a re-exploration on the right-sided L5-S1 with diskectomy.  Info consent was obtained.    Antibiotics have been ordered.  Patient has been marked.    Plan:     Proceed with re-exploration L5-S1 on the right with diskectomy.    I reviewed my evaluation at length with the patient.  Their questions were answered to their satisfaction.  T

## 2021-12-31 NOTE — Telephone Encounter (Signed)
Pt rescheduled.

## 2021-12-31 NOTE — Op Note (Addendum)
Operative Note      Patient: Barbara Elliott  Date of Birth: 04-14-1951  MRN: [id number]    Date of Procedure: 12/31/2021    Pre-Op Diagnosis: Lumbar radiculopathy [M54.16]  Lumbar herniated disc [M51.26]  H/o discectomy    Post-Op Diagnosis: Same        Procedure:   1. Re-exploration right L5-S1 with lateral recess decompression and diskectomy.     2. Erector spinae block    Surgeon:  Rafia Shedden      Assistant: Scrub Person First: Janeann Merl, RN  Physician Assistant: Charyl Dancer, PA     Anesthesia:  GET     EBL: 25 ml     Complications:  none     Indications:  Patient is a 71 y.o. female, who has significant radiating leg pain.  The patient has failed to improve with conservative measures.  MRI has been obtained, showing scar formation from prior surgery as well as a nerve compression, consistent with the patients complaint of leg pain.  Due to patient's failure to improve, and due to the patient's significant limitations in daily activities, they have opted for surgical intervention.  The possible risk and benefits including but not limited to infection, nerve injury, and continued pain were reviewed in detail with the patient.  Their questions were answered to their satisfaction, and they were willing to proceed.  Informed consent was obtained.     Description:  After patient was properly identified and informed consent was checked, the surgical site was marked and the patient was brought to the operating room where general anesthesia was induced. The patient was carefully placed in a prone position on the [place] table with all bony prominences well padded.  C-arm was brought into place and the  appropriate anatomy visualized. The back was prepped and draped in the normal sterile fashion.     Perioperative antibiotics was given using 2 g of Kefzol within 30 minutes of incision.  Time-out was taken and found to be complete.     Injection needle was then placed in the lumbar spine, and the appropriate area was  clearly identified by C-arm.  Skin and deep tissue was injected with Marcaine mixed with lidocaine, and an incision carried down in the midline, using the old scar as landmark.  It was carried down sharply through the scar down to the fascia which was incised to the right of the midline.  The paraspinal muscles and the scar were elevated, and a deep retractor was placed.  A probe was placed visualizing the appropriate level by C-arm.  The scar was then removed from the remaining inferior aspect of the L5 lamina and the superior aspect of the S1 lamina.  The scar could then be released from the and lamina, and curette placed underneath.  Power was then used to create laminotomies in both the superior aspect of S1 as well as the inferior aspect of L5.  Is allowed for exposure more normal tissue.  Proximally, the L5 nerve root could be visualized, dissection was carried out distal to the nerve root.  Lateral recess was decompressed and the lateral aspect of the neural elements could be traced for the distally down to the pedicle of S1.  Scar was released, until the neural structures were mobilized quite easily medially.  Once is mobilize, multiple small fragments could be expressed underneath the dura.  Dissection was continued distally gently a dissection underneath the S1 nerve root, as well as lateral to the nerve  root where there was scar tissue as well as some disc fragments encased in scar.  These pieces could be removed, leaving the scar on the shoulder of the nerve root.  Further dissection was carried out distally, elevating the scar from the bony areas well the decompression was carried until a probe could be placed in the beginning of the S1 foramen.  The S1-1 nerve root was then further released allowing it to mobilize medially.  Once the nerve was completely decompressed, bleeding was controlled using bipolar cautery in the epidural space.  Bone wax was placed on the bleeding bony surfaces,  and soaked  Gel-Foam was loosely placed.  To maximize hemostasis, Gelfoam was also placed.  Once the epidurals veins were controlled, bleeding was checked in the muscle layer.      Deep Gelfoam was then removed.  Wound was again irrigated.  40 mg of Depo-Medrol was then placed in surgical site, and Gelfoam placed.  Most this was then removed leaving a small layer on top of the steroid.       The wound was then copiously irrigated, and closed in layers, placing subcutaneous skin stitch and SurgiSeal.  Dressing was placed.     The patient was carefully returned to a supine position on a stretcher without incident.  The patient was extubated in the operating room, and transferred to the recovery room breathing on their own in stable condition.    The assistant was present during the entire operation, performing retraction and other activities to maximize the patient's safety and the surgical efficiency.      No intraoperative complications were appreciated.  The patient's family was contacted after the procedure.

## 2022-01-01 ENCOUNTER — Ambulatory Visit: Payer: MEDICARE | Attending: Orthopaedic Surgery | Primary: Family

## 2022-01-07 ENCOUNTER — Encounter: Attending: Internal Medicine | Primary: Family

## 2022-01-07 DIAGNOSIS — M1991 Primary osteoarthritis, unspecified site: Secondary | ICD-10-CM

## 2022-01-09 ENCOUNTER — Encounter

## 2022-01-16 NOTE — Telephone Encounter (Signed)
Spoke with the patient postoperatively.

## 2022-01-22 ENCOUNTER — Ambulatory Visit: Admit: 2022-01-22 | Discharge: 2022-01-22 | Payer: MEDICARE | Attending: Orthopaedic Surgery | Primary: Family

## 2022-01-22 DIAGNOSIS — Z9889 Other specified postprocedural states: Secondary | ICD-10-CM

## 2022-01-22 NOTE — Progress Notes (Signed)
CC: Post-Op Check (L5-S1 RT lumbar decompression 12/31/21)      HPI: The patient is a 71 y.o. y.o.-year-old female who presents today for follow up three weeks after her re-exploration with diskectomy and lateral recess decompression.  She describes that she still has ache, which runs down her right leg.  It tends to improved by resting or changing position.  Standing in one place tend to bother her the most.    Currently the pain is rated 5/10, and more of it proximal.  She also describes pain on the left side, where she has pain around trochanter.  She is planning to have an injection for that which is been scheduled.    They are using Tylenol as needed.  She has it takes in the morning, since the morning tends to bother her the most.    She denies any fevers, chills, or specific night pain.  She has not had any wound issues.  His symptoms seems to be getting less predictable and better.      Current Outpatient Medications:     Multiple Vitamin (MULTIVITAMIN ADULT PO), Take 1 tablet by mouth daily, Disp: , Rfl:     metoprolol succinate (TOPROL XL) 25 MG extended release tablet, TAKE 1 TABLET BY MOUTH EVERY DAY FOR BLOOD PRESSURE, Disp: 90 tablet, Rfl: 1    atorvastatin (LIPITOR) 40 MG tablet, Take 1 tablet by mouth daily (Patient taking differently: Take 40 mg by mouth every evening), Disp: 90 tablet, Rfl: 3    acetaminophen (TYLENOL) 325 MG tablet, Take 650 mg by mouth every 4 hours as needed, Disp: , Rfl:     amLODIPine (NORVASC) 10 MG tablet, Take 10 mg by mouth every evening, Disp: , Rfl:     calcium carbonate 1500 (600 Ca) MG TABS tablet, Take 600 mg by mouth 2 times daily, Disp: , Rfl:     Cholecalciferol 50 MCG (2000 UT) TABS, Take 1 tablet by mouth daily, Disp: , Rfl:     glucosamine-chondroitin 500-400 MG CAPS, Take 1 capsule by mouth daily, Disp: , Rfl:     ibuprofen (ADVIL;MOTRIN) 600 MG tablet, Take 600 mg by mouth every 6 hours as needed, Disp: , Rfl:         ROS: Musculoskeletal and general systems  were reviewed with the patient and are negative except as above in the HPI.     Examination:   BP 122/76 (Site: Left Upper Arm, Position: Sitting)    Pulse 72    Resp 14    Ht 5\' 1"  (1.549 m)    Wt 146 lb (66.2 kg)    BMI 27.59 kg/m??   Wt Readings from Last 3 Encounters:   01/22/22 146 lb (66.2 kg)   12/31/21 146 lb (66.2 kg)   11/27/21 146 lb (66.2 kg)      The patient is awake, alert and oriented times 3 and answering questions appropriately. Affect is friendly and breathing is unlabored. They are in no acute distress.  She tolerates examination well.  She moves around quite freely.    Examination of the spine shows the incision to be healing well.  No significant swelling or erythema.    Neurologic exam shows negative straight leg raise bilaterally.  She has grossly good strength by manual testing.    Extremity evaluation show tenderness over the left trochanteric region, with minimal proximal tenderness on the right leg.  No specific directional pain reproduction.    IMPRESSION:  ICD-10-CM    1. S/P lumbar discectomy  Z98.890       2. Sciatica of right side  M54.31             ASSESSMENT:    The patient is doing quite well at this point.  Hopefully her pain will continue to diminish, especially is somewhat unpredictable.  I encouraged her to continue with stretching exercises, and add more exercise as she sees fit.  She has done physical therapy in the past, and also has a disc at home to do for exercises.      She should not have any restrictions at this point.  She can progress in a gradual fashion.    PLAN:     Follow-up as needed.    I reviewed my evaluation at length with the patient.  They are comfortable with the information. Their questions were answered to their satisfaction.  They do know to contact us regarding any questions, problems, or significant change in their symptoms as discussed.    Thank you for allowing me to participate the care your patient. Please do not hesitate to contact my  office if we can be of further assistance.    Sincerely,    Verlin Uher MD      No orders of the defined types were placed in this encounter.

## 2022-01-29 ENCOUNTER — Ambulatory Visit: Admit: 2022-01-29 | Discharge: 2022-01-29 | Payer: MEDICARE | Attending: Internal Medicine | Primary: Family

## 2022-01-29 DIAGNOSIS — M25552 Pain in left hip: Secondary | ICD-10-CM

## 2022-01-29 DIAGNOSIS — M25551 Pain in right hip: Secondary | ICD-10-CM

## 2022-01-29 MED ORDER — METHYLPREDNISOLONE ACETATE 40 MG/ML IJ SUSP
40 MG/ML | Freq: Once | INTRAMUSCULAR | Status: AC
Start: 2022-01-29 — End: 2022-01-29
  Administered 2022-01-29: 21:00:00 40 mg via INTRA_ARTICULAR

## 2022-01-29 NOTE — Progress Notes (Signed)
[facility]  [address]  [place] [address]  Dept: [phone]      Ultrasound Report:  Exam Date/Time: 01/29/2022 3:33 PM   Patient Name: Barbara Elliott   Patient DOB: 22-Mar-1951   Patient MRN: [id number]   Equipment / Probe: Butterfly iQ+     Exam:   CPT Code:   20611 Arthrocentesis Major joint (knee, hip, shoulder)}    Indication:  M16.1  Hip OA    Hip Pain  M25.552    Protocol used:   Kissin, E. USSONAR Protocols. [place]. 2017   Talbert Cage. Musculoskeletal Ultrasound in Rheumatology Review. Ambulance person [place]; 2016    Procedure Note  Therapeutic Arthrocentesis with Ultrasound Guidance to the LEFT Hip Joint for symptomatic Osteoarthritis which failed conservative measures and impacts activities of daily life.       Clinical History: Medical, surgical, social, and family history were reviewed. They are unchanged.     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       LEFT Hip. Anterior Lontigudinal. In-plane needle path. Tip within joint capsule.       LEFT Hip. Anterior Lontigudinal. In-plane needle path. Post injection.     Procedure: After reviewing indications, potential treatment benefit, treatment  alternatives, and discussing potential risks - including but not limited to  increased pain, infection, headache, bleeding, and the remote possibility of  catastrophic neurologic injury or death - the patient elected to proceed with  today's injection. A copy of the procedure consent form was offered to the  patient. Time-out was taken to confirm injection details and site was marked.    Current medications and allergies were reviewed.    Patient was laying on the exam table.  Proximal anterior left lower extremity was exposed, prepared with chlorhexedine and vapo-coolant spray. Under sterile technique, needle tip was advanced to within the joint capsule using ultrasound guidance. Introduction stylette was removed and medication containing syringe was secured. The medication was delivered smoothly. Needle removed and bandage applied. Key images are as above.     Complications: There were no noted complications    Discharge disposition: Ambulatory discharge to residence    Comments: Uneventful injection preformed - the patient tolerated the procedure  well.    Impression: Successful ultrasound guided injection.    Plan: Will follow up to assess the clinical impact of this procedure.              Grant Ruts, DO  [facility]  01/29/2022   3:33 PM

## 2022-02-10 ENCOUNTER — Ambulatory Visit: Admit: 2022-02-10 | Discharge: 2022-02-10 | Payer: MEDICARE | Attending: Orthopaedic Surgery | Primary: Family

## 2022-02-10 DIAGNOSIS — Z9889 Other specified postprocedural states: Secondary | ICD-10-CM

## 2022-02-10 NOTE — Progress Notes (Signed)
CC: Back Pain (S/P L5-S1 RT lumbar decompression 12/31/21, assess swelling @ lumbar surgical site)      HPI: The patient is a 71 y.o. y.o.-year-old female who presents today for follow up regarding some swelling around the incision.  She noted that about a week ago, when she was taking a shower.  She does not describe any pain, or drainage.  The have not noticed any erythema, although her husband marked the swelling itself.    Currently the pain is rated 3/10, mostly an achy on her leg.  Bothers him mostly in the morning when she gets going, but no sharp stabbing pain.  She is better sitting and resting, with increased symptoms standing.  Walking is somewhat better.    They are using Tylenol and ibuprofen as needed.      She denies any fevers, chills, or specific night pain.  No progressive weakness.  No progressive back pain.  No history of headaches or neck pain.      Current Outpatient Medications:     Multiple Vitamin (MULTIVITAMIN ADULT PO), Take 1 tablet by mouth daily, Disp: , Rfl:     metoprolol succinate (TOPROL XL) 25 MG extended release tablet, TAKE 1 TABLET BY MOUTH EVERY DAY FOR BLOOD PRESSURE, Disp: 90 tablet, Rfl: 1    atorvastatin (LIPITOR) 40 MG tablet, Take 1 tablet by mouth daily (Patient taking differently: Take 40 mg by mouth every evening), Disp: 90 tablet, Rfl: 3    acetaminophen (TYLENOL) 325 MG tablet, Take 650 mg by mouth every 4 hours as needed, Disp: , Rfl:     amLODIPine (NORVASC) 10 MG tablet, Take 10 mg by mouth every evening, Disp: , Rfl:     calcium carbonate 1500 (600 Ca) MG TABS tablet, Take 600 mg by mouth 2 times daily, Disp: , Rfl:     Cholecalciferol 50 MCG (2000 UT) TABS, Take 1 tablet by mouth daily, Disp: , Rfl:     glucosamine-chondroitin 500-400 MG CAPS, Take 1 capsule by mouth daily, Disp: , Rfl:     ibuprofen (ADVIL;MOTRIN) 600 MG tablet, Take 600 mg by mouth every 6 hours as needed, Disp: , Rfl:       ROS: Musculoskeletal and general systems were reviewed with the patient  and are negative except as above in the HPI.     Examination:   There were no vitals taken for this visit.  Wt Readings from Last 3 Encounters:   01/22/22 146 lb (66.2 kg)   12/31/21 146 lb (66.2 kg)   11/27/21 146 lb (66.2 kg)      The patient is awake, alert and oriented times 3 and answering questions appropriately. Affect is friendly and breathing is unlabored. They are in no acute distress.  She tolerates examination well.  .    Examination of the spine shows incision to be well-healed with slight she rotation distally.  There appeared to be somewhat of fluid underneath, but there is no erythema or tenderness on palpation.  The swelling seems to be soft.      IMPRESSION:       ICD-10-CM    1. S/P lumbar discectomy  Z98.890       2. Abnormal surgical wound, initial encounter  T81.9XXA             ASSESSMENT:    The patient seems to be doing well.  Into wound looks more like hematoma, although do not believe we should drainage.  It should resolve on its own.  We discussed the potential of a delayed CSF leak, without headache, without tension, still than he was monitor it.      Encouraged to continue to progress with activities while monitor his neurologic function.  Should she develop any specific progressive symptoms around her incision, or signs of infection, she will contact us.  Also discussed the possibility spinal headache, which she has not had so far.      As long as things calm down, she does not get more swelling, it everything goes away, she will follow-up as needed.    PLAN:     Follow-up as needed.      Continue to monitor her wound.    I reviewed my evaluation at length with the patient.  They are comfortable with the information. Their questions were answered to their satisfaction.  They do know to contact us regarding any questions, problems, or significant change in their symptoms as discussed.    Thank you for allowing me to participate in the care your patient.  We will keep you informed  regarding their progress.  Please do not hesitate to contact my office if we can be of further assistance.    Sincerely,    Arnold Depinto MD      No orders of the defined types were placed in this encounter.

## 2022-04-02 ENCOUNTER — Ambulatory Visit: Payer: MEDICARE | Attending: Family | Primary: Family

## 2022-04-02 ENCOUNTER — Ambulatory Visit: Attending: Family | Primary: Family

## 2022-04-06 MED ORDER — METOPROLOL SUCCINATE ER 25 MG PO TB24
25 MG | ORAL_TABLET | ORAL | 1 refills | Status: AC
Start: 2022-04-06 — End: ?

## 2022-04-06 NOTE — Telephone Encounter (Signed)
Name from pharmacy: METOPROLOL SUCC ER 25 MG TAB          Will file in chart as: metoprolol succinate (TOPROL XL) 25 MG extended release tablet    Sig: TAKE 1 TABLET BY MOUTH EVERY DAY FOR BLOOD PRESSURE    Disp:  90 tablet    Refills:  1    Start: 04/06/2022    Class: Normal    Non-formulary    Last ordered: 6 months ago by Ruthine Dose, APRN - NP Last refill: 01/04/2022    Rx #: [id number]    Beta-Blockers Protocol Passed 04/06/2022 01:32 AM   Protocol Details  Last Pulse reading greater than 50 recorded within past year    Visit with authorizing provider in past 9 months or upcoming 90 days          Per protocol, rx sent

## 2022-04-14 ENCOUNTER — Encounter

## 2022-04-15 ENCOUNTER — Inpatient Hospital Stay: Admit: 2022-04-15 | Payer: MEDICARE | Primary: Family

## 2022-04-15 DIAGNOSIS — Z1231 Encounter for screening mammogram for malignant neoplasm of breast: Secondary | ICD-10-CM

## 2022-04-16 ENCOUNTER — Encounter

## 2022-04-22 ENCOUNTER — Ambulatory Visit: Payer: MEDICARE | Primary: Family

## 2022-04-22 DIAGNOSIS — R928 Other abnormal and inconclusive findings on diagnostic imaging of breast: Secondary | ICD-10-CM

## 2022-04-27 ENCOUNTER — Ambulatory Visit: Admit: 2022-04-27 | Discharge: 2022-04-27 | Payer: MEDICARE | Attending: Family | Primary: Family

## 2022-04-27 DIAGNOSIS — Z0000 Encounter for general adult medical examination without abnormal findings: Secondary | ICD-10-CM

## 2022-04-27 MED ORDER — GABAPENTIN 100 MG PO CAPS
100 MG | ORAL_CAPSULE | Freq: Three times a day (TID) | ORAL | 1 refills | Status: DC
Start: 2022-04-27 — End: 2022-04-27

## 2022-04-27 MED ORDER — GABAPENTIN 100 MG PO CAPS
100 MG | ORAL_CAPSULE | Freq: Every evening | ORAL | 1 refills | Status: DC
Start: 2022-04-27 — End: 2022-06-10

## 2022-04-27 MED ORDER — PNEUMOCOCCAL 20-VAL CONJ VACC 0.5 ML IM SUSY
0.5 ML | Freq: Once | INTRAMUSCULAR | 0 refills | Status: AC
Start: 2022-04-27 — End: 2022-04-27

## 2022-04-27 NOTE — Assessment & Plan Note (Signed)
Chronic recurrent  Discussed osteopathic medicine  Discussed pool therapy/stretching/exercises  Gabapentin to help with pain  nsaid therapy to help with pain  Monitor for improvement

## 2022-04-27 NOTE — Progress Notes (Signed)
[facility]   [address]  [place] [address]    MEDICARE ANNUAL WELLNESS VISIT       CHIEF COMPLAINT   Barbara Elliott is a 71 y.o. female who presents today for Medicare AWV     HISTORY OF PRESENT ILLNESS     Patient is here for her annual wellness exam.    She continues to struggle with musculoskeletal.   She had surgery and that helped with the sciatic pain.  She has been alternating tylenol and ibuprofen.  She does do it 3 times per day. She states she would like something that could give her some relief.  She states she has never taken gabapentin, she is aware of it.  She has completed PT twice- the first time it was for the left hip but when the sciatica came it made it worse.  She is not doing any pool therapy-   She is able to get up and do things but she has to sit down for a while.  At night is quite a bit more- in PT she was tight in her legs.  She has been stretching every morning.    She has been watching aging backwards- she ordered it and has not started to do it.   Over the past two months she has felt the arthritis in her hips and knees.     Reviewed on today's visit patient's past medical/surgical history, family/social history, medications allergies, previous lab work and imaging studies immunizations and preventive care      PHYSICAL EXAM     General:  Pleasant, well-dressed and groomed, good historian  Eyes: PERRLA, non-injected  Ears: tympanic membranes are non-erythematous and in the neutral position, EAM without erythema or discharge  Nose: patent  Oropharynx: oral mucosa moist without lesions, no pharyngeal injection, tonsillar hypertrophy or exudate  Neck: no adenopathy, thyromegaly, masses, or bruits  Heart: regular rate and rhythm without murmur  Lungs: clear to auscultation  Abdomen: soft, nontender, no masses or organomegaly  Extremities: no edema, pulses are normal  Musculoskeletal:  No joint swelling or tenderness. Normal gait.  Neurologic:  CNII-XII  intact. Normal strength, sensation and reflexes throughout.  Skin:  No rash or suspicious skin lesions    MEDICATIONS     Current Outpatient Medications   Medication Sig    gabapentin (NEURONTIN) 100 MG capsule Take 1 capsule by mouth nightly for 60 days. Intended supply: 90 days    metoprolol succinate (TOPROL XL) 25 MG extended release tablet TAKE 1 TABLET BY MOUTH EVERY DAY FOR BLOOD PRESSURE    Multiple Vitamin (MULTIVITAMIN ADULT PO) Take 1 tablet by mouth daily    atorvastatin (LIPITOR) 40 MG tablet Take 1 tablet by mouth daily (Patient taking differently: Take 1 tablet by mouth every evening)    acetaminophen (TYLENOL) 325 MG tablet Take 2 tablets by mouth every 4 hours as needed    amLODIPine (NORVASC) 10 MG tablet Take 1 tablet by mouth every evening    calcium carbonate 1500 (600 Ca) MG TABS tablet Take 1 tablet by mouth 2 times daily    Cholecalciferol 50 MCG (2000 UT) TABS Take 1 tablet by mouth daily    glucosamine-chondroitin 500-400 MG CAPS Take 1 capsule by mouth daily    ibuprofen (ADVIL;MOTRIN) 600 MG tablet Take 1 tablet by mouth every 6 hours as needed     No current facility-administered medications for this visit.     Medications Discontinued During  This Encounter   Medication Reason    gabapentin (NEURONTIN) 100 MG capsule        ALLERGIES     Allergies   Allergen Reactions    Codeine Shortness Of Breath    Lisinopril Cough     ACTIVE MEDICAL PROBLEMS     Patient Active Problem List   Diagnosis    Encounter for hepatitis C screening test for low risk patient    Hypertension    Preventative health care    Mixed hyperlipidemia    Prediabetes    Left hip pain    Encounter for immunization    Encounter for subsequent annual wellness visit (AWV) in Medicare patient    Vitamin D deficiency    Right hip pain    Primary osteoarthritis of right hip    Primary localized osteoarthrosis    Low back pain radiating to right leg    Lumbar radiculopathy    At high risk for falls    History of lumbar  discectomy    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  Her husband does smoke cigars.  She does not use illicit drugs.  She does drink wine 1-2 glasses at night.    She enjoys her time with grand children, reading.    Wears a seat belt in a car.          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 Married to [employer].  4 children together:  - Barbara Elliott 1970   -Barbara Elliott 1972 Grand child- 1 boy  -Barbara Elliott 1975 Grand child -1 girl  -Barbara Elliott (Almyra Free) [phone] - 3 girls    She does currently smoke cigarettes - 1/2 a pack a week, not ever     FAMILY HISTORY     Family History   Problem Relation Age of Onset    Other Sister         prediabetic    Obesity Sister     Colon Cancer Sister 30        colostomy    Cancer Sister         colon    High Blood Pressure Brother     Mastectomy Maternal Grandmother 24        breast cancer    No Known Problems Maternal Grandfather     No Known Problems Paternal Grandmother     Diabetes Paternal Grandfather     Osteoarthritis Brother 60        2 knee replacement    High Blood Pressure Brother     No Known Problems Brother     Hypertension Mother     Diabetes Father     Uterine Cancer Neg Hx     Ovarian Cancer Neg Hx      VITALS     Vitals:    04/27/22 1134  BP: 132/72   Site: Left Upper Arm   Position: Sitting   Cuff Size: Medium Adult   Pulse: 67   SpO2: 97%   Weight: 150 lb (68 kg)   Height: 5' 1.5" (1.562 m)    - Body mass index is 27.88 kg/m.    MEDICARE ANNUAL WELLNESS VISIT HEALTH RISK ASSESSMENT & PLAN      Recommendations for Preventive Services Due: see orders and patient instructions/AVS.   Recommended screening schedule for the next 5-10 years is provided to the patient in written form: see Patient Instructions/AVS.   Patient's complete Health Risk Assessment and screening values have been reviewed and are found in Flowsheets. The following problems were reviewed today and where indicated follow up appointments were made and/or referrals ordered.     Positive Risk Factor Screenings with Interventions:  Fall Risk:  Do you feel unsteady or are you worried about falling? : (!) yes  2 or more falls in past year?: no  Fall with injury in past year?: (!) yes   Interventions:    Completed PT, plan for osteopathic medicine.      Tobacco Use:  Tobacco Use: High Risk    Smoking Tobacco Use: Some Days    Smokeless Tobacco Use: Never    Passive Exposure: Not on file     E-cigarette/Vaping       Questions Responses    E-cigarette/Vaping Use     Start Date     Passive Exposure     Quit Date     Counseling Given     Comments         Interventions:  Patient declined any further intervention or treatment    Tobacco Use Counseling: Patient was counseled on tobacco cessation. Based upon patient's motivation to change her behavior, the following plan was agreed upon:  minimal tobacco use . Educational materials regarding tobacco cessation were provided. Provider spent <10 minutes discussing patient.            General Health and ACP:  General  What matters most to you?: Loss of mobility.  In general, how would you say your health is?: Fair  In the past 7 days, have you experienced any of the following: New or Increased Pain, New or Increased Fatigue, Loneliness, Social Isolation, Stress or Anger?: (!) Yes  Select all that apply: (!) New or Increased Pain (hip and knee)  Do you get the social and emotional support that you need?: Yes  Do you have a Living Will?: (!) No (pending completion)    Advance Directives       Power of  Attorney Living Will ACP-Advance Directive ACP-Power of Attorney    Not on File Not on File Not on File Not on File        General Health Risk Interventions:  No Living Will: Advance Care Planning addressed with patient today    Weight and Activity:  Physical Activity: Inactive    Days of Exercise per Week: 0 days    Minutes of Exercise per Session: 0 min     On average, how many days per week do you engage in moderate to strenuous exercise (like a brisk walk)?: 0 days  Have you lost any weight without trying in the past 3 months?: No  Body mass index is 27.88 kg/m.    Health Habits/Nutrition Interventions:  BMI looks good. Continue with exercise as tolerated    Vision Screen:  Do you have difficulty driving, watching TV, or  doing any of your daily activities because of your eyesight?: No  Have you had an eye exam within the past year?: (!) No  No results found.  Hearing/Vision Interventions:  Vision concerns:  Patient comments: she needs to schedule    Safety:  Do you have any tripping hazards - loose or unsecured carpets or rugs?: (!) Yes  Interventions:  Monitor and will remove as necessary.      Sexual Health: She  reports that she is not currently sexually active and has had partner(s) who are female. She reports using the following method of birth control/protection: Surgical.         CareTeam (Including outside providers/suppliers regularly involved in providing care):   Patient Care Team:  Kayson Tasker [facility], APRN - NP as PCP - General    ASSESSMENT AND PLAN     1. Encounter for subsequent annual wellness visit (AWV) in Medicare patient  2. Preventative health care  Assessment & Plan:  Addressed all of her care gaps today, including USPSTF recommended screenings and ACIP recommended immunizations.  pneumo vac sent to pharmacy     Orders:  -     CBC with Auto Differential; Future  -     Comprehensive Metabolic Panel w/ Reflex to MG; Future  -     Thyroid Cascade Profile; Future  -     Vitamin D 25 Hydroxy;  Future  -     Lipid Panel; Future  3. Left hip pain  Assessment & Plan:  Discussed PT  Pool therapy  Exercises given and addressed  nsaid therapy use caution for stomach ulcers and kidneys.  Labs ordered   Orders:  -     External Referral To Osteopathic Medicine  -     gabapentin (NEURONTIN) 100 MG capsule; Take 1 capsule by mouth nightly for 60 days. Intended supply: 90 days, Disp-60 capsule, R-1Normal  4. Right hip pain  Assessment & Plan:  Discussed therapy, pool therapy, stretches, exercise,  Discussed gabapentin for pain  Discussed osteopathic medicine  Continue with orthopedic/physiatry as needed.       Orders:  -     External Referral To Osteopathic Medicine  5. Low back pain radiating to right leg  Assessment & Plan:  Chronic recurrent  Discussed osteopathic medicine  Discussed pool therapy/stretching/exercises  Gabapentin to help with pain  nsaid therapy to help with pain  Monitor for improvement   Orders:  -     External Referral To Osteopathic Medicine  -     gabapentin (NEURONTIN) 100 MG capsule; Take 1 capsule by mouth nightly for 60 days. Intended supply: 90 days, Disp-60 capsule, R-1Normal  6. Prediabetes  Assessment & Plan:  We discussed that pre-diabetes is a precursor to Diabetes and it's very important to maintain a healthy weight in order to prevent it's progression.  I recommended that she continue to work on eating a diet low in concentrated sweets, avoid soda, and encouraged at least 150 minutes per week of regular aerobic exercise.  A1c was reviewed with the patient and there is no need for medication at this time.  Repeat Glycohemoglobin and follow-up in 12 months.     7. Mixed hyperlipidemia  -     Lipid Panel; Future  8. Vitamin D deficiency  Assessment & Plan:  Reviewed vitamin-D level with her which is low  Plan and medication management:  pending new lab     Orders:  -     Vitamin D 25 Hydroxy;  Future  -     DEXA BONE DENSITY AXIAL SKELETON; Future  9. Post-menopausal  -     Vitamin D  25 Hydroxy; Future  -     DEXA BONE DENSITY AXIAL SKELETON; Future      Follow up:  Return in about 8 weeks (around 06/22/2022) for follow up on gabapentin.     Future Appointments   Date Time Provider Department Center   06/29/2022  9:30 AM Tramayne Sebesta Baldwin CrownElizabeth Romen Yutzy, APRN - NP [facility]       Bekim Werntz [facility] CrownElizabeth Marcellino Fidalgo, APRN - NP  04/27/2022

## 2022-04-27 NOTE — Assessment & Plan Note (Signed)
Reviewed vitamin-D level with her which is low  Plan and medication management:  pending new lab

## 2022-04-27 NOTE — Assessment & Plan Note (Signed)
   We discussed that pre-diabetes is a precursor to Diabetes and it's very important to maintain a healthy weight in order to prevent it's progression.   I recommended that she continue to work on eating a diet low in concentrated sweets, avoid soda, and encouraged at least 150 minutes per week of regular aerobic exercise.   A1c was reviewed with the patient and there is no need for medication at this time.   Repeat Glycohemoglobin and follow-up in 12 months.

## 2022-04-27 NOTE — Assessment & Plan Note (Signed)
Discussed therapy, pool therapy, stretches, exercise,  Discussed gabapentin for pain  Discussed osteopathic medicine  Continue with orthopedic/physiatry as needed.

## 2022-04-27 NOTE — Assessment & Plan Note (Signed)
Addressed all of her care gaps today, including USPSTF recommended screenings and ACIP recommended immunizations.  pneumo vac sent to pharmacy

## 2022-04-27 NOTE — Assessment & Plan Note (Signed)
Discussed PT  Pool therapy  Exercises given and addressed  nsaid therapy use caution for stomach ulcers and kidneys.  Labs ordered

## 2022-04-28 ENCOUNTER — Encounter

## 2022-04-28 NOTE — Telephone Encounter (Signed)
Pt name and DOB verified.    Pt calling in requesting to schedule her labs that were ordered on 04/27/22. Pt states she will send a [facility] message to schedule the appointment.    [phone] (home)

## 2022-04-29 NOTE — Telephone Encounter (Signed)
[facility] message sent to schedule

## 2022-04-30 ENCOUNTER — Inpatient Hospital Stay: Admit: 2022-04-30 | Payer: MEDICARE | Primary: Family

## 2022-04-30 ENCOUNTER — Ambulatory Visit: Admit: 2022-04-30 | Discharge: 2022-04-30 | Payer: MEDICARE | Primary: Family

## 2022-04-30 DIAGNOSIS — R7303 Prediabetes: Secondary | ICD-10-CM

## 2022-04-30 DIAGNOSIS — Z0000 Encounter for general adult medical examination without abnormal findings: Secondary | ICD-10-CM

## 2022-04-30 LAB — CBC WITH AUTO DIFFERENTIAL
Absolute Eos #: 0.3 10*3/uL (ref 0.0–0.4)
Absolute Immature Granulocyte: 0 10*3/uL (ref 0.00–0.04)
Absolute Lymph #: 2 10*3/uL (ref 1.2–3.7)
Absolute Mono #: 0.8 10*3/uL (ref 0.2–1.0)
Basophils Absolute: 0 10*3/uL (ref 0.0–0.1)
Basophils: 1 % (ref 0–2)
Eosinophils %: 4 % (ref 0–5)
Hematocrit: 42.5 % (ref 37.0–47.0)
Hemoglobin: 14.3 g/dL (ref 12.0–16.0)
Immature Granulocytes: 0 % (ref 0.0–0.6)
Lymphocytes: 26 % (ref 14–46)
MCH: 32.6 PG — ABNORMAL HIGH (ref 27.0–31.0)
MCHC: 33.6 g/dL (ref 33.0–37.0)
MCV: 96.8 FL — ABNORMAL HIGH (ref 80.0–94.0)
MPV: 10.5 FL — ABNORMAL HIGH (ref 7.4–10.4)
Monocytes: 11 % (ref 5–12)
Nucleated RBCs: 0 PER 100 WBC
Platelets: 325 10*3/uL (ref 130–400)
RBC: 4.39 M/uL (ref 4.20–5.40)
RDW: 11.9 % (ref 11.5–14.5)
Seg Neutrophils: 58 % (ref 47–80)
Segs Absolute: 4.5 10*3/uL (ref 1.6–6.1)
WBC: 7.6 10*3/uL (ref 4.5–10.9)
nRBC: 0 10*3/uL

## 2022-04-30 LAB — COMPREHENSIVE METABOLIC PANEL W/ REFLEX TO MG FOR LOW K
ALT: 40 U/L (ref 12–78)
AST: 26 U/L (ref 10–37)
Albumin: 4.1 g/dL (ref 3.4–5.0)
Alk Phosphatase: 73 U/L (ref 43–117)
BUN: 18 MG/DL (ref 7–22)
CO2: 28 mmol/L (ref 21–32)
Calcium: 9.7 MG/DL (ref 8.5–10.1)
Chloride: 104 mmol/L (ref 98–108)
Creatinine: 0.74 MG/DL (ref 0.55–1.10)
Est, Glom Filt Rate: >60 mL/min/{1.73_m2} (ref >60)
Glucose: 95 mg/dL (ref 74–106)
Potassium: 4.2 mmol/L (ref 3.4–5.1)
Sodium: 136 mmol/L (ref 136–145)
Total Bilirubin: 0.7 mg/dL (ref 0.00–1.00)
Total Protein: 7.8 g/dL (ref 6.4–8.2)

## 2022-04-30 LAB — LIPID PANEL
Chol/HDL Ratio: 2.9
Cholesterol: 193 MG/DL (ref 0–199)
HDL: 66 MG/DL (ref >50)
LDL Cholesterol: 80.2 MG/DL
Non-HDL Cholesterol: 127 mg/dL
Triglycerides: 234 MG/DL — ABNORMAL HIGH (ref <150)

## 2022-04-30 LAB — VITAMIN D 25 HYDROXY: Vit D, 25-Hydroxy: 40.5 ng/mL

## 2022-04-30 LAB — THYROID CASCADE PROFILE: TSH, 3RD GENERATION: 4.04 u[IU]/mL — ABNORMAL HIGH (ref 0.358–3.740)

## 2022-04-30 LAB — T4, FREE: T4 Free: 0.8 ng/dL (ref 0.76–1.46)

## 2022-04-30 NOTE — Addendum Note (Signed)
Addended by: Chesley Noon on: 04/30/2022 09:04 AM     Modules accepted: Orders

## 2022-04-30 NOTE — Addendum Note (Signed)
Addended by: Chesley Noon on: 04/30/2022 09:05 AM     Modules accepted: Orders

## 2022-04-30 NOTE — Progress Notes (Signed)
Lab(s) Drawn:   1 green,1 gold, 1 lav  Drawn at [facility]  Needle used:  [x]  21 Ga  []  23 Ga  []  25 Ga  Sample obtained from:     []  Right arm    [x]  Left arm  []  Right hand  []  Left hand     Performed by: , MA  Pt tolerated well and left with no questions or concerns

## 2022-05-01 LAB — HEMOGLOBIN A1C
Hemoglobin A1C: 5.4 % (ref <5.7)
eAG: 108 mg/dL

## 2022-05-22 MED ORDER — AMLODIPINE BESYLATE 10 MG PO TABS
10 MG | ORAL_TABLET | ORAL | 3 refills | Status: AC
Start: 2022-05-22 — End: 2023-05-25

## 2022-05-22 NOTE — Telephone Encounter (Signed)
Calcium-Channel Blockers Protocol Passed 05/22/2022 01:17 AM   Protocol Details  Last Pulse reading greater than 50 recorded within past year    Visit with authorizing provider in past 9 months or upcoming 90 days      Rx sent per protocol

## 2022-05-28 ENCOUNTER — Encounter

## 2022-05-28 MED ORDER — LEVOTHYROXINE SODIUM 25 MCG PO TABS
25 MCG | ORAL_TABLET | Freq: Every day | ORAL | 1 refills | Status: DC
Start: 2022-05-28 — End: 2022-11-26

## 2022-06-03 ENCOUNTER — Ambulatory Visit: Payer: MEDICARE | Primary: Family

## 2022-06-03 DIAGNOSIS — Z780 Asymptomatic menopausal state: Secondary | ICD-10-CM

## 2022-06-08 ENCOUNTER — Encounter

## 2022-06-08 NOTE — Telephone Encounter (Signed)
From: Quintella Baton  To: Amy Jonna Coup  Sent: 06/08/2022 11:04 AM EDT  Subject: osteopath referral    can you do a referral to Dr Joaquim Lai. Dr Antionette Fairy earliest availability is September and Dr Joaquim Lai is just 2 weeks out  Thank you, Quintella Baton

## 2022-06-09 ENCOUNTER — Encounter

## 2022-06-10 ENCOUNTER — Ambulatory Visit: Admit: 2022-06-10 | Discharge: 2022-06-10 | Payer: MEDICARE | Attending: Internal Medicine | Primary: Family

## 2022-06-10 DIAGNOSIS — M25551 Pain in right hip: Secondary | ICD-10-CM

## 2022-06-10 MED ORDER — METHYLPREDNISOLONE ACETATE 40 MG/ML IJ SUSP
40 MG/ML | Freq: Once | INTRAMUSCULAR | Status: AC
Start: 2022-06-10 — End: 2022-10-15

## 2022-06-10 NOTE — Progress Notes (Signed)
[facility]  [address]  [place] [address]  Dept: [phone]      Ultrasound Report:  Exam Date/Time: 06/10/2022 1:56 PM   Patient Name: Barbara Elliott   Patient DOB: [birth year]   Patient MRN: [id number]   Equipment / Probe: Butterfly iQ+     Exam:   CPT Code:   20611 Arthrocentesis Major joint (knee, hip, shoulder)}    Indication:  M16.1  Hip OA    Hip Pain  M25.552    Protocol used:   Kissin, E. USSONAR Protocols. [place]. 2017   Talbert Cage. Musculoskeletal Ultrasound in Rheumatology Review. Ambulance person [place]; 2016    Procedure Note  Therapeutic Arthrocentesis with Ultrasound Guidance to the LEFT Hip Joint for symptomatic Osteoarthritis which failed conservative measures and impacts activities of daily life.       Clinical History: Medical, surgical, social, and family history were reviewed. They are unchanged.     Pre-procedure diagnosis: Primary Osteoarthritis of the Left Hip Joint    Post-procedure diagnosis: Same    Site Injected: LEFT HIP JOINT    Side of approach: Anterior Longitudinal, In-plane needle path, from inferior approach    Needle utilized: 3.5" 22g spinal needle    Medication utilized: 40 mg of depomedrol and 46mL of 1% lidocaine.       LEFT Hip. Anterior Lontigudinal. In-plane needle path. Tip within joint capsule.       LEFT Hip. Anterior Lontigudinal. In-plane needle path. Post injection.     Procedure: After reviewing indications, potential treatment benefit, treatment  alternatives, and discussing potential risks - including but not limited to  increased pain, infection, headache, bleeding, and the remote possibility of  catastrophic neurologic injury or death - the patient elected to proceed with  today's injection. A copy of the procedure consent form was offered to the  patient. Time-out was taken to confirm injection details and site was marked.    Current medications and allergies were reviewed.    Patient was laying on the exam table.  Proximal anterior left lower extremity was exposed, prepared with chlorhexedine and vapo-coolant spray. Under sterile technique, needle tip was advanced to within the joint capsule using ultrasound guidance. Of note, due to the presence of a bulky osteophyte, a capsular location more superior to the prior target was selected. Introduction stylette was removed and medication containing syringe was secured. The medication was delivered smoothly. Needle removed and bandage applied. Key images are as above.     Complications: There were no noted complications    Discharge disposition: Ambulatory discharge to residence    Comments: Uneventful injection preformed - the patient tolerated the procedure  well.    Impression: Successful ultrasound guided injection.    Plan: Will follow up to assess the clinical impact of this procedure.              Henri Medal [facility], DO  [facility]  06/10/2022   1:56 PM

## 2022-06-24 ENCOUNTER — Inpatient Hospital Stay: Admit: 2022-06-24 | Payer: MEDICARE | Primary: Family

## 2022-06-24 DIAGNOSIS — E038 Other specified hypothyroidism: Secondary | ICD-10-CM

## 2022-06-24 LAB — THYROID CASCADE PROFILE: TSH, 3RD GENERATION: 1.45 u[IU]/mL (ref 0.358–3.740)

## 2022-06-29 ENCOUNTER — Ambulatory Visit: Admit: 2022-06-29 | Discharge: 2022-06-29 | Payer: MEDICARE | Attending: Family | Primary: Family

## 2022-06-29 DIAGNOSIS — Z0000 Encounter for general adult medical examination without abnormal findings: Secondary | ICD-10-CM

## 2022-06-29 DIAGNOSIS — M25552 Pain in left hip: Secondary | ICD-10-CM

## 2022-06-29 NOTE — Assessment & Plan Note (Signed)
   Recommendations: eating a healthy diet, low-sodium intake (<1.5gm/day), and regular aerobic exercise (150 minutes or more per week).   Encouraged home blood pressure monitoring, and follow up if systolic blood pressure is over 140 or diastolic pressure is over 90 consistently.   Plan and medication management: Blood pressure is elevated - initially- recheck improved

## 2022-06-29 NOTE — Assessment & Plan Note (Signed)
Started 25 mcg levothyroxine.  Improved TSH  Improves symptoms of joint pain and fatigue  Continue current 25 mcg levothyroxine daily

## 2022-06-29 NOTE — Progress Notes (Signed)
[facility]   [address]  [place] [address]    CHIEF COMPLAINT   Barbara Elliott is a 71 y.o. female who presents today for follow-up of Follow-up    HISTORY OF PRESENT ILLNESS     Patient is here to follow up on her generalized problems.  She states she recently had a hip injection- she has had it better than previously.  Dr. Roberts Gaudy is going to call her on Wednesday for follow up.  There is concern for a bone spur in the joint- the injections can't quite get to where they need to.  She did try the gabapentin and found it made her foggy in her mind.  They have discussed nerve endings and she is not interested in it at all.   She did have her bone density - didn't feel it was osteoporosis severely affecting the joint for a hip replacement.  Continuing with the tylenol and ibuprofen.  She states the hip pain keeps her from doing things that are okay. She feels it is going to let go at some point.  It is affecting her quality of life, mobility, quality of life.  She said uneven ground is difficulty. She does stretch every day. She said the right leg was affected by the sciatica. The left hip and quad pull is causing her a lot of pain.  She continues with those exercises that PT had given her.    Thyroid Hx:   She continues on thyroid supplementation without any side effects.    Fatigue:   doesn't feel tired   Bowel changes:  no   Skin changes:  no     TSH, 3RD GENERATION (uIU/mL)   Date Value   06/24/2022 1.450   04/30/2022 4.040 (H)   03/25/2021 3.970 (H)     T4, Free (ng/dL)   Date Value   01/74/9449 0.8     T4 Free (ng/dL)   Date Value   67/59/1638 0.8        Reviewed on today's visit patient's past medical/surgical history, family/social history, medications allergies, previous lab work and imaging studies immunizations and preventive care      PHYSICAL EXAM   Vitals signs reviewed.   Constitutional:       General: Patient is not in any acute distress. Cane present in exam      Appearance: Patient is well-developed and well nourished.  HENT:      Head: Normocephalic.   Cardiovascular:      Rate and Rhythm: Normal rate and regular rhythm.      Heart sounds: Normal heart sounds. No murmur. No friction rub.      No edema present to lower extremities.  Pulmonary:      Effort: Pulmonary effort is normal. No respiratory distress      Breath sounds: Normal breath sounds throughout. No wheezing or rales.   Skin:     Comments:intact  Neurological:      Mental Status: Patient is oriented to person, place, and time.      Cranial Nerves: No cranial nerve deficit  Psychiatric:         Mood and Affect:    Mood normal     Behavior: Behavior normal.         Thought Content: Thought content normal.         Judgment: Judgment normal.       MEDICATIONS     Current Outpatient Medications   Medication Sig  levothyroxine (SYNTHROID) 25 MCG tablet Take 1 tablet by mouth daily    amLODIPine (NORVASC) 10 MG tablet TAKE 1 TABLET BY MOUTH EVERY DAY    metoprolol succinate (TOPROL XL) 25 MG extended release tablet TAKE 1 TABLET BY MOUTH EVERY DAY FOR BLOOD PRESSURE    Multiple Vitamin (MULTIVITAMIN ADULT PO) Take 1 tablet by mouth daily    atorvastatin (LIPITOR) 40 MG tablet Take 1 tablet by mouth daily (Patient taking differently: Take 1 tablet by mouth every evening)    acetaminophen (TYLENOL) 325 MG tablet Take 2 tablets by mouth every 4 hours as needed    calcium carbonate 1500 (600 Ca) MG TABS tablet Take 1 tablet by mouth 2 times daily    Cholecalciferol 50 MCG (2000 UT) TABS Take 1 tablet by mouth daily    glucosamine-chondroitin 500-400 MG CAPS Take 1 capsule by mouth daily    ibuprofen (ADVIL;MOTRIN) 600 MG tablet Take 1 tablet by mouth every 6 hours as needed     Current Facility-Administered Medications   Medication Dose Route Frequency    methylPREDNISolone acetate (DEPO-MEDROL) injection 40 mg  40 mg Intra-artICUlar Once     There are no discontinued medications.    ALLERGIES     Allergies   Allergen  Reactions    Codeine Shortness Of Breath    Lisinopril Cough     ACTIVE MEDICAL PROBLEMS     Patient Active Problem List   Diagnosis    Encounter for hepatitis C screening test for low risk patient    Hypertension    Preventative health care    Mixed hyperlipidemia    Prediabetes    Left hip pain    Encounter for immunization    Encounter for subsequent annual wellness visit (AWV) in Medicare patient    Vitamin D deficiency    Right hip pain    Primary osteoarthritis of right hip    Primary localized osteoarthrosis    Low back pain radiating to right leg    Lumbar radiculopathy    At high risk for falls    History of lumbar discectomy    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  Her husband does smoke cigars.  She does not use illicit drugs.  She does drink wine 1-2 glasses at night.    She enjoys her time with grand children, reading.    Wears a seat belt in a car.          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 Married to [employer].  4 children together:  - Sean 1970   -Adam 1972 Grand child- 1 boy  -Lyla Son 1975 Grand child -1 girl  -[name] [phone] - 3 girls    She does currently smoke cigarettes - 1/2 a pack a week, not ever     VITALS     Vitals:    06/29/22 0931   BP: 138/82   Site: Right Upper Arm   Position: Sitting   Cuff Size: Medium Adult   Pulse: 86   SpO2: 96%   Weight: 149 lb 6.4 oz (67.8 kg)   Height: 5' 1.5" (1.562 m)    - Body mass index is 27.77 kg/m.    ASSESSMENT AND PLAN     1. Left hip pain  Assessment & Plan:  Seen by orthopedic,  Seen by RA provider -injections completed  Discussed rhizotomy- declines  Completed PT x 2.  Exercises at home, heat and ice and stretches.  Discussed seeing orthopedics again for further evaluation.   2. Primary hypertension  Assessment & Plan:  Recommendations: eating a healthy diet, low-sodium intake (<1.5gm/day), and regular aerobic  exercise (150 minutes or more per week).  Encouraged home blood pressure monitoring, and follow up if systolic blood pressure is over 140 or diastolic pressure is over 90 consistently.  Plan and medication management: Blood pressure is elevated - initially- recheck improved     3. Subclinical hypothyroidism  Assessment & Plan:  Started 25 mcg levothyroxine.  Improved TSH  Improves symptoms of joint pain and fatigue  Continue current 25 mcg levothyroxine daily   4. Preventative health care  -     CBC with Auto Differential; Future  -     Thyroid Cascade Profile; Future  5. Mixed hyperlipidemia  -     Lipid Panel; Future  6. Prediabetes  -     Comprehensive Metabolic Panel w/ Reflex to MG; Future  -     Hemoglobin A1C; Future  7. Vitamin D deficiency  -     Vitamin D 25 Hydroxy; Future      Follow up:  Return in about 10 months (around 04/30/2023) for awv.     Future Appointments   Date Time Provider Department Center   07/01/2022  9:30 AM Grant Ruts, DO [facility] AMB   04/30/2023 10:00 AM Marili Vader [facility], APRN - NP [facility] AM[facility]       Sabina Beavers [facility], APRN - NP  06/29/2022

## 2022-06-29 NOTE — Assessment & Plan Note (Signed)
Seen by orthopedic,  Seen by RA provider -injections completed  Discussed rhizotomy- declines  Completed PT x 2.  Exercises at home, heat and ice and stretches.  Discussed seeing orthopedics again for further evaluation.

## 2022-07-01 ENCOUNTER — Telehealth: Admit: 2022-07-01 | Discharge: 2022-07-01 | Payer: MEDICARE | Attending: Internal Medicine | Primary: Family

## 2022-07-01 DIAGNOSIS — M25551 Pain in right hip: Secondary | ICD-10-CM

## 2022-07-01 NOTE — Progress Notes (Signed)
I was in the office and the patient was at home while conducting this encounter.    Consent:  She and/or her healthcare decision maker is aware that this patient-initiated Telehealth encounter is a billable service, with coverage as determined by her insurance carrier. She is aware that she may receive a bill and has provided verbal consent to proceed: Yes    This virtual visit was conducted via [employer]. This was a Video Visit. I was in the clinic and the patient was at home.     Total Time: 22 minutes.        Rheumatology Vitual Visit Note     Patient Name: Barbara Elliott   Patient MRN: [id number]   Date of Service: 07/01/2022     Visit Type: Virtual Visit - Hip Injection 06/10/2022      SUBJECTIVE / OBJECTIVE:   Pain improved about 50%  Still having functional issues with limited exertional capacity, 0.25 miles might be the limit.   She is able to do stairs without pain but has worries about power at the left leg.   The left hip has not "given out" on her recently.   About two days after the injection she was able to go to grocery store and walk without issue.       Exam not able to be performed - this is a virtual visit        ASSESSMENT AND PLAN:   Barbara Elliott is a 71 y.o. female.     Primary localized osteoarthritis of the left hip.   Although she is having relief from the injections, she feels unsteady with ambulation.   Exertional capacity is improved and pain is reduced.   I think that it was a successful intervention.  We discussed coolief and viscosupplementation, she will discuss with orthopedics as she may consider replacement for definitive treatment if that is what they suggest since she is not satisfied overall with the results of therapeutic arthrocentesis.     Follow up as needed.        [facility], DO  07/01/2022       This document was prepared in part using voice transcription software. Errors may occur and if there are questions regarding the content of this document, please contact the  author.

## 2022-08-07 ENCOUNTER — Encounter

## 2022-08-17 ENCOUNTER — Inpatient Hospital Stay: Admit: 2022-08-17 | Discharge: 2022-08-17 | Payer: MEDICARE | Attending: Orthopaedic Surgery | Primary: Family

## 2022-08-17 ENCOUNTER — Ambulatory Visit: Admit: 2022-08-17 | Discharge: 2022-08-17 | Payer: MEDICARE | Attending: Orthopaedic Surgery | Primary: Family

## 2022-08-17 DIAGNOSIS — M1612 Unilateral primary osteoarthritis, left hip: Secondary | ICD-10-CM

## 2022-08-17 NOTE — Progress Notes (Signed)
CC: bilateral leg pain    HPI:   From 04/11/21 - "The patient is a pleasant 71 y.o. year-old F who presents today for evaluation of left hip pain.  The hip has been painful for years. She has no Hx of trauma or surgery of the hip.  The pain has become increasingly severe over the past 6 months. The pain started in groin and is now moving towards lateral aspect of the hip. The pain occasionally radiates down the thigh typically when she wakes up.  The patient describes the pain as dull and aching in nature.  The pain is constant, and occasionally there are severe exacerbations of the pain.  The pain is exacerbated by prolonged standing and sudden movements .  From a functional standpoint, the patient has difficulty navigating stairs reciprocally and walking on uneven surfaces . She reports a worsening in her uneven gait.  The patient has tried conservative measures including Tylenol and physical therapy. They noticed improvement with physical therapy. Despite these treatments for greater than 6 months, the pain persists. The pain has become severe enough to interfere with activities of daily living.     She denies any extended steroid use, hx of heavy drinking, exposure to radiation, deep sea diving or FMHx of sickle cell. She is an occasional smoker, when she drinks wine. She has some days without smoking."    From 06/23/21 - She returns today for follow-up after having completed her MRI of the left hip. She foot pain after breaking her foot while getting into bed 2 weeks ago. For a couple weeks prior she was experiencing burning radiating down the right leg. The pain was exacerbated after the injury to her foot.  She has not yet started physical therapy, as she was waiting clearance to begin full weight-bearing.    09/15/21 - She returns today for follow-up.  Since last visit she has experienced more neuropathic pain in bilateral extremities.  She has a previous history of lumbar decompression, twice.  She reports  that the pain that she is feeling is can to the pain before decompression.  It is a burning pain that radiates the buttocks down to foot.  Dull, aching, sharp, burning in nature, bilateral. She has undergone left hip injection on July 26th and a right hip injection on August 9th by Dr. Gershon Crane. This injection provided 1.5 weeks of pain relief in the left leg and no pain relief in the right leg.  The pain has become severe enough to interfere with both activities of daily living and general quality of life.    She has an EMG scheduled on October 27.    08/17/22 - she returns today follow-up of left hip pain.  Since our last visit, she has undergone repeat decompression right L5 through S1 with Dr. Rose Phi, in February of 2023.  This was very helpful in addressing her radiculopathy.  She has progressive left groin pain which is interfering with her life.  She recently had a right ankle fracture that had to be treated with a Cam boot, and with the altered mobility this is markedly worsened her left groin pain.  Last cortisone injection was done 7/5, and provided no significant pain    Examination:   The patient is awake, alert and appropriate.  The head is normocephalic.  The sclerae are white.  The skin has normal turgor.  The abdomen is non-protruded.  The chest expands normally with deep inspiration.       Focused  examination of the left hip reveals normal-appearing skin of the trochanter.  The trochanter is nontender.  The seated position rotational motion of the left hip is painful.  This is worse with internal rotation.  Hip flexion strength is mildly reduced secondary to pain.  Abduction strength is normal.  In the seated position, the hip flexes to 90 degrees abducts 30 degrees and adducts 10 degrees.  There is pain at the extremes of all motions.  There is a positive Stinchfield test.  There is a positive anterior impingement test.  Femoral and sciatic nerve function are intact.  The calf is soft and supple.       Imaging:  Updated radiographs were obtained today at [facility] in independently by myself.  There is an AP of pelvis with a frog-leg lateral of the left hip.  There are advanced degenerative changes with full thickness joint space narrowing superiorly.  There are subchondral cysts.  There are periarticular osteophytes.  There is progression of degeneration comparison to previous radiographs.    Assessment: 71 y.o. F with progressive left hip osteoarthritis, lumbar radiculopathy     Plan:   I have discussed the diagnosis with the patient.  We have discussed the natural history of hip joint arthrosis. We have discussed both non-surgical and surgical treatment options. Despite treatment with conservative measures, the patient reports ongoing symptoms that interfere with quality of life and desired activities. I have discussed with the patient the risks, benefits, expectations and alternatives of total hip arthroplasty. The following risks were discussed specifically: infection, dislocation, leg length inequality, neurovascular embarrassment, fracture, heterotopic bone formation, venothromboembolic phenomenon, post-operative stiffness or pain, implant loosening or failure, wear particle induced osteolysis, a multitude of cardiopulmonary complications, death, or other imponderables. All questions are answered, and surgical consents obtained freely. The patient is going to consider the options and contact our office if surgery is desired.    -Surgical Consent Obtained: Obtained in office - left THA   -Medical Clearance: PCP  -Booking Form: Completed  -VTE risk assessment: Standard risk  -Bleeding risk assessment:  Standard risk  -Post operative pharmacologic VTE prophylaxis plan: enteric coated aspirin 81mg  twice daily starting in the morning of post operative day 1 for a month  -Post operative non-pharmacologic VTE prophylaxis plan: 1) ambulation on the day of surgery 2) sequential compressive devices while in  bed  -Pre-op Pain management: Continue with current regimen; avoid narcotics  -Post-op Pain management: Cryotherapy for 15 minutes every 2 hours; NSAID x 4 weeks; Oxycodone (only to be used for breakthrough pain)  -Administer TXA: Yes  -Perioperative antibiotic: Cefazolin + postop oral antibiotics  -Disposition: Home   -Postop therapy: home PT followed by outpatient PT  -preoperative nicotine use increases the risk for complications; she will work on nicotine cessation in anticipation of surgery  -we will plan to do her surgery when she is greater 3 months from last injection, which was done 7/5    This chart was dictated using [employer], a voice to text software; inherent to using this technology, there may be incorrectly spelled words, or erroneous phrases. Please call the office with any questions or concerns.      A copy of this note will be sent to the referring provider.

## 2022-08-18 ENCOUNTER — Encounter

## 2022-08-18 NOTE — Progress Notes (Signed)
After being examined by Dr Namon Cirri, Patient is being scheduled for surgery at [facility] on November 7,2023 for LTHA.    Pre-op educational material provided, joint replacement booklet reviewed and given to patient. Patient signed up for PT class on November 1,2023.    Preliminary questions answered .  Pre-op questionnaire completed. Reviewed with patient the type of upcoming appointments required pre-op.  Instructed to call office if has any questions or concerns.  PCP clearances requested

## 2022-10-07 MED ORDER — METOPROLOL SUCCINATE ER 25 MG PO TB24
25 MG | ORAL_TABLET | ORAL | 3 refills | Status: DC
Start: 2022-10-07 — End: 2023-10-04

## 2022-10-07 MED ORDER — ATORVASTATIN CALCIUM 40 MG PO TABS
40 MG | ORAL_TABLET | Freq: Every day | ORAL | 3 refills | Status: DC
Start: 2022-10-07 — End: 2023-10-04

## 2022-10-07 NOTE — Telephone Encounter (Signed)
Requested Renewals     Name from pharmacy: ATORVASTATIN 40 MG TABLET         Will file in chart as: atorvastatin (LIPITOR) 40 MG tablet    Sig: TAKE 1 TABLET BY MOUTH EVERY DAY    Disp:  90 tablet    Refills:  3    Start: 10/07/2022    Class: Normal    Non-formulary    Last ordered: 1 year ago (10/06/2021) by Edmonia Lynch, APRN - NP    Last refill: 07/10/2022    Rx #: [id number]    Hmg CoA Reductase Inhibitors Protocol Passed 10/07/2022 01:36 AM   Protocol Details  Last Lipid panel resulted within the past 12 months    Visit with authorizing provider in past 9 months or upcoming 90 days    Last AST and ALT levels < or = to 3x normal within the past 3 years       Name from pharmacy: METOPROLOL SUCC ER 25 MG TAB         Will file in chart as: metoprolol succinate (TOPROL XL) 25 MG extended release tablet    Sig: TAKE 1 TABLET BY MOUTH EVERY DAY FOR BLOOD PRESSURE    Disp:  90 tablet    Refills:  1    Start: 10/07/2022    Class: Normal    Last ordered: 6 months ago (04/06/2022) by Edmonia Lynch, APRN - NP    Last refill: 07/11/2022    Rx #: [id number]    Beta-Blockers Protocol Passed 10/07/2022 01:36 AM   Protocol Details  Last Pulse reading greater than 50 recorded within past year    Visit with authorizing provider in past 9 months or upcoming 90 days             Per protocol, rx sent

## 2022-10-15 ENCOUNTER — Ambulatory Visit: Admit: 2022-10-15 | Discharge: 2022-10-15 | Payer: MEDICARE | Attending: Family | Primary: Family

## 2022-10-15 DIAGNOSIS — Z23 Encounter for immunization: Secondary | ICD-10-CM

## 2022-10-15 DIAGNOSIS — Z01818 Encounter for other preprocedural examination: Secondary | ICD-10-CM

## 2022-10-15 NOTE — Assessment & Plan Note (Signed)
With in range of normal.  Last two A1C less than 5.7  Will resolve and continue to monitor.

## 2022-10-15 NOTE — Assessment & Plan Note (Addendum)
ASC risk calculator completed- patient is below cardiovascular risk.  Emailed to patient- completed in office.  Discussed healthy eating, exercise, diet  Pneumonia vaccine given today.   Recommended flu vaccine.

## 2022-10-15 NOTE — Assessment & Plan Note (Signed)
Planned surgical intervention for 11/03/22  Discussed risk factors.  asc risk calculor completed and below average cardiovascular risk

## 2022-10-15 NOTE — Assessment & Plan Note (Signed)
Completed PT- uses a cane to ambulate  Discussed continued therapy after surgery  Discussed stairs that need to be climbed when home-   States once home- on second floor can stay on second floor-  6 stairs into house, full flight to second floor.  Discussed to further converse with orthopedic/PT for strengthening post operative.

## 2022-10-15 NOTE — Progress Notes (Signed)
[facility]   [address]  [place] [address]    CHIEF COMPLAINT   Barbara Elliott is a 71 y.o. female who presents to to the office for pre-operative evaluation.   HISTORY OF PRESENT ILLNESS   Patient is here for preoperative evaluation- she states the pain has increased-     Patient presents in consultation, as requested by her surgeon, for pre-operative risk assessment for upcoming surgery.    Surgeon:  Dr. Namon Cirri  PCP:  Ruthine Dose, APRN - NP  Date of Surgery:  11/03/2022  Pre-operative Diagnosis:  primary osteoarthritis of left hip  Procedure planned:  left total hip arthroplasty Direct anterior   Anesthesia:  Spinal    History of Anesthesia Complications: no  History of Abnormal Bleeding: no  History of Blood Transfusions: no  Recent use of Anticoagulant: No recent use of aspirin, NSAIDs, steroids or any anticoagulants    Reviewed on today's visit patient's past medical/surgical history, family/social history, medications allergies, previous lab work and imaging studies immunizations and preventive care    MEDICATIONS     Current Outpatient Medications   Medication Sig    atorvastatin (LIPITOR) 40 MG tablet TAKE 1 TABLET BY MOUTH EVERY DAY    levothyroxine (SYNTHROID) 25 MCG tablet Take 1 tablet by mouth daily    amLODIPine (NORVASC) 10 MG tablet TAKE 1 TABLET BY MOUTH EVERY DAY    Multiple Vitamin (MULTIVITAMIN ADULT PO) Take 1 tablet by mouth daily    acetaminophen (TYLENOL) 325 MG tablet Take 2 tablets by mouth every 4 hours as needed    calcium carbonate 1500 (600 Ca) MG TABS tablet Take 1 tablet by mouth 2 times daily    Cholecalciferol 50 MCG (2000 UT) TABS Take 1 tablet by mouth daily    glucosamine-chondroitin 500-400 MG CAPS Take 1 capsule by mouth daily    ibuprofen (ADVIL;MOTRIN) 600 MG tablet Take 1 tablet by mouth every 6 hours as needed    metoprolol succinate (TOPROL XL) 25 MG extended release tablet TAKE 1 TABLET BY MOUTH EVERY DAY FOR BLOOD PRESSURE      No current facility-administered medications for this visit.     Medications Discontinued During This Encounter   Medication Reason    methylPREDNISolone acetate (DEPO-MEDROL) injection 40 mg        ALLERGIES     Allergies   Allergen Reactions    Codeine Shortness Of Breath    Lisinopril Cough       REVIEW OF SYSTEMS     Neurologic: Denies headache, dizziness, syncope or tremors.     Eyes:  No change in vision.   Ears:  No decreased hearing.  No tinnitus.   Nose:  No rhinorrhea or congestion   Oropharynx:  No sores, dysphagia, dental pain, or sore throat   Pulmonary:  Denies cough or shortness of breath.   Cardiac:  Denies chest pain, palpitations, or peripheral edema.   GI:  Denies abdominal pain, nausea, emesis, constipation, diarrhea   GU:  Denies dysuria, hematuria, weak stream, or nocturia.    Endocrine:  Denies polyuria, polydipsia, or weight change  Musculoskeletal:  Denies any new myalgias, joint pain, or joint swelling  Psychiatric:  Denies depression, anxiety, or excessive irritability    ACTIVE MEDICAL PROBLEMS     Patient Active Problem List   Diagnosis    Encounter for hepatitis C screening test for low risk patient    Hypertension    Preventative health care  Mixed hyperlipidemia    Left hip pain    Encounter for immunization    Encounter for subsequent annual wellness visit (AWV) in Medicare patient    Vitamin D deficiency    Lumbar radiculopathy    Preoperative clearance    At high risk for falls    History of lumbar discectomy    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    Post-menopausal    Subclinical hypothyroidism     PAST SURGICAL HISTORY     Past Surgical History:   Procedure Laterality Date    CHOLECYSTECTOMY  1980    HYSTERECTOMY (CERVIX STATUS UNKNOWN)  2012    HYSTERECTOMY, VAGINAL  2013    IR CHOLECYSTOSTOMY PERCUTANEOUS COMPLETE  1980    LUMBAR DISCECTOMY  1998    LUMBAR SPINE SURGERY Right 12/31/2021    LUMBAR LAMINECTOMY DISCECTOMY POSTERIOR L5-S1 performed by Marcille Buffy,  MD at [facility] MAIN OR    OTHER SURGICAL HISTORY      pelvic floor, mesh and raise bladder     SOCIAL HISTORY     Social History     Social History Narrative    y day.  Her husband does smoke cigars.  She does not use illicit drugs.  She does drink wine 1-2 glasses at night.    She enjoys her time with grand children, reading.    Wears a seat belt in a car.          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 Married to [employer].  4 children together:  - Barbara Elliott 1970   -Barbara Elliott 1972 Grand child- 1 boy  -Barbara Elliott 1975 Grand child -1 girl  -Barbara Elliott ([place]) [phone] - 3 girls    She does currently smoke cigarettes - 1/2 a pack a week, not ever     FAMILY HISTORY     Family History   Problem Relation Age of Onset    Other Sister         prediabetic    Obesity Sister     Colon Cancer Sister 30        colostomy    Cancer Sister         colon    High Blood Pressure Brother     Mastectomy Maternal Grandmother 51        breast cancer    No Known Problems Maternal Grandfather     No Known Problems Paternal Grandmother     Diabetes Paternal Grandfather     Osteoarthritis Brother 60        2 knee replacement    High Blood Pressure Brother     No Known Problems Brother     Hypertension Mother     Diabetes Father     Uterine Cancer Neg Hx     Ovarian Cancer Neg Hx      VITALS     Vitals:    10/15/22 1338 10/15/22 1424   BP: (!) 140/80 138/82   Site: Left Upper Arm Left Upper Arm   Position: Sitting Sitting   Cuff Size: Medium Adult Medium Adult   Pulse: 63    SpO2: 97%    Weight: 68.7 kg (151 lb 6.4 oz)    Height: 1.562 m (5' 1.5")     - Body mass index is 28.14 kg/m.    PHYSICAL EXAM     Eyes: PERRLA, non-injected  Ears: Tympanic membranes non-erythematous and in the neutral position, EAM without erythema or discharge  Nose: Patent  Oropharynx:  Oral mucosa moist without lesions, no pharyngeal injection, tonsillar hypertrophy or exudate  Neck: No adenopathy, thyromegaly, masses, or bruits  Heart: regular rate and rhythm without murmur  Lungs: clear to  auscultation  Abdomen: soft, nontender, no masses or organomegaly  Extremities: no edema, pulses are normal  Skin:  No rash or suspicious skin lesions  Musculoskeletal:  No joint swelling or tenderness  Neurologic:  CNII-XII intact. Normal strength, sensation and reflexes throughout.    LABS & IMAGING     No visits with results within 1 Month(s) from this visit.   Latest known visit with results is:   Hospital Outpatient Visit on 06/24/2022   Component Date Value Ref Range Status    TSH, 3RD GENERATION 06/24/2022 1.450  0.358 - 3.740 uIU/mL Final       IMMUNIZATIONS     Immunization History   Administered Date(s) Administered    COVID-19, MODERNA BLUE border, Primary or Immunocompromised, (age 12y+), IM, 100 mcg/0.62mL 05/13/2021    COVID-19, MODERNA Bivalent, (age 12y+), IM, 50 mcg/0.5 mL 11/27/2021    COVID-19, MODERNA, (2023-24 formula), (age 12y+), IM, 30mcg/0.5mL 09/30/2022    COVID-19, PFIZER PURPLE top, DILUTE for use, (age 74 y+), 79mcg/0.3mL 02/10/2020, 03/04/2020, 11/04/2020    Influenza, FLUZONE (age 32 y+), High Dose, 0.67mL 11/27/2021    Influenza, High Dose (Fluzone 65 yrs and older) 11/04/2020    Pneumococcal Conjugate Vaccine 06/10/2015    Pneumococcal, PCV20, PREVNAR 30, (age 6w+), IM, 0.63mL 10/15/2022    Pneumococcal, PPSV23, PNEUMOVAX 23, (age 2y+), SC/IM, 0.38mL 07/03/2016    TDaP, ADACEL (age 47y-64y), BOOSTRIX (age 10y+), IM, 0.74mL 12/01/2019    Zoster Recombinant (Shingrix) 10/22/2015       ASSESSMENT AND PLAN     1. Preoperative clearance  Assessment & Plan:  ASC risk calculator completed- patient is below cardiovascular risk.  Emailed to patient- completed in office.  Discussed healthy eating, exercise, diet  Pneumonia vaccine given today.   Recommended flu vaccine.     2. At high risk for falls  Assessment & Plan:  Completed PT- uses a cane to ambulate  Discussed continued therapy after surgery  Discussed stairs that need to be climbed when home-   States once home- on second floor can stay on  second floor-  6 stairs into house, full flight to second floor.  Discussed to further converse with orthopedic/PT for strengthening post operative.   3. Left hip pain  Assessment & Plan:  Planned surgical intervention for 11/03/22  Discussed risk factors.  asc risk calculor completed and below average cardiovascular risk     4. Prediabetes  Assessment & Plan:  With in range of normal.  Last two A1C less than 5.7  Will resolve and continue to monitor.     5. Encounter for immunization  -     Pneumococcal, PCV20, PREVNAR 68, (age 6w+), IM, PF      Pre-Operative Evaluation:  ACS RISK CALCULATOR  Cardiopulmonary risk assessment was performed today and patient is at [place] risk for upcoming surgery.  Patient is medically optimized and cleared for surgery.  Anticoagulation considerations:  none  Nutritional Status is normal (albumin level > 3.4 mg per dL).  Albumin   Date Value Ref Range Status   04/30/2022 4.1 3.4 - 5.0 g/dL Final     There are no contraindications to anesthesia.  Copy of this report to be sent to the requesting surgeon.    Follow up:  Return in about 3 months (around 01/15/2023)  for follow up post operatively.     Future Appointments   Date Time Provider Department Center   10/20/2022  7:45 AM [facility] PAT [facility] [facility]   10/21/2022  1:00 PM Balinda Quails, [place] [facility]THO [facility] AMB   11/25/2022  1:00 PM Drown, Melissa A, PT [facility]   11/25/2022  2:00 PM ORTHO NURSE 4 [facility] AMB   12/16/2022  8:15 AM [facility] [place] [facility]   12/16/2022  8:30 AM Irena Reichmann, MD ORTHO [facility] AMB   01/18/2023  1:30 PM Ruthine Dose, APRN - NP [facility] AMB   02/03/2023 12:30 PM Ranell Patrick, APRN - NP ORTHO [facility] AMB   04/30/2023 10:00 AM Ruthine Dose, APRN - NP [facility] AMB       Zenaida Tesar [place], APRN - NP  10/15/2022

## 2022-10-20 ENCOUNTER — Inpatient Hospital Stay: Admit: 2022-10-20 | Payer: MEDICARE | Primary: Family

## 2022-10-21 ENCOUNTER — Inpatient Hospital Stay: Admit: 2022-10-21 | Payer: MEDICARE | Primary: Family

## 2022-10-21 ENCOUNTER — Ambulatory Visit: Admit: 2022-10-21 | Discharge: 2022-10-21 | Payer: MEDICARE | Attending: Physician Assistant | Primary: Family

## 2022-10-21 DIAGNOSIS — Z01818 Encounter for other preprocedural examination: Secondary | ICD-10-CM

## 2022-10-21 DIAGNOSIS — M1612 Unilateral primary osteoarthritis, left hip: Secondary | ICD-10-CM

## 2022-10-21 LAB — CBC WITH AUTO DIFFERENTIAL
Absolute Eos #: 0.2 10*3/uL (ref 0.0–0.4)
Absolute Immature Granulocyte: 0 10*3/uL (ref 0.00–0.04)
Absolute Lymph #: 2.3 10*3/uL (ref 1.2–3.7)
Absolute Mono #: 0.9 10*3/uL (ref 0.2–1.0)
Basophils Absolute: 0 10*3/uL (ref 0.0–0.1)
Basophils: 0 % (ref 0–2)
Eosinophils %: 1 % (ref 0–5)
Hematocrit: 42.2 % (ref 37.0–47.0)
Hemoglobin: 14.2 g/dL (ref 12.0–16.0)
Immature Granulocytes: 0 % (ref 0.0–0.6)
Lymphocytes: 20 % (ref 14–46)
MCH: 32.2 PG — ABNORMAL HIGH (ref 27.0–31.0)
MCHC: 33.6 g/dL (ref 33.0–37.0)
MCV: 95.7 FL — ABNORMAL HIGH (ref 80.0–94.0)
MPV: 10.3 FL (ref 7.4–10.4)
Monocytes: 8 % (ref 5–12)
Nucleated RBCs: 0 PER 100 WBC
Platelets: 354 10*3/uL (ref 130–400)
RBC: 4.41 M/uL (ref 4.20–5.40)
RDW: 12 % (ref 11.5–14.5)
Seg Neutrophils: 71 % (ref 47–80)
Segs Absolute: 8 10*3/uL — ABNORMAL HIGH (ref 1.6–6.1)
WBC: 11.4 10*3/uL — ABNORMAL HIGH (ref 4.5–10.9)
nRBC: 0 10*3/uL

## 2022-10-21 LAB — TYPE AND SCREEN
ABO/Rh: A POS
Antibody Screen: NEGATIVE

## 2022-10-21 LAB — URINALYSIS WITH REFLEX TO CULTURE
Bilirubin Urine: NEGATIVE
Blood, Urine: NEGATIVE
Glucose, Ur: NORMAL mg/dL
Ketones, Urine: NEGATIVE mg/dL
Leukocyte Esterase, Urine: 250 uL — AB
Protein, Urine: NEGATIVE mg/dL
Specific Gravity, UA: 1.015 (ref 1.008–1.030)
Urobilinogen, Urine: NORMAL EU/dL
pH, Urine: 5 (ref 5.0–8.0)

## 2022-10-21 LAB — COMPREHENSIVE METABOLIC PANEL
ALT: 35 U/L (ref 12–78)
AST: 24 U/L (ref 10–37)
Albumin: 4.2 g/dL (ref 3.4–5.0)
Alk Phosphatase: 84 U/L (ref 43–117)
BUN: 17 MG/DL (ref 7–22)
CO2: 29 mmol/L (ref 21–32)
Calcium: 9.7 MG/DL (ref 8.5–10.1)
Chloride: 99 mmol/L (ref 98–108)
Creatinine: 0.91 MG/DL (ref 0.55–1.10)
Est, Glom Filt Rate: >60 mL/min/{1.73_m2} (ref >60)
Glucose: 90 mg/dL (ref 74–106)
Potassium: 4 mmol/L (ref 3.4–5.1)
Sodium: 134 mmol/L — ABNORMAL LOW (ref 136–145)
Total Bilirubin: 0.8 mg/dL (ref 0.00–1.00)
Total Protein: 8.1 g/dL (ref 6.4–8.2)

## 2022-10-21 LAB — MRSA BY PCR: MRSA by PCR: NEGATIVE

## 2022-10-21 NOTE — Progress Notes (Signed)
CC: Pre-op Appointment Total Hip    HPI:  The patient is a 71 y.o.-year-old female who presents today with a long standing history of left hip pain that has failed conservative treatment. Currently the pain is rated at 5/10. The patient presents today for pre-operative history and physical prior to left total hip replacement. The patient denies fevers, chills or night sweats. Denies a hx of MRSA.    Denies a personal hx of DVT or PE.    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/04/2021    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    surgery Dec 31 2021    Wears glasses        Current Outpatient Medications:     acetaminophen (TYLENOL) 500 MG tablet, Take 2 tablets by mouth every 6 hours as needed for Pain, Disp: , Rfl:     ibuprofen (ADVIL;MOTRIN) 200 MG tablet, Take 2 tablets by mouth every 6 hours as needed for Pain, Disp: , Rfl:     atorvastatin (LIPITOR) 40 MG tablet, TAKE 1 TABLET BY MOUTH EVERY DAY (Patient taking differently: Take 1 tablet by mouth every evening), Disp: 90 tablet, Rfl: 3    metoprolol succinate (TOPROL XL) 25 MG extended release tablet, TAKE 1 TABLET BY MOUTH EVERY DAY FOR BLOOD PRESSURE (Patient taking differently: Take 1 tablet by mouth daily), Disp: 90 tablet, Rfl: 3    levothyroxine (SYNTHROID) 25 MCG tablet, Take 1 tablet by mouth daily, Disp: 90 tablet, Rfl: 1    amLODIPine (NORVASC) 10 MG tablet, TAKE 1 TABLET BY MOUTH EVERY DAY (Patient  taking differently: Take 1 tablet by mouth every evening), Disp: 90 tablet, Rfl: 3    Multiple Vitamin (MULTIVITAMIN ADULT PO), Take 1 tablet by mouth daily, Disp: , Rfl:     calcium carbonate 1500 (600 Ca) MG TABS tablet, Take 1 tablet by mouth 2 times daily, Disp: , Rfl:     Cholecalciferol 50 MCG (2000 UT) TABS, Take 1 tablet by mouth daily, Disp: , Rfl:     glucosamine-chondroitin 500-400 MG CAPS, Take 1 capsule by mouth daily, Disp: , Rfl:   Allergies   Allergen Reactions    Codeine Shortness Of Breath    Lisinopril Cough     Past Surgical History:   Procedure Laterality Date    CHOLECYSTECTOMY  1980    HYSTERECTOMY, VAGINAL  2013    IR CHOLECYSTOSTOMY PERCUTANEOUS COMPLETE  1980    LUMBAR DISCECTOMY  1998    LUMBAR SPINE SURGERY Right 12/31/2021    Re-exploration Right L5-S1 with lateral recess decompression & diskectomy - Dr. Marcille Buffy    OTHER SURGICAL HISTORY  2013    pelvic floor, mesh and raise bladder with hysterectomy     Family History   Problem Relation Age  of Onset    Other Sister         prediabetic    Obesity Sister     Colon Cancer Sister 23        colostomy    Cancer Sister         colon    High Blood Pressure Brother     Mastectomy Maternal Grandmother 80        breast cancer    No Known Problems Maternal Grandfather     No Known Problems Paternal Grandmother     Diabetes Paternal Grandfather     Osteoarthritis Brother 67        2 knee replacement    High Blood Pressure Brother     No Known Problems Brother     Hypertension Mother     Diabetes Father     Uterine Cancer Neg Hx     Ovarian Cancer Neg Hx      Social History     Socioeconomic History    Marital status: Married     Spouse name: Not on file    Number of children: Not on file    Years of education: ADN    Highest education level: Not on file   Occupational History    Not on file   Tobacco Use    Smoking status: Former     Packs/day: 0.00     Years: 40.00     Additional pack years: 0.00     Total pack years: 0.00     Types: Cigarettes      Start date: 09/02/1979     Quit date: 09/26/2022     Years since quitting: 0.0    Smokeless tobacco: Never    Tobacco comments:     average 2 to 4 cigarettes daily   Vaping Use    Vaping Use: Never used   Substance and Sexual Activity    Alcohol use: Yes     Alcohol/week: 8.0 standard drinks of alcohol     Types: 8 Glasses of wine per week     Comment: "I drink wine daily"    Drug use: Never    Sexual activity: Yes     Partners: Male     Birth control/protection: Surgical     Comment: 50 years   Other Topics Concern    Not on file   Social History Narrative    y day.  Her husband does smoke cigars.  She does not use illicit drugs.  She does drink wine 1-2 glasses at night.    She enjoys her time with grand children, reading.    Wears a seat belt in a car.          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 Married to [employer].  4 children together:  - [place] child- 1 boy  -[place] child -1 girl  -Benjamine Mola ([place]) [phone] - 3 girls    She does currently smoke cigarettes - 1/2 a pack a week, not ever     Social Determinants of Health     Financial Resource Strain: Low Risk  (11/27/2021)    Overall Financial Resource Strain (CARDIA)     Difficulty of Paying Living Expenses: Not hard at all   Food Insecurity: No Food Insecurity (11/27/2021)    Hunger Vital Sign     Worried About Running Out of Food in the Last Year: Never true     Ran Out of  Food in the Last Year: Never true   Transportation Needs: Not on file   Physical Activity: Inactive (04/27/2022)    Exercise Vital Sign     Days of Exercise per Week: 0 days     Minutes of Exercise per Session: 0 min   Stress: Not on file   Social Connections: Not on file   Intimate Partner Violence: Not on file   Housing Stability: Not on file       ROS: 12- point ROS was reviewed as is negative except as above. She does admit to an occasional very brief flutter in her chest without any pain or dizziness. This happens rarely. She has never brought it up with her  PCP.    Examination: There were no vitals taken for this visit.  The patient is awake, alert and oriented times 3 and answering questions appropriately.   Breathing is unlabored  Heart has regular rate and rhythm   Abdomen is non-distended     Examination of bilateral lower extremities reveals neurovascularly intact limbs. Skin is warm, dry and intact.  There is no gross deformity of the left hip. Range of motion of the hip is limited in internal and external rotation. There is 5/5 muscle strength in EHL/FHL. Motor and sensation is intact throughout.     Imaging: Previous 2V left hip xray imaging reviewed by myself, demonstrates subchondral sclerosis, osteophytes and joint space narrowing indicative of severe osteoarthritis    Assessment: Left primary hip osteoarthritis     Plan: At this point the patient has failed conservative treatment for hip arthritis. We discussed the options, both operative and non-operative. We discussed the risks and benefits of surgery which include but are not limited to fracture, tendon rupture, gait disturbance, leg length discrepancy, dislocation, recurrent stiffness of the hip, infection, blood clots, nerve injury, vascular injury, periprosthetic fracture, implant failure, incomplete resolution of hip pain and the risks that come with anesthesia up to and including death. The patient understands the risks and has signed consent for left hip total arthroplasty on 11/03/22 with Dr Namon Cirri     The patient understands and agrees with the plan.    -Approach - Direct Anterior  -Medical Clearance: PAT  -Booking Form: Completed  -VTE risk assessment: Standard risk  -Bleeding risk assessment:  Standard risk  -Post operative pharmacologic VTE prophylaxis plan: Enteric coated aspirin 81mg  twice daily starting in the morning of post operative day 1 for 35 days  -Post operative non-pharmacologic VTE prophylaxis plan: 1) ambulation on the day of surgery 2) sequential compressive devices while in  bed  -Pre-op Pain management: Continue with current regimen; avoid narcotics  -Post-op Pain management: Cryotherapy for 15 minutes every 2 hours; Adductor canal local anesthetic; NSAID x 4 weeks; Oxycodone (only to be used for breakthrough pain)  -Administer TXA: Yes  -Perioperative Antibiotic: Ancef 2g and extended antibiotics with Cefadroxil 500mg  BID x7 days post-op    -Disposition: Same Day Surgery  -Postop therapy: home PT followed by outpatient PT  -Last intraarticular injection: 06/10/22  Patient reminded to be NPO after midnight.  They did receive a Gatorade with instructions as well as chlorhexidine wipes with instructions.  They will report any health status changes prior to the surgical date.  They will need a ride home from surgery  All questions were answered.  Patient wishes to proceed with the above-stated surgery.

## 2022-10-21 NOTE — H&P (View-Only) (Signed)
CC: Pre-op Appointment Total Hip    HPI:  The patient is a 71 y.o.-year-old female who presents today with a long standing history of left hip pain that has failed conservative treatment. Currently the pain is rated at 5/10. The patient presents today for pre-operative history and physical prior to left total hip replacement. The patient denies fevers, chills or night sweats. Denies a hx of MRSA.    Denies a personal hx of DVT or PE.    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/04/2021    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    surgery Dec 31 2021    Wears glasses        Current Outpatient Medications:     acetaminophen (TYLENOL) 500 MG tablet, Take 2 tablets by mouth every 6 hours as needed for Pain, Disp: , Rfl:     ibuprofen (ADVIL;MOTRIN) 200 MG tablet, Take 2 tablets by mouth every 6 hours as needed for Pain, Disp: , Rfl:     atorvastatin (LIPITOR) 40 MG tablet, TAKE 1 TABLET BY MOUTH EVERY DAY (Patient taking differently: Take 1 tablet by mouth every evening), Disp: 90 tablet, Rfl: 3    metoprolol succinate (TOPROL XL) 25 MG extended release tablet, TAKE 1 TABLET BY MOUTH EVERY DAY FOR BLOOD PRESSURE (Patient taking differently: Take 1 tablet by mouth daily), Disp: 90 tablet, Rfl: 3    levothyroxine (SYNTHROID) 25 MCG tablet, Take 1 tablet by mouth daily, Disp: 90 tablet, Rfl: 1    amLODIPine (NORVASC) 10 MG tablet, TAKE 1 TABLET BY MOUTH EVERY DAY (Patient  taking differently: Take 1 tablet by mouth every evening), Disp: 90 tablet, Rfl: 3    Multiple Vitamin (MULTIVITAMIN ADULT PO), Take 1 tablet by mouth daily, Disp: , Rfl:     calcium carbonate 1500 (600 Ca) MG TABS tablet, Take 1 tablet by mouth 2 times daily, Disp: , Rfl:     Cholecalciferol 50 MCG (2000 UT) TABS, Take 1 tablet by mouth daily, Disp: , Rfl:     glucosamine-chondroitin 500-400 MG CAPS, Take 1 capsule by mouth daily, Disp: , Rfl:   Allergies   Allergen Reactions    Codeine Shortness Of Breath    Lisinopril Cough     Past Surgical History:   Procedure Laterality Date    CHOLECYSTECTOMY  1980    HYSTERECTOMY, VAGINAL  2013    IR CHOLECYSTOSTOMY PERCUTANEOUS COMPLETE  1980    LUMBAR DISCECTOMY  1998    LUMBAR SPINE SURGERY Right 12/31/2021    Re-exploration Right L5-S1 with lateral recess decompression & diskectomy - Dr. Mats Agren    OTHER SURGICAL HISTORY  2013    pelvic floor, mesh and raise bladder with hysterectomy     Family History   Problem Relation Age   of Onset    Other Sister         prediabetic    Obesity Sister     Colon Cancer Sister 23        colostomy    Cancer Sister         colon    High Blood Pressure Brother     Mastectomy Maternal Grandmother 80        breast cancer    No Known Problems Maternal Grandfather     No Known Problems Paternal Grandmother     Diabetes Paternal Grandfather     Osteoarthritis Brother 67        2 knee replacement    High Blood Pressure Brother     No Known Problems Brother     Hypertension Mother     Diabetes Father     Uterine Cancer Neg Hx     Ovarian Cancer Neg Hx      Social History     Socioeconomic History    Marital status: Married     Spouse name: Not on file    Number of children: Not on file    Years of education: ADN    Highest education level: Not on file   Occupational History    Not on file   Tobacco Use    Smoking status: Former     Packs/day: 0.00     Years: 40.00     Additional pack years: 0.00     Total pack years: 0.00     Types: Cigarettes      Start date: 09/02/1979     Quit date: 09/26/2022     Years since quitting: 0.0    Smokeless tobacco: Never    Tobacco comments:     average 2 to 4 cigarettes daily   Vaping Use    Vaping Use: Never used   Substance and Sexual Activity    Alcohol use: Yes     Alcohol/week: 8.0 standard drinks of alcohol     Types: 8 Glasses of wine per week     Comment: "I drink wine daily"    Drug use: Never    Sexual activity: Yes     Partners: Male     Birth control/protection: Surgical     Comment: 52 years   Other Topics Concern    Not on file   Social History Narrative    y day.  Her husband does smoke cigars.  She does not use illicit drugs.  She does drink wine 1-2 glasses at night.    She enjoys her time with grand children, reading.    Wears a seat belt in a car.          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 Married to [employer].  4 children together:  - [place] child- 1 boy  -[place] child -1 girl  -Benjamine Mola ([place]) [phone] - 3 girls    She does currently smoke cigarettes - 1/2 a pack a week, not ever     Social Determinants of Health     Financial Resource Strain: Low Risk  (11/27/2021)    Overall Financial Resource Strain (CARDIA)     Difficulty of Paying Living Expenses: Not hard at all   Food Insecurity: No Food Insecurity (11/27/2021)    Hunger Vital Sign     Worried About Running Out of Food in the Last Year: Never true     Ran Out of  Food in the Last Year: Never true   Transportation Needs: Not on file   Physical Activity: Inactive (04/27/2022)    Exercise Vital Sign     Days of Exercise per Week: 0 days     Minutes of Exercise per Session: 0 min   Stress: Not on file   Social Connections: Not on file   Intimate Partner Violence: Not on file   Housing Stability: Not on file       ROS: 12- point ROS was reviewed as is negative except as above. She does admit to an occasional very brief flutter in her chest without any pain or dizziness. This happens rarely. She has never brought it up with her  PCP.    Examination: There were no vitals taken for this visit.  The patient is awake, alert and oriented times 3 and answering questions appropriately.   Breathing is unlabored  Heart has regular rate and rhythm   Abdomen is non-distended     Examination of bilateral lower extremities reveals neurovascularly intact limbs. Skin is warm, dry and intact.  There is no gross deformity of the left hip. Range of motion of the hip is limited in internal and external rotation. There is 5/5 muscle strength in EHL/FHL. Motor and sensation is intact throughout.     Imaging: Previous 2V left hip xray imaging reviewed by myself, demonstrates subchondral sclerosis, osteophytes and joint space narrowing indicative of severe osteoarthritis    Assessment: Left primary hip osteoarthritis     Plan: At this point the patient has failed conservative treatment for hip arthritis. We discussed the options, both operative and non-operative. We discussed the risks and benefits of surgery which include but are not limited to fracture, tendon rupture, gait disturbance, leg length discrepancy, dislocation, recurrent stiffness of the hip, infection, blood clots, nerve injury, vascular injury, periprosthetic fracture, implant failure, incomplete resolution of hip pain and the risks that come with anesthesia up to and including death. The patient understands the risks and has signed consent for left hip total arthroplasty on 11/03/22 with Dr Kovalenko     The patient understands and agrees with the plan.    -Approach - Direct Anterior  -Medical Clearance: PAT  -Booking Form: Completed  -VTE risk assessment: Standard risk  -Bleeding risk assessment:  Standard risk  -Post operative pharmacologic VTE prophylaxis plan: Enteric coated aspirin 81mg twice daily starting in the morning of post operative day 1 for 35 days  -Post operative non-pharmacologic VTE prophylaxis plan: 1) ambulation on the day of surgery 2) sequential compressive devices while in  bed  -Pre-op Pain management: Continue with current regimen; avoid narcotics  -Post-op Pain management: Cryotherapy for 15 minutes every 2 hours; Adductor canal local anesthetic; NSAID x 4 weeks; Oxycodone (only to be used for breakthrough pain)  -Administer TXA: Yes  -Perioperative Antibiotic: Ancef 2g and extended antibiotics with Cefadroxil 500mg BID x7 days post-op    -Disposition: Same Day Surgery  -Postop therapy: home PT followed by outpatient PT  -Last intraarticular injection: 06/10/22  Patient reminded to be NPO after midnight.  They did receive a Gatorade with instructions as well as chlorhexidine wipes with instructions.  They will report any health status changes prior to the surgical date.  They will need a ride home from surgery  All questions were answered.  Patient wishes to proceed with the above-stated surgery.

## 2022-10-22 LAB — HEMOGLOBIN A1C
Hemoglobin A1C: 5.2 % (ref <5.7)
eAG: 103 mg/dL

## 2022-10-23 LAB — NICOTINE AND METABOLITES
Cotinine: <3 ng/mL (ref <3.0)
Nicotine: <3 ng/mL (ref <3.0)

## 2022-10-23 LAB — CULTURE, URINE: Total Colony Count: >100000

## 2022-10-27 ENCOUNTER — Encounter

## 2022-10-27 LAB — VITAMIN D 25 HYDROXY: Vit D, 25-Hydroxy: 67.3 ng/mL

## 2022-10-27 MED ORDER — NITROFURANTOIN MONOHYD MACRO 100 MG PO CAPS
100 MG | ORAL_CAPSULE | Freq: Two times a day (BID) | ORAL | 0 refills | Status: AC
Start: 2022-10-27 — End: 2022-11-03

## 2022-10-27 NOTE — Telephone Encounter (Signed)
I called and spoke to patient. She has a UTI, Klebsiella pneumoniae, susceptible to nitrofurantoin. Medication sent 100mg  bid x7days.

## 2022-10-29 NOTE — Telephone Encounter (Signed)
Barbara Elliott is a 71 y.o. female      Patient is having surgery for :     lt tha sx 11/03/22    She missed her joint class on 10/27/22    She doesn't know what happened   She just forgot about it     Please call to discuss what  she should do     [phone] (home)

## 2022-10-29 NOTE — Telephone Encounter (Signed)
I called and spoke to Riddle. She will watch the online video.     I called the patient and reviewed the surgical plan and discharge plan for surgery next week.   Patient will discharge home on DOS.  Support person Husband Collier [place].  Steps to enter home: a few.  Patient was given opportunity to ask any questions or express any concerns.

## 2022-11-02 ENCOUNTER — Encounter

## 2022-11-02 MED ORDER — CEFADROXIL 500 MG PO CAPS
500 MG | ORAL_CAPSULE | Freq: Two times a day (BID) | ORAL | 0 refills | Status: AC
Start: 2022-11-02 — End: 2022-11-09

## 2022-11-02 MED ORDER — PANTOPRAZOLE SODIUM 40 MG PO TBEC
40 MG | ORAL_TABLET | Freq: Every day | ORAL | 0 refills | Status: DC
Start: 2022-11-02 — End: 2022-12-16

## 2022-11-02 MED ORDER — OXYCODONE HCL 5 MG PO TABS
5 MG | ORAL_TABLET | Freq: Four times a day (QID) | ORAL | 0 refills | Status: AC | PRN
Start: 2022-11-02 — End: 2022-11-09

## 2022-11-02 MED ORDER — ASPIRIN 81 MG PO TBEC
81 MG | ORAL_TABLET | Freq: Two times a day (BID) | ORAL | 0 refills | Status: DC
Start: 2022-11-02 — End: 2023-01-18

## 2022-11-02 MED ORDER — MELOXICAM 7.5 MG PO TABS
7.5 MG | ORAL_TABLET | Freq: Every day | ORAL | 0 refills | Status: DC
Start: 2022-11-02 — End: 2022-12-16

## 2022-11-02 MED ORDER — ACETAMINOPHEN 325 MG PO TABS
325 MG | ORAL_TABLET | Freq: Four times a day (QID) | ORAL | 2 refills | Status: DC
Start: 2022-11-02 — End: 2022-12-16

## 2022-11-02 MED ORDER — ONDANSETRON HCL 4 MG PO TABS
4 MG | ORAL_TABLET | Freq: Three times a day (TID) | ORAL | 0 refills | Status: DC | PRN
Start: 2022-11-02 — End: 2022-11-25

## 2022-11-02 MED ORDER — SENNA-DOCUSATE SODIUM 8.6-50 MG PO TABS
ORAL_TABLET | Freq: Every day | ORAL | 0 refills | Status: DC
Start: 2022-11-02 — End: 2022-11-25

## 2022-11-02 NOTE — Anesthesia Pre-Procedure Evaluation (Signed)
Department of Anesthesiology  Preprocedure Note       Name:  Barbara Elliott   Age:  71 y.o.  DOB:  [birth year]                                          MRN:  [id number]         Date:  11/02/2022      Surgeon: Moishe Spice):  Irena Reichmann, MD    Procedure: Procedure(s):  HIP TOTAL ARTHROPLASTY ANTERIOR APPROACH    Medications prior to admission:   Prior to Admission medications    Medication Sig Start Date End Date Taking? Authorizing Provider   acetaminophen (TYLENOL) 325 MG tablet Take 2 tablets by mouth every 6 hours 11/02/22   Ranell Patrick, APRN - NP   aspirin 81 MG EC tablet Take 1 tablet by mouth in the morning and at bedtime 11/02/22   Ranell Patrick, APRN - NP   cefadroxil (DURICEF) 500 MG capsule Take 1 capsule by mouth 2 times daily for 7 days 11/02/22 11/09/22  Ranell Patrick, APRN - NP   meloxicam [facility]) 7.5 MG tablet Take 1 tablet by mouth daily 11/02/22   Ranell Patrick, APRN - NP   ondansetron [facility]) 4 MG tablet Take 1 tablet by mouth every 8 hours as needed for Nausea or Vomiting 11/02/22   Ranell Patrick, APRN - NP   oxyCODONE (ROXICODONE) 5 MG immediate release tablet Take 1-2 tablets by mouth every 6 hours as needed for Pain (acute post op pain) for up to 7 days. Max Daily Amount: 40 mg 11/02/22 11/09/22  Ranell Patrick, APRN - NP   pantoprazole (PROTONIX) 40 MG tablet Take 1 tablet by mouth daily 11/02/22   Ranell Patrick, APRN - NP   sennosides-docusate sodium (SENOKOT-S) 8.6-50 MG tablet Take 1 tablet by mouth daily 11/02/22   Ranell Patrick, APRN - NP   nitrofurantoin, macrocrystal-monohydrate, (MACROBID) 100 MG capsule Take 1 capsule by mouth 2 times daily for 7 days 10/27/22 11/03/22  Balinda Quails, PA   acetaminophen (TYLENOL) 500 MG tablet Take 2 tablets by mouth every 6 hours as needed for Pain    [provider]   ibuprofen (ADVIL;MOTRIN) 200 MG tablet Take 2 tablets by mouth every 6 hours as needed for Pain    [provider]   atorvastatin (LIPITOR) 40 MG tablet TAKE 1 TABLET BY MOUTH EVERY DAY  Patient taking differently: Take 1 tablet by mouth every evening 10/07/22   Ruthine Dose, APRN - NP   metoprolol succinate (TOPROL XL) 25 MG extended release tablet TAKE 1 TABLET BY MOUTH EVERY DAY FOR BLOOD PRESSURE  Patient taking differently: Take 1 tablet by mouth daily 10/07/22   Ruthine Dose, APRN - NP   levothyroxine (SYNTHROID) 25 MCG tablet Take 1 tablet by mouth daily 05/28/22   Ruthine Dose, APRN - NP   amLODIPine (NORVASC) 10 MG tablet TAKE 1 TABLET BY MOUTH EVERY DAY  Patient taking differently: Take 1 tablet by mouth every evening 05/22/22   Ralene Ok, MD   Multiple Vitamin (MULTIVITAMIN ADULT PO) Take 1 tablet by mouth daily    [provider]   calcium carbonate 1500 (600 Ca) MG TABS tablet Take 1 tablet by mouth 2 times daily    Automatic Reconciliation, Ar   Cholecalciferol 50 MCG (  2000 UT) TABS Take 1 tablet by mouth daily    Automatic Reconciliation, Ar   glucosamine-chondroitin 500-400 MG CAPS Take 1 capsule by mouth daily    Automatic Reconciliation, Ar       Current medications:    Current Outpatient Medications   Medication Sig Dispense Refill   . acetaminophen (TYLENOL) 325 MG tablet Take 2 tablets by mouth every 6 hours 56 tablet 2   . aspirin 81 MG EC tablet Take 1 tablet by mouth in the morning and at bedtime 70 tablet 0   . cefadroxil (DURICEF) 500 MG capsule Take 1 capsule by mouth 2 times daily for 7 days 14 capsule 0   . meloxicam (MOBIC) 7.5 MG tablet Take 1 tablet by mouth daily 30 tablet 0   . ondansetron (ZOFRAN) 4 MG tablet Take 1 tablet by mouth every 8 hours as needed for Nausea or Vomiting 21 tablet 0   . oxyCODONE (ROXICODONE) 5 MG immediate release tablet Take 1-2 tablets by mouth every 6 hours as needed for Pain (acute post op pain) for up to 7 days. Max Daily Amount: 40 mg 56 tablet 0   . pantoprazole (PROTONIX) 40 MG tablet Take 1 tablet  by mouth daily 30 tablet 0   . sennosides-docusate sodium (SENOKOT-S) 8.6-50 MG tablet Take 1 tablet by mouth daily 30 tablet 0   . nitrofurantoin, macrocrystal-monohydrate, (MACROBID) 100 MG capsule Take 1 capsule by mouth 2 times daily for 7 days 14 capsule 0   . acetaminophen (TYLENOL) 500 MG tablet Take 2 tablets by mouth every 6 hours as needed for Pain     . ibuprofen (ADVIL;MOTRIN) 200 MG tablet Take 2 tablets by mouth every 6 hours as needed for Pain     . atorvastatin (LIPITOR) 40 MG tablet TAKE 1 TABLET BY MOUTH EVERY DAY (Patient taking differently: Take 1 tablet by mouth every evening) 90 tablet 3   . metoprolol succinate (TOPROL XL) 25 MG extended release tablet TAKE 1 TABLET BY MOUTH EVERY DAY FOR BLOOD PRESSURE (Patient taking differently: Take 1 tablet by mouth daily) 90 tablet 3   . levothyroxine (SYNTHROID) 25 MCG tablet Take 1 tablet by mouth daily 90 tablet 1   . amLODIPine (NORVASC) 10 MG tablet TAKE 1 TABLET BY MOUTH EVERY DAY (Patient taking differently: Take 1 tablet by mouth every evening) 90 tablet 3   . Multiple Vitamin (MULTIVITAMIN ADULT PO) Take 1 tablet by mouth daily     . calcium carbonate 1500 (600 Ca) MG TABS tablet Take 1 tablet by mouth 2 times daily     . Cholecalciferol 50 MCG (2000 UT) TABS Take 1 tablet by mouth daily     . glucosamine-chondroitin 500-400 MG CAPS Take 1 capsule by mouth daily       No current facility-administered medications for this visit.       Allergies:    Allergies   Allergen Reactions   . Codeine Shortness Of Breath   . Lisinopril Cough       Problem List:    Patient Active Problem List   Diagnosis Code   . Encounter for hepatitis C screening test for low risk patient Z11.59   . Hypertension I10   . Preventative health care Z00.00   . Mixed hyperlipidemia E78.2   . Left hip pain M25.552   . Encounter for immunization Z23   . Encounter for subsequent annual wellness visit (AWV) in Medicare patient Z00.00   . Vitamin D deficiency E55.9   .  Lumbar  radiculopathy M54.16   . Preoperative clearance Z01.818   . At high risk for falls Z91.81   . History of lumbar discectomy Z98.890   . 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 Post-menopausal Z78.0   . 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 Chronic back pain 05/2021    sciatic   . Headache    . High blood pressure    . High cholesterol    . Hypothyroidism     states hx of it but has resolved, TSH on 03/25/21 was 3.970   . Osteoarthritis 2022   . Osteoporosis 2020   . Prediabetes 01/30/2021   . Primary localized osteoarthrosis 09/04/2021   . 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    surgery Dec 31 2021   . Wears glasses        Past Surgical History:        Procedure Laterality Date   . CHOLECYSTECTOMY  1980   . HYSTERECTOMY, VAGINAL  2013   . IR CHOLECYSTOSTOMY PERCUTANEOUS COMPLETE  1980   . LUMBAR DISCECTOMY  1998   . LUMBAR SPINE SURGERY Right 12/31/2021    Re-exploration Right L5-S1 with lateral recess decompression & diskectomy - Dr. Marcille Buffy   . OTHER SURGICAL HISTORY  2013    pelvic floor, mesh and raise bladder with hysterectomy       Social History:    Social History     Tobacco Use   . Smoking status: Former     Packs/day: 0.00     Years: 40.00     Additional pack years: 0.00     Total pack years: 0.00     Types: Cigarettes     Start date: 09/02/1979     Quit date: 09/26/2022     Years since quitting: 0.1   . Smokeless tobacco: Never   . Tobacco comments:     average 2 to 4 cigarettes daily   Substance Use Topics   . Alcohol use: Yes     Alcohol/week: 8.0 standard drinks of alcohol     Types: 8 Glasses of wine per week     Comment: "I drink wine daily"                                Counseling given: Not Answered  Tobacco comments: average 2 to 4 cigarettes daily      Vital Signs (Current): There were no vitals filed for this visit.                                           BP Readings from Last 3 Encounters:    10/21/22 (!) 150/82   10/15/22 138/82   06/29/22 138/82       NPO Status:  BMI:   Wt Readings from Last 3 Encounters:   10/21/22 67.6 kg (149 lb)   10/20/22 67.6 kg (149 lb)   10/15/22 68.7 kg (151 lb 6.4 oz)     There is no height or weight on file to calculate BMI.    CBC:   Lab Results   Component Value Date/Time    WBC 11.4 10/21/2022 02:05 PM    RBC 4.41 10/21/2022 02:05 PM    HGB 14.2 10/21/2022 02:05 PM    HCT 42.2 10/21/2022 02:05 PM    MCV 95.7 10/21/2022 02:05 PM    RDW 12.0 10/21/2022 02:05 PM    PLT 354 10/21/2022 02:05 PM       CMP:   Lab Results   Component Value Date/Time    NA 134 10/21/2022 02:05 PM    K 4.0 10/21/2022 02:05 PM    CL 99 10/21/2022 02:05 PM    CO2 29 10/21/2022 02:05 PM    BUN 17 10/21/2022 02:05 PM    CREATININE 0.91 10/21/2022 02:05 PM    GFRAA >60 03/25/2021 08:51 AM    LABGLOM >60 10/21/2022 02:05 PM    GLUCOSE 90 10/21/2022 02:05 PM    PROT 8.1 10/21/2022 02:05 PM    CALCIUM 9.7 10/21/2022 02:05 PM    BILITOT 0.80 10/21/2022 02:05 PM    ALKPHOS 84 10/21/2022 02:05 PM    ALKPHOS 85 03/25/2021 08:51 AM    AST 24 10/21/2022 02:05 PM    ALT 35 10/21/2022 02:05 PM       POC Tests: No results for input(s): "POCGLU", "POCNA", "POCK", "POCCL", "POCBUN", "POCHEMO", "POCHCT" in the last 72 hours.    Coags: No results found for: "PROTIME", "INR", "APTT"    HCG (If Applicable): No results found for: "PREGTESTUR", "PREGSERUM", "HCG", "HCGQUANT"     ABGs: No results found for: "PHART", "PO2ART", "PCO2ART", "HCO3ART", "BEART", "O2SATART"     Type & Screen (If Applicable):  No results found for: "LABABO", "LABRH"    Drug/Infectious Status (If Applicable):  Lab Results   Component Value Date/Time    HEPCAB NONREACTIVE 03/25/2021 08:51 AM       COVID-19 Screening (If Applicable): No results found for: "COVID19"        Anesthesia Evaluation  Patient summary reviewed  Airway: Mallampati: II  TM distance: >3 FB   Neck  ROM: full  Mouth opening: > = 3 FB   Dental:    (+) upper dentures      Pulmonary:   (+) current smoker (Occasional)                           Cardiovascular:  Exercise tolerance: good (>4 METS),   (+) hypertension:, hyperlipidemia                  Neuro/Psych:   (+) neuromuscular disease:, headaches:,             GI/Hepatic/Renal: Neg GI/Hepatic/Renal ROS            Endo/Other: Negative Endo/Other ROS   (+) hypothyroidism: arthritis: OA., .          Pt had PAT visit.       Abdominal:             Vascular:          Other Findings:             Anesthesia Plan      spinal  ASA 2       Induction: intravenous.    MIPS: Postoperative opioids intended and Prophylactic antiemetics administered.                        Cristal Generous, MD   11/02/2022

## 2022-11-02 NOTE — Discharge Instructions (Addendum)
Anterior Total Hip Replacement Discharge Instructions    [facility]     Call for any concerns: [phone]    [address]     Follow up office visit: 3 weeks with Dr Luna Fuse, [place]    Blood clot prevention: Aspirin 81mg  twice a day for 35 days after surgery, walk AND wear compressive stockings for 4 weeks after surgery    Prescribed medications: take all mediations as prescribed  Oxycodone: every 6 hours as needed for pain  Acetaminophen: every 6 hours for pain  Meloxicam: once daily x30 days to decrease inflammation  Senna/docusate: once daily as need for constipation  Pantoprazole: once daily for prevention of ulcers that can be caused by NSAIDs  Ondansetron: as needed for nausea  Cefadroxil: this is a prophylactic antibiotic to help prevent infection     Follow Up Labs/Diagnostics: None    Weight Bearing Status: Full Weight Bearing    Assistive Device: Progress to cane or 1 crutch as soon as safe    Activity: Take your pain medication about 30 minutes before physical therapy  Ice may be used for pain relief. -- About 20 minutes every 2 hours or so  Resume Normal Activity.    Wound Care/Self Post-Surgery Care:  If your incision becomes red, swollen, has drainage (pus or foul odor drainage, blood)  If you spike a temperature higher than 100 degrees F, have chills or fever call the office if you also develop any of these symptoms:    You have muscle, joint or body aches    Your skin is itchy, swollen or you develop a rash    You have calf pain or swelling  Call 911 and go to the Emergency Room if you suddenly have trouble breathing or chest pain    Dressing: PICO dressing will be left in place for only 12-14 days, unless soiled or loose.  If removed and staples (sutures) are present, place a dry sterile dressing over the wound.  If no staples (sutures) are present, no dressing needs to be applied unless there is drainage.    Staples are to be removed in your surgeon's office, by the Skilled  Nursing Facility Nurse or by the Home Health nurse 12-14 days post-op. Sometimes the skin is closed with absorbable sutures - these do not need to be removed.     MAY SHOWER with PICO dressing over wound. No tub baths until wound is completely healed (usually approx. 4 weeks)    [facility] Stockings On in the morning, off at Bedtime; both legs. Wear for 4 weeks.    Post Discharge Treatments:  PT/OT evaluate & treat - Anterior Total Hip protocol to include:  Progress from walker to crutches to cane Special Restrictions:  No Driving until:    able to walk with 1 crutch or cane;    have good control of leg;    no longer taking narcotic pain medications during the day (typically this takes 6 weeks)    Long-term Restrictions:  No running, jumping, or jerking motions  Hip components may activate metal detectors  Maintain a normal weight; goal BMI less than 25    Diet:  Encourage high protein foods  Frequent small meals  Drink enough fluids to make urine light yellow       [facility]  [address]  [place]  [phone]         [facility] Surgical Discharge Instructions  What you NEED TO KNOW after having anesthesia:  It may stay in your system for 24 hours this may make you feel tired.   NO driving OR operating heavy machinery for 24 hours.  NO drinking alcohol for 24 hours  Avoid sudden changes in body position or head movements as this can make you dizzy and nauseated. Make sure to pause in between position changes and take your time getting up to walk.   Nausea can be a very undesirable side effect of many pain medications and anesthesia that you received. Important things to note at home to help with nausea:  Start with small amounts of food first, you may progress to normal diet as tolerated. Be cautious about spicy or greasy foods as this may upset your stomach.   Eat food BEFORE taking pain medication  Drink plenty of clear liquids to stay hydrated (water, Gatorade, ginger  ale)  Resting for the next 24 is important for your successful recovery.   If you were prescribed anti-nausea medication (Zofran, Promethazine) you may take this with your pain medication. If you were not prescribed this and are having persistent nausea, contact your surgeon's office.   Stay AHEAD of your PAIN at home:  Take your pain medication BEFORE your pain becomes INTOLERABLE it may take up to 1 hour before pain relief occurs.   ICE & ELEVATION. Avoid standing upright for long periods or having surgical extremity down below your heart.   If pain is getting significantly worse despite pain medication call your surgeons office.     MEDICATION INSTRUCTIONS:     What to do if there is BLOOD ON YOUR DRESSING?  If excessive or unexpected drainage occurs, please contact your surgeon.  Before calling your surgeons office, you may want to mark the spot with a marker or pen, so edges are well defined. It may be beneficial to put the exact time, this will allow healthcare providers to determine how much bleeding occurs over the course of time.  Signs of Infection - Call your Surgeons Office If you are experiencing any of these symptoms.  Fever over 101 F or Higher   Chills  Increased pain not relieved by pain medication  Increased swelling and/or redness around incision   Persistent Nausea & Vomiting               Cold Therapy for your comfort and rehabilitation  Your caregivers want you to be productive in your rehab and comfortable during your stay. In keeping with those goals, you will be receiving an [facility] Cold Therapy Wrap to help ease post-operative pain and swelling that might keep you from getting back on track! Your [facility] Cold Therapy Wrap is effective and simple-to-use, and you will be encouraged to apply it throughout your hospital stay and at home after discharge.    When you are ready to go home  Be sure to take your [facility] Cold Therapy Wrap and Gel Bags with you for continued comfort and use throughout your  rehabilitation. If you don't already have them, ask your nurse or aide to retrieve your [facility] Gel Bags from the patient freezer.    Home use precautions  Always follow your physician or caregiver's specific application instructions upon discharge. Your [facility] Cold Therapy Wrap and Gel Bags are designed to last for months following your surgery.    Cold Therapy Instructions   - Once frozen, slide Gel Bags into the gel pouch and secure your wrap to the affected  area with the straps.    - The gel pouch acts as a protective barrier. NEVER place the blue frozen Gel Bags directly onto skin, as this may cause frostbite injury.   - The [facility] Cold Therapy Wrap is designed to be able to be worn while ambulating.  The compression straps can be secured well enough so that the Wrap won't fall off while moving.   - Wrap Application Videos can be viewed at [email or web address].   - An additional protective barrier such as clothing, a washcloth, hand-towel or pillowcase may be used during prolonged treatment applications.   - Gel Bags freeze the best when laid flat in your freezer, side-by-side instead of stacked.   - The Gel Pouch and Wrap are both Latex-Free and the Gel Bag ingredients are non-toxic.    [facility] Wrap care instructions  The [facility] Cold Therapy Wrap can be hand/machine washed and air-dried.

## 2022-11-03 ENCOUNTER — Inpatient Hospital Stay: Payer: Medicare (Managed Care) | Attending: Orthopaedic Surgery

## 2022-11-03 ENCOUNTER — Non-Acute Institutional Stay: Admit: 2022-11-03 | Payer: MEDICARE | Primary: Family

## 2022-11-03 ENCOUNTER — Encounter

## 2022-11-03 LAB — POCT GLUCOSE: POC Glucose: 105 mg/dL

## 2022-11-03 MED ORDER — NORMAL SALINE FLUSH 0.9 % IV SOLN
0.9 % | Freq: Two times a day (BID) | INTRAVENOUS | Status: DC
Start: 2022-11-03 — End: 2022-11-03

## 2022-11-03 MED ORDER — BISACODYL 10 MG RE SUPP
10 MG | Freq: Every day | RECTAL | Status: DC | PRN
Start: 2022-11-03 — End: 2022-11-03

## 2022-11-03 MED ORDER — LIDOCAINE 2 % BUFFERED WITH 8.4 % SODIUM BICARB SYRINGE
Freq: Once | INTRAMUSCULAR | Status: AC
Start: 2022-11-03 — End: 2022-11-03
  Administered 2022-11-03: 15:00:00 3 mL via INTRADERMAL

## 2022-11-03 MED ORDER — POVIDONE-IODINE 10 % EX SOLN
10 % | CUTANEOUS | Status: DC | PRN
Start: 2022-11-03 — End: 2022-11-03
  Administered 2022-11-03: 18:00:00 515

## 2022-11-03 MED ORDER — OXYCODONE HCL 10 MG PO TABS
10 MG | ORAL_TABLET | Freq: Four times a day (QID) | ORAL | 0 refills | Status: AC | PRN
Start: 2022-11-03 — End: 2022-11-10

## 2022-11-03 MED ORDER — KETOROLAC TROMETHAMINE 30 MG/ML IJ SOLN
30 MG/ML | INTRAMUSCULAR | Status: AC
Start: 2022-11-03 — End: ?

## 2022-11-03 MED ORDER — VANCOMYCIN HCL 1 G IV SOLR
1 g | INTRAVENOUS | Status: DC | PRN
Start: 2022-11-03 — End: 2022-11-03
  Administered 2022-11-03: 18:00:00 1000 via TOPICAL

## 2022-11-03 MED ORDER — KETAMINE HCL-SODIUM CHLORIDE 50-0.9 MG/5ML-% IV SOSY
INTRAVENOUS | Status: AC
Start: 2022-11-03 — End: ?

## 2022-11-03 MED ORDER — OXYCODONE HCL 5 MG PO TABS
5 MG | ORAL | Status: DC | PRN
Start: 2022-11-03 — End: 2022-11-03

## 2022-11-03 MED ORDER — CEFAZOLIN SODIUM-DEXTROSE 2-4 GM/100ML-% IV SOLN
2-4 GM/100ML-% | Freq: Three times a day (TID) | INTRAVENOUS | Status: DC
Start: 2022-11-03 — End: 2022-11-03

## 2022-11-03 MED ORDER — DEXAMETHASONE SODIUM PHOSPHATE 4 MG/ML IJ SOLN
4 MG/ML | INTRAMUSCULAR | Status: AC
Start: 2022-11-03 — End: ?

## 2022-11-03 MED ORDER — HYDRALAZINE HCL 20 MG/ML IJ SOLN
20 MG/ML | INTRAMUSCULAR | Status: DC | PRN
Start: 2022-11-03 — End: 2022-11-03

## 2022-11-03 MED ORDER — FENTANYL CITRATE (PF) 100 MCG/2ML IJ SOLN
100 | INTRAMUSCULAR | Status: DC | PRN
Start: 2022-11-03 — End: 2022-11-03

## 2022-11-03 MED ORDER — NORMAL SALINE FLUSH 0.9 % IV SOLN
0.9 % | INTRAVENOUS | Status: DC | PRN
Start: 2022-11-03 — End: 2022-11-03

## 2022-11-03 MED ORDER — MAGNESIUM HYDROXIDE 400 MG/5ML PO SUSP
400 MG/5ML | Freq: Every day | ORAL | Status: DC | PRN
Start: 2022-11-03 — End: 2022-11-03

## 2022-11-03 MED ORDER — EPHEDRINE SULFATE (PRESSORS) 50 MG/ML IV SOLN
50 MG/ML | INTRAVENOUS | Status: AC
Start: 2022-11-03 — End: ?

## 2022-11-03 MED ORDER — ONDANSETRON HCL 4 MG/2ML IJ SOLN
4 MG/2ML | Freq: Once | INTRAMUSCULAR | Status: DC | PRN
Start: 2022-11-03 — End: 2022-11-03

## 2022-11-03 MED ORDER — HYDROMORPHONE HCL 2 MG/ML IJ SOLN
2 MG/ML | INTRAMUSCULAR | Status: AC
Start: 2022-11-03 — End: ?

## 2022-11-03 MED ORDER — LIDOCAINE 2 % BUFFERED WITH 8.4 % SODIUM BICARB SYRINGE
Freq: Once | INTRAMUSCULAR | Status: DC
Start: 2022-11-03 — End: 2022-11-03

## 2022-11-03 MED ORDER — LACTATED RINGERS IV SOLN
INTRAVENOUS | Status: DC
Start: 2022-11-03 — End: 2022-11-03
  Administered 2022-11-03 (×2): via INTRAVENOUS

## 2022-11-03 MED ORDER — DIPHENHYDRAMINE HCL 50 MG/ML IJ SOLN
50 MG/ML | INTRAMUSCULAR | Status: DC | PRN
Start: 2022-11-03 — End: 2022-11-03
  Administered 2022-11-03: 17:00:00 5 via INTRAVENOUS

## 2022-11-03 MED ORDER — METOCLOPRAMIDE HCL 5 MG/ML IJ SOLN
5 MG/ML | Freq: Once | INTRAMUSCULAR | Status: DC
Start: 2022-11-03 — End: 2022-11-03

## 2022-11-03 MED ORDER — FENTANYL CITRATE (PF) 100 MCG/2ML IJ SOLN
100 MCG/2ML | INTRAMUSCULAR | Status: AC
Start: 2022-11-03 — End: ?

## 2022-11-03 MED ORDER — ASPIRIN 81 MG PO TBEC
81 MG | Freq: Two times a day (BID) | ORAL | Status: DC
Start: 2022-11-03 — End: 2022-11-03

## 2022-11-03 MED ORDER — KETOROLAC TROMETHAMINE 30 MG/ML IJ SOLN
30 MG/ML | Freq: Four times a day (QID) | INTRAMUSCULAR | Status: DC
Start: 2022-11-03 — End: 2022-11-03

## 2022-11-03 MED ORDER — ONDANSETRON HCL 4 MG/2ML IJ SOLN
4 MG/2ML | Freq: Four times a day (QID) | INTRAMUSCULAR | Status: DC | PRN
Start: 2022-11-03 — End: 2022-11-03
  Administered 2022-11-03: 20:00:00 4 mg via INTRAVENOUS

## 2022-11-03 MED ORDER — PROPOFOL 200 MG/20ML IV EMUL
200 MG/20ML | INTRAVENOUS | Status: AC
Start: 2022-11-03 — End: ?

## 2022-11-03 MED ORDER — LORAZEPAM 2 MG/ML IJ SOLN
2 MG/ML | INTRAMUSCULAR | Status: DC | PRN
Start: 2022-11-03 — End: 2022-11-03

## 2022-11-03 MED ORDER — SODIUM CHLORIDE (PF) 0.9 % IJ SOLN
0.9 | Freq: Once | INTRAMUSCULAR | Status: AC
Start: 2022-11-03 — End: 2022-11-03
  Administered 2022-11-03: 18:00:00 30

## 2022-11-03 MED ORDER — METHOCARBAMOL 1000 MG/10ML IJ SOLN
1000 MG/10ML | INTRAMUSCULAR | Status: DC | PRN
Start: 2022-11-03 — End: 2022-11-03
  Administered 2022-11-03: 18:00:00 750 via INTRAVENOUS

## 2022-11-03 MED ORDER — MEPIVACAINE HCL (PF) 1.5 % IJ SOLN
1.5 % | INTRAMUSCULAR | Status: AC
Start: 2022-11-03 — End: 2022-11-03
  Administered 2022-11-03: 17:00:00 54 via INTRATHECAL

## 2022-11-03 MED ORDER — SODIUM CHLORIDE 0.9 % IR SOLN
0.9 % | Status: AC | PRN
Start: 2022-11-03 — End: 2022-11-03
  Administered 2022-11-03: 17:00:00 500
  Administered 2022-11-03: 18:00:00 3000

## 2022-11-03 MED ORDER — LIDOCAINE HCL (PF) 2 % IJ SOLN
2 % | INTRAMUSCULAR | Status: DC | PRN
Start: 2022-11-03 — End: 2022-11-03
  Administered 2022-11-03: 17:00:00 50 via INTRAVENOUS

## 2022-11-03 MED ORDER — DIPHENHYDRAMINE HCL 50 MG/ML IJ SOLN
50 MG/ML | Freq: Once | INTRAMUSCULAR | Status: DC | PRN
Start: 2022-11-03 — End: 2022-11-03

## 2022-11-03 MED ORDER — PROPOFOL 200 MG/20ML IV EMUL
200 MG/20ML | INTRAVENOUS | Status: DC | PRN
Start: 2022-11-03 — End: 2022-11-03
  Administered 2022-11-03 (×2): 30 via INTRAVENOUS
  Administered 2022-11-03: 17:00:00 20 via INTRAVENOUS

## 2022-11-03 MED ORDER — DIPHENHYDRAMINE HCL 50 MG/ML IJ SOLN
50 MG/ML | INTRAMUSCULAR | Status: AC
Start: 2022-11-03 — End: ?

## 2022-11-03 MED ORDER — ONDANSETRON 4 MG PO TBDP
4 MG | Freq: Three times a day (TID) | ORAL | Status: DC | PRN
Start: 2022-11-03 — End: 2022-11-03

## 2022-11-03 MED ORDER — ACETAMINOPHEN 325 MG PO TABS
325 MG | Freq: Four times a day (QID) | ORAL | Status: DC
Start: 2022-11-03 — End: 2022-11-03

## 2022-11-03 MED ORDER — METHOCARBAMOL 1000 MG/10ML IJ SOLN
1000 MG/10ML | INTRAMUSCULAR | Status: AC
Start: 2022-11-03 — End: ?

## 2022-11-03 MED ORDER — OXYCODONE HCL 5 MG PO TABS
5 MG | ORAL | Status: DC | PRN
Start: 2022-11-03 — End: 2022-11-03
  Administered 2022-11-03: 19:00:00 5 mg via ORAL

## 2022-11-03 MED ORDER — MORPHINE SULFATE (PF) 2 MG/ML IV SOLN
2 MG/ML | INTRAVENOUS | Status: DC | PRN
Start: 2022-11-03 — End: 2022-11-03

## 2022-11-03 MED ORDER — FAMOTIDINE 20 MG PO TABS
20 MG | Freq: Once | ORAL | Status: AC
Start: 2022-11-03 — End: 2022-11-03
  Administered 2022-11-03: 15:00:00 20 mg via ORAL

## 2022-11-03 MED ORDER — HYDROMORPHONE HCL 1 MG/ML IJ SOLN
1 MG/ML | INTRAMUSCULAR | Status: DC | PRN
Start: 2022-11-03 — End: 2022-11-03

## 2022-11-03 MED ORDER — DEXAMETHASONE SODIUM PHOSPHATE 4 MG/ML IJ SOLN
4 MG/ML | INTRAMUSCULAR | Status: DC | PRN
Start: 2022-11-03 — End: 2022-11-03
  Administered 2022-11-03: 17:00:00 8 via INTRAVENOUS

## 2022-11-03 MED ORDER — TRANEXAMIC ACID-NACL 1000-0.7 MG/100ML-% IV SOLN
Freq: Once | INTRAVENOUS | Status: AC | PRN
Start: 2022-11-03 — End: 2022-11-03
  Administered 2022-11-03: 18:00:00 1000 mg via INTRAVENOUS

## 2022-11-03 MED ORDER — FENTANYL CITRATE (PF) 100 MCG/2ML IJ SOLN
100 MCG/2ML | INTRAMUSCULAR | Status: DC | PRN
Start: 2022-11-03 — End: 2022-11-03
  Administered 2022-11-03: 19:00:00 50 via INTRAVENOUS

## 2022-11-03 MED ORDER — SODIUM CHLORIDE 0.9 % IV SOLN
0.9 % | INTRAVENOUS | Status: DC
Start: 2022-11-03 — End: 2022-11-03

## 2022-11-03 MED ORDER — KETOROLAC TROMETHAMINE 30 MG/ML IJ SOLN
30 MG/ML | INTRAMUSCULAR | Status: DC | PRN
Start: 2022-11-03 — End: 2022-11-03
  Administered 2022-11-03: 19:00:00 30 via INTRAVENOUS

## 2022-11-03 MED ORDER — KETAMINE HCL-SODIUM CHLORIDE 50-0.9 MG/5ML-% IV SOSY
INTRAVENOUS | Status: DC | PRN
Start: 2022-11-03 — End: 2022-11-03
  Administered 2022-11-03 (×4): 5 via INTRAVENOUS

## 2022-11-03 MED ORDER — ACETAMINOPHEN 10 MG/ML IV SOLN
10 MG/ML | INTRAVENOUS | Status: AC
Start: 2022-11-03 — End: ?

## 2022-11-03 MED ORDER — LIDOCAINE HCL (PF) 2 % IJ SOLN
2 % | INTRAMUSCULAR | Status: AC
Start: 2022-11-03 — End: ?

## 2022-11-03 MED ORDER — MEPERIDINE HCL 25 MG/ML IJ SOLN
25 MG/ML | INTRAMUSCULAR | Status: DC | PRN
Start: 2022-11-03 — End: 2022-11-03

## 2022-11-03 MED ORDER — ACETAMINOPHEN 10 MG/ML IV SOLN
10 MG/ML | INTRAVENOUS | Status: DC | PRN
Start: 2022-11-03 — End: 2022-11-03
  Administered 2022-11-03: 18:00:00 1000 via INTRAVENOUS

## 2022-11-03 MED ORDER — CEFAZOLIN SODIUM-DEXTROSE 2-4 GM/100ML-% IV SOLN
2-4 GM/100ML-% | INTRAVENOUS | Status: AC
Start: 2022-11-03 — End: 2022-11-03
  Administered 2022-11-03: 17:00:00 2000 mg via INTRAVENOUS

## 2022-11-03 MED ORDER — MIDAZOLAM HCL (PF) 2 MG/2ML IJ SOLN
2 MG/ML | Freq: Once | INTRAMUSCULAR | Status: AC
Start: 2022-11-03 — End: 2022-11-03
  Administered 2022-11-03: 17:00:00 2 mg via INTRAVENOUS

## 2022-11-03 MED ORDER — SENNA-DOCUSATE SODIUM 8.6-50 MG PO TABS
Freq: Two times a day (BID) | ORAL | Status: DC
Start: 2022-11-03 — End: 2022-11-03

## 2022-11-03 MED ORDER — LACTATED RINGERS IV SOLN
INTRAVENOUS | Status: DC
Start: 2022-11-03 — End: 2022-11-03

## 2022-11-03 MED ORDER — TRANEXAMIC ACID-NACL 1000-0.7 MG/100ML-% IV SOLN
INTRAVENOUS | Status: AC
Start: 2022-11-03 — End: 2022-11-03
  Administered 2022-11-03: 17:00:00 1000 mg via INTRAVENOUS

## 2022-11-03 MED FILL — LACTATED RINGERS IV SOLN: INTRAVENOUS | Qty: 1000

## 2022-11-03 MED FILL — HYDROMORPHONE HCL 2 MG/ML IJ SOLN: 2 MG/ML | INTRAMUSCULAR | Qty: 1

## 2022-11-03 MED FILL — TRANEXAMIC ACID-NACL 1000-0.7 MG/100ML-% IV SOLN: INTRAVENOUS | Qty: 100

## 2022-11-03 MED FILL — LIDOCAINE HCL (PF) 2 % IJ SOLN: 2 % | INTRAMUSCULAR | Qty: 5

## 2022-11-03 MED FILL — BD POSIFLUSH 0.9 % IV SOLN: 0.9 % | INTRAVENOUS | Qty: 40

## 2022-11-03 MED FILL — MILK OF MAGNESIA 7.75 % PO SUSP: 7.75 % | ORAL | Qty: 30

## 2022-11-03 MED FILL — CEFAZOLIN SODIUM-DEXTROSE 2-4 GM/100ML-% IV SOLN: 2-4 GM/100ML-% | INTRAVENOUS | Qty: 100

## 2022-11-03 MED FILL — ONDANSETRON HCL 4 MG/2ML IJ SOLN: 4 MG/2ML | INTRAMUSCULAR | Qty: 2

## 2022-11-03 MED FILL — DEXAMETHASONE SODIUM PHOSPHATE 4 MG/ML IJ SOLN: 4 MG/ML | INTRAMUSCULAR | Qty: 2

## 2022-11-03 MED FILL — KETAMINE HCL-SODIUM CHLORIDE 50-0.9 MG/5ML-% IV SOSY: INTRAVENOUS | Qty: 5

## 2022-11-03 MED FILL — ACETAMINOPHEN 10 MG/ML IV SOLN: 10 MG/ML | INTRAVENOUS | Qty: 100

## 2022-11-03 MED FILL — FAMOTIDINE 20 MG PO TABS: 20 MG | ORAL | Qty: 1

## 2022-11-03 MED FILL — OXYCODONE HCL 5 MG PO TABS: 5 MG | ORAL | Qty: 1

## 2022-11-03 MED FILL — SENNOSIDES-DOCUSATE SODIUM 8.6-50 MG PO TABS: ORAL | Qty: 1

## 2022-11-03 MED FILL — METHOCARBAMOL 1000 MG/10ML IJ SOLN: 1000 MG/10ML | INTRAMUSCULAR | Qty: 10

## 2022-11-03 MED FILL — SODIUM CHLORIDE 0.9 % IV SOLN: 0.9 % | INTRAVENOUS | Qty: 1000

## 2022-11-03 MED FILL — LIDOCAINE 2 % BUFFERED WITH 8.4 % SODIUM BICARB SYRINGE: INTRAMUSCULAR | Qty: 3

## 2022-11-03 MED FILL — EPHEDRINE SULFATE (PRESSORS) 50 MG/ML IV SOLN: 50 MG/ML | INTRAVENOUS | Qty: 1

## 2022-11-03 MED FILL — MIDAZOLAM HCL 2 MG/2ML IJ SOLN: 2 MG/ML | INTRAMUSCULAR | Qty: 2

## 2022-11-03 MED FILL — BUPIVACAINE HCL (PF) 0.5 % IJ SOLN: 0.5 % | INTRAMUSCULAR | Qty: 0

## 2022-11-03 MED FILL — BISACODYL 10 MG RE SUPP: 10 MG | RECTAL | Qty: 1

## 2022-11-03 MED FILL — KETOROLAC TROMETHAMINE 30 MG/ML IJ SOLN: 30 MG/ML | INTRAMUSCULAR | Qty: 1

## 2022-11-03 MED FILL — DIPHENHYDRAMINE HCL 50 MG/ML IJ SOLN: 50 MG/ML | INTRAMUSCULAR | Qty: 1

## 2022-11-03 MED FILL — PROPOFOL 200 MG/20ML IV EMUL: 200 MG/20ML | INTRAVENOUS | Qty: 60

## 2022-11-03 MED FILL — FENTANYL CITRATE (PF) 100 MCG/2ML IJ SOLN: 100 MCG/2ML | INTRAMUSCULAR | Qty: 2

## 2022-11-03 NOTE — Progress Notes (Signed)
Physical Therapy Evaluation  Patient: Barbara Elliott (71 y.o. female)                        DOB: 1951-01-02   Date: 11/03/2022   Physical Therapy Time: PTE: 23' , co-rx: 60'   PT Individual Minutes  Time In: 1532  Time Out: 1610  Minutes: 38   Primary Diagnosis: Primary osteoarthritis of left hip  Present on Admission:   Primary osteoarthritis of left hip   Procedure(s) (LRB):  HIP TOTAL ARTHROPLASTY ANTERIOR APPROACH (Left) Day of Surgery  Chief Reason for Therapy: (PT diagnosis):    Orthopedic;Total Hip  Requires PT Follow-Up: Yes  Restrictions/Precautions:   Restrictions/Precautions  Restrictions/Precautions: Weight Bearing Lower Extremity Weight Bearing Restrictions  Left Lower Extremity Weight Bearing: Weight Bearing As Tolerated  Position Activity Restriction  Other position/activity restrictions: protected wb with walker x 6-12 weeks  General Comments:   General  Chart Reviewed: Yes  Patient assessed for rehabilitation services?: Yes    Past Medical/Surgical History:   has a past medical history of Chronic back pain, Headache, High blood pressure, High cholesterol, Hypothyroidism, Osteoarthritis, Osteoporosis, Prediabetes, Primary localized osteoarthrosis, Primary osteoarthritis of right hip, Sciatica, and Wears glasses.  Past Surgical History:   Procedure Laterality Date    CHOLECYSTECTOMY  1980    HYSTERECTOMY, VAGINAL  2013    IR CHOLECYSTOSTOMY PERCUTANEOUS COMPLETE  1980    LUMBAR DISCECTOMY  1998    LUMBAR SPINE SURGERY Right 12/31/2021    Re-exploration Right L5-S1 with lateral recess decompression & diskectomy - Dr. Dorice Lamas    OTHER SURGICAL HISTORY  2013    pelvic floor, mesh and raise bladder with hysterectomy       History of Present Illness:   This 71 y/o female underwent L THA today.        Subjective   Subjective:    Subjective  Subjective: Pt stating that she feels ready to return home today.     Pain Screen:    Subjective  Pain: 5-6/10 at start of session while in bed. Pt report pain  decreased to 4/10 with activity and ambulation    Objective findings     PT findings:  (pulled from flowsheets) (blank = not applicable or not assessed)  Social Hx and Prior Functional status:  Social/Functional History  Lives With: Spouse  Type of Home: House  Home Layout: Two level;Able to Live on Main level with bedroom/bathroom;Bed/Bath upstairs  Home Access: Stairs to enter with rails  Entrance Stairs - Number of Steps: 6 STE with right-side raililng, 13 steps to bedroom/bathroom with full left-side hand rail and right hand rail for half the flight of stairs  Bathroom Shower/Tub: Tub/Shower unit;Walk-in shower  Bathroom Equipment: 3-in-1 commode;Shower chair;Grab bars in shower  Home Equipment: Cane;Walker, rolling;Grab bars;Reacher;Long-handled shoehorn;Sock aid  Has the patient had two or more falls in the past year or any fall with injury in the past year?: No  Receives Help From: Family  ADL Assistance: Independent  Homemaking Assistance: Independent  Ambulation Assistance: Independent  Transfer Assistance: Independent  Active Driver: Yes  Mode of Transportation: Car   Vision:     Auditory:       Vitals:      Orientation and Cognition:            Observations:         General Extremity Gross Assessment:   Gross Assessment  AROM: Generally decreased, functional (R LE and L ankle  AROM WFLs, L hip and knee AROM limited due to pain)  Strength: Generally decreased, functional (R LE and L ankle strength grossly at least 3/5 per mobility; L hip and knee strength grossly at least 2/5 per mobility)  Sensation: Intact                                                    Mobility Assessment   Bed Mobility:  Bed mobility  Supine to Sit: Stand by assistance   Transfers:  Transfers  Sit to Stand: Stand by assistance (vcs for HP and sequencing)  Stand to Sit: Stand by assistance   Ambulation:  Ambulation  Device: Conservation officer, nature  Assistance: Stand by assistance  Quality of Gait: Pt able to ambulate with step-to gait pattern  with RW, antalgic presentation noted, wide BOS  Gait Deviations: Increased BOS  Distance: 100'x2  Stairs/Curb  Stairs?: Yes  Stairs  # Steps : 3  Stairs Height: 4"  Rails: Right ascending  Device: Crutches (1 crutch, R HR)  Assistance: Contact guard assistance  Comment: PT recommending pt's spouse assist pt when climbing up/down steps at home.             Assessment:     Assessment:  Assessment: Pt seemed to tolerate tx well without c/o increased pain. Pt was able to transitions supine to/sit with SBA, sit to/from stand with SBA, ambulate with RW 100'x2 with step-to gait pattern. PT requiring cga when climbing up/down 2-3 steps with R HR and 1 crutch x 2-3 reps. PT recommending pt's spouse/family assist pt when OOB with RW, as well as assist pt when climbing up/down stairs. PT recommending home with HHPT and family support.  Treatment Diagnosis: impaired functional mobility  Therapy Prognosis: Good  Decision Making: Low Complexity  Discharge Recommendations: Home with Home health PT (and family support)        Equipment Recommendations for Discharge:  PT Equipment Recommendations  Equipment Needed: No  Other: pt has her own RW, crutches, cane, and commode    Factors which may influence rehabilitation potential include:  Chronicity of Problem, Prior impaired baseline level of function , Medical condition/comorbidities , Impaired Safety awareness, Pain tolerance/Management, and Home/Living situation and/or support systems    Discharge Recommendations:  _0  Based on therapist evaluation, Earlene Bjelland is mobilizing and functioning in a manner compatible with discharge to prior home setting (includes home, assisted living, LTC facility).     _1  For patient to succeed in the home setting, the following supports are recommended:   _2  Home services: HHPT   _3  Adaptive equipment/DME: RW, 1 crutch and 1 HR climbing up/down steps    _4  Supervision/assist during mobility    _5  Family/caregiver education   _6  Other:     _7   The following factors are limiting the patient from returning home at this time:   _8  Not yet medically optimized   _9  Pain control   _10  Unstable vital signs during mobility   _11  Gait instability/poor balance/inability to transfer     _12  Cognitive status affecting safety awareness   _13  Limited social supports to assist at home  _14  Architectural barriers in the home  _15  Need for frequent mobilization with floor staff to maximize potential        to discharge home  _16  Other:     _17   If patient needs are unable to be safely met in the home setting after team discussion, will recommend ongoing facility-based rehab (STR).       PT GOALS:     Patient will demonstrate home exercise program with independence.  While maintaining precautions patient will demonstrate bed mobility with independence.  While maintaining precautions, patient will move from supine to sit on bed with independence.  While maintaining precautions, patient will perform sit/stand transfers bed to/from chair with independence using walker.  While maintaining precautions, patient will ambulate 120 feet with modified independence with walker.  While maintaining precautions, patient will ambulate up/down 6 steps with right railing with minimal assistance/contact guard assist with crutches  While maintaining precautions, patient will be able to perform simulated car transfer with modified independence.      PLAN:   Physcial Therapy Plan  General Plan: 5-7 times per week  Therapy Duration:  (as inpt)  Current Treatment Recommendations: Strengthening;Functional mobility training;ROM;Balance training;Transfer training;Gait training;Stair training;Endurance training;Pain management;Return to work related activity;Home exercise program;Safety education & training;Equipment evaluation, education, Visual merchandiser;Therapeutic activities     Other Comments or communication with other disciplines:  nsg, OT     WAS TREATMENT INITIATED?  Yes IF YES CONTINUE TO TREATMENT  NOTE BELOW    Physical Therapy treatment on same day as initial evaluation    Objective:    Role of PT/POC/goals, safe transfers, bed mobility, gait/stair training, car transfers, HEP, cryotherapy precautions.  PT recommending pt ambulate with RW at all times with spouse/family assist. PT recommending pt have assist when climbing up/down stairs.        Education:   Patient Education  Education Given To: Patient;Family  Education Provided: Role of Therapy;Plan of Care;Transfer Training;Home Exercise Program;Precautions;Equipment;Family Education  Education Provided Comments: role of PT/POC/goals, safe transfers, gait/stair training, cryotherapy precautions,car transfers, HEP via written HO. PT recommending pt's spouse/family assist pt with OOB mobility with RW at all times. PT recommending that pt's spouse assist pt with climbing up/down steps at home.  Education Method: Demonstration;Verbal  Education Outcome: Verbalized understanding    Assessment:   Assessment  Discharge Recommendations: Home with Home health PT (and family support)  PT Equipment Recommendations  Equipment Needed: No  Other: pt has her own RW, crutches, cane, and commode      Therapist Signature: [place], PT, 11/03/2022    Total Time: 17'    Timed Minute Breakdown: PTE: 10'  Co-Tx: 50'   TE:   TA: 74'  GT:   [place] patient chart review/coordination of care:  74'

## 2022-11-03 NOTE — Progress Notes (Signed)
Updated Rx sent foroxycodone 10 mg tablets instead of 5 mgs,per insurance coverage

## 2022-11-03 NOTE — Other (Signed)
Patient ambulated to bathroom  with steady gait with use of walker  with supervision of two staff members. No dizziness, lightheadedness, or nausea noted. Patient able to return to bed at this time and IV discontinued. Patient agreeable to discharge at this time.

## 2022-11-03 NOTE — Discharge Summary (Signed)
Total Hip Discharge Summary      Patient ID:  Barbara Elliott  [id number]  71 y.o.  28-Dec-1951      Admit date: 11/03/2022    Discharge date and time: 11/03/22    Length of Stay: 0    Admitting Physician: Irena Reichmann, MD     Admission Diagnoses: Primary osteoarthritis of left hip [M16.12]    Discharge Diagnoses:     Surgeon: Dr. Namon Cirri     Principal Procedure: Admission Date - Date of Surgery    Procedure(s):  HIP TOTAL ARTHROPLASTY ANTERIOR APPROACH    Surgeon(s):  Irena Reichmann, MD  -------------------    Code Status: Full Code    Allergies:   Allergies   Allergen Reactions    Codeine Shortness Of Breath    Lisinopril Cough       Diet: Diet NPO Exceptions are: Sips of Water with Meds  Encourage high protein foods  Frequent small meals  Drink enough fluids to make urine light yellow    Discharge Medications:      Medication List        CHANGE how you take these medications      acetaminophen 325 MG tablet  Commonly known as: TYLENOL  Take 2 tablets by mouth every 6 hours  What changed: Another medication with the same name was removed. Continue taking this medication, and follow the directions you see here.     amLODIPine 10 MG tablet  Commonly known as: NORVASC  TAKE 1 TABLET BY MOUTH EVERY DAY  What changed: when to take this     atorvastatin 40 MG tablet  Commonly known as: LIPITOR  TAKE 1 TABLET BY MOUTH EVERY DAY  What changed: when to take this     metoprolol succinate 25 MG extended release tablet  Commonly known as: TOPROL XL  TAKE 1 TABLET BY MOUTH EVERY DAY FOR BLOOD PRESSURE  What changed: additional instructions            CONTINUE taking these medications      aspirin 81 MG EC tablet  Take 1 tablet by mouth in the morning and at bedtime     calcium carbonate 1500 (600 Ca) MG Tabs tablet     cefadroxil 500 MG capsule  Commonly known as: DURICEF  Take 1 capsule by mouth 2 times daily for 7 days     Cholecalciferol 50 MCG (2000 UT) Tabs     glucosamine-chondroitin 500-400 MG Caps      levothyroxine 25 MCG tablet  Commonly known as: SYNTHROID  Take 1 tablet by mouth daily     meloxicam 7.5 MG tablet  Commonly known as: MOBIC  Take 1 tablet by mouth daily     MULTIVITAMIN ADULT PO     nitrofurantoin (macrocrystal-monohydrate) 100 MG capsule  Commonly known as: MACROBID  Take 1 capsule by mouth 2 times daily for 7 days     ondansetron 4 MG tablet  Commonly known as: ZOFRAN  Take 1 tablet by mouth every 8 hours as needed for Nausea or Vomiting     oxyCODONE 5 MG immediate release tablet  Commonly known as: Roxicodone  Take 1-2 tablets by mouth every 6 hours as needed for Pain (acute post op pain) for up to 7 days. Max Daily Amount: 40 mg     pantoprazole 40 MG tablet  Commonly known as: PROTONIX  Take 1 tablet by mouth daily     sennosides-docusate sodium 8.6-50 MG tablet  Commonly  known as: SENOKOT-S  Take 1 tablet by mouth daily            STOP taking these medications      ibuprofen 200 MG tablet  Commonly known as: ADVIL;MOTRIN              Weight Bearing Status: full weight bearing as tolerated    Preoperative Medical Clearance: : Barbara Elliott is a 71 y.o. female who had progressive, life-limiting left hip pain and failed conservative treatment. The patient decided to proceed with left total hip arthroplasty. A more detail history and physical is available in the chart.     Hospital Course: The left total hip arthroplasty was preformed uneventfully on the date of admission as per the operative summary. Ancef was used for antibiotic prophylaxis. Aspirin, thigh high TED hose, SCDs and early mobilization was used for DVT prophylaxis. The patient is doing well enough with physical therapy to be discharged home. The patient is discharged home with pain medication, wound care instructions, DVT prophylaxis and a follow up visit in our clinic in 3 weeks.    Follow-up Labs: none    Wound Care Instructions: PICO dressing will be left in place for only 14 days, unless soiled or loose. If removed and  staples (sutures) are present, place a dry sterile dressing over the wound.     Sutures or staples are to be removed in your surgeon's office, by a skilled nursing facility nurse or by the [place] nurse 14 days post-op.      Signed:  Redmond Pulling, APRN - NP      Home Care Face-to-Face Encounter    Patient's Name: Barbara Elliott    Date of Birth: 03/14/1951  Date of Admission: 11/03/2022  Date of Discharge:     Primary Diagnosis: Primary osteoarthritis of left hip [M16.12]    Date of Face to Face:  11/03/2022                                  Face-to-Face encounter is related to primary reason for home care:   Yes.    I certify that this patient is under my care and has a Face to Face Encounter related to the primary reason for home health.    1. I certify that the patient needs intermittent skilled nursing care, physical therapy and/or speech therapy.  I will not be following this patient in the community. The Edmonia Lynch, APRN - NP  will be responsible for signing any ongoing the [place].    2. Initial Orders for Care: Must be completed only if Face to Face MD will not be signing the [place].    3. I certify that this patient is homebound for the following reason(s): Requires considerable and taxing effort to leave the home  Patient currently under activity restrictions secondary to recent surgical procedure, this hinders their ability to safely leave the home.    4. I certify that this patient is under my care and that I, or a nurse practitioner or physician's assistant working with me, had a Face-to-Face Encounter that meets the physician Face-to-Face Encounter requirements.    Document the physical findings from the Face-to-Face Encounter that support the need for skilled services: Recent surgical procedure that requires wound assessment, teaching, lab monitoring and physical, occupational and/or speech therapy services for evaluation and interventions.  Also see Discharge summary    Based on the findings, I certify that this patient is confined to the home and needs physical therapy and occupational therapy.      Ranell Patrick, APRN - NP  11/03/2022

## 2022-11-03 NOTE — Anesthesia Post-Procedure Evaluation (Signed)
Department of Anesthesiology  Postprocedure Note    Patient: Barbara Elliott  MRN: [id number]  Birthdate: [birth year]  Date of evaluation: 11/03/2022      Procedure Summary     Date: 11/03/22 Room / Location: [facility] 02 / [facility] OR    Anesthesia Start: 2751 Anesthesia Stop: 7001    Procedure: HIP TOTAL ARTHROPLASTY ANTERIOR APPROACH (Left: Hip) Diagnosis:       Primary osteoarthritis of left hip      (Primary osteoarthritis of left hip [M16.12])    Surgeons: Raliegh Ip, MD Responsible Provider: Deanne Coffer, MD    Anesthesia Type: Spinal ASA Status: 2          Anesthesia Type: Spinal    Aldrete Phase I: Aldrete Score: 10    Aldrete Phase II:        Anesthesia Post Evaluation    Patient location during evaluation: PACU  Patient participation: complete - patient cannot participate  Level of consciousness: responsive to verbal stimuli and sleepy but conscious  Airway patency: patent  Nausea & Vomiting: no nausea and no vomiting  Complications: no  Cardiovascular status: hemodynamically stable  Respiratory status: acceptable  Hydration status: euvolemic  Comments: Normothermic:  yes -   Multimodal analgesia pain management approach  Pain management: adequate

## 2022-11-03 NOTE — Progress Notes (Signed)
Occupational Therapy Evaluation and Discharge Same Visit.   Patient: Barbara Elliott (71 y.o. female)           DOB: 1951-09-24   Date: 11/03/2022   Occupational Therapy Time:    OT Individual Minutes  Time In: 1096  Time Out: 1609  Minutes: 39    15 mins billable co treat for OT     Primary Diagnosis: Primary osteoarthritis of left hip  Present on Admission:   Primary osteoarthritis of left hip   Procedure(s) (LRB):  HIP TOTAL ARTHROPLASTY ANTERIOR APPROACH (Left) Day of Surgery  Chief Reason for Therapy: (OT diagnosis):    Chief Reason: Total Hip       Restrictions/Precautions:  Restrictions/Precautions  Restrictions/Precautions: Weight Bearing  Lower Extremity Weight Bearing Restrictions  Left Lower Extremity Weight Bearing: Weight Bearing As Tolerated    General Comments:   General  Patient assessed for rehabilitation services?: Yes    Past Medical/Surgical History:   has a past medical history of Chronic back pain, Headache, High blood pressure, High cholesterol, Hypothyroidism, Osteoarthritis, Osteoporosis, Prediabetes, Primary localized osteoarthrosis, Primary osteoarthritis of right hip, Sciatica, and Wears glasses.  Past Surgical History:   Procedure Laterality Date    CHOLECYSTECTOMY  1980    HYSTERECTOMY, VAGINAL  2013    IR CHOLECYSTOSTOMY PERCUTANEOUS COMPLETE  1980    LUMBAR DISCECTOMY  1998    LUMBAR SPINE SURGERY Right 12/31/2021    Re-exploration Right L5-S1 with lateral recess decompression & diskectomy - Dr. Dorice Lamas    OTHER SURGICAL HISTORY  2013    pelvic floor, mesh and raise bladder with hysterectomy         History of Present Illness:   L THA       Subjective   Subjective: pt pleasant and agreeable to participation       Pain Screen:    Subjective  Pain: 5-6/10 at start of session while in bed. Pt report pain decreased to 4/10 with activity and ambulation    Barriers to Learning/Limitations: none    Prior Level of Function comments:  ADL functional status: Independent   Patient needs help  with: Patient does total self care    Objective findings   OT findings:  (pulled from flowsheets) (blank = not applicable or not assessed)  Social Hx and Prior Functional status:    Social/Functional History  Lives With: Spouse  Type of Home: House  Home Layout: Two level;Able to Live on Main level with bedroom/bathroom;Bed/Bath upstairs  Home Access: Stairs to enter with rails  Entrance Stairs - Number of Steps: 6 STE with right-side raililng, 13 steps to bedroom/bathroom with full left-side hand rail and right hand rail for half the flight of stairs  Bathroom Shower/Tub: Tub/Shower unit;Walk-in shower  Bathroom Equipment: 3-in-1 commode;Shower chair;Grab bars in shower  Home Equipment: Cane;Walker, rolling;Grab bars;Reacher;Long-handled shoehorn;Sock aid  Has the patient had two or more falls in the past year or any fall with injury in the past year?: No  Receives Help From: Family  ADL Assistance: Independent  Homemaking Assistance: Independent  Ambulation Assistance: Independent  Transfer Assistance: Independent  Active Driver: Yes  Mode of Transportation: Musician  Occupation: Retired   Vision:  Vision  Vision: Within Secretary/administrator:  Hearing: Within functional limits    Vitals:        Orientation and Cognition:   Orientation  Overall Orientation Status: Within Functional Limits         Perception:  Observations:         General Extremity Gross Assessment:         Detailed Extremity Assessment: (IF Empty = not applicable or not assessed)   Range of Motion:   Right Upper Extremity ROM (degrees) Left Upper Extremity ROM (degrees)                     U.E Strength:   Right Upper Extremity Strength Left Upper Extremity Strength         Hand Strength/Assessment:      Right Hand Strength Left Hand Strength         Additional Measures:      Sensation Detailed:    Quality of Movement: including tone, coordination                ADL's and Mobility Assessment   ADL's:   ADL  UE Dressing: Independent  LE  Dressing: Stand by assistance;Verbal cueing  LE Dressing Skilled Clinical Factors: cues for pacing and safety   Bed Mobility:      Transfers:      Balance:          Assessment:    Based on the objective data in flowsheets, the patient presents with the following:  Performance deficits / Impairments: Decreased functional mobility ;Decreased ADL status;Decreased ROM;Decreased strength;Decreased endurance;Decreased balance  Assessment: pt benefit from cues for pacing and safety t/o eval and receptive. Pt educated on ADL adaptive dressing and use of AD and pt verbalize understanding and had purchased a hip kit for home. PT bed/bath on second floor but pt can stay on main level if needed. Pt will benefit from [facility]  Treatment Diagnosis: impaired self-care  Prognosis: Good  Decision Making: Low Complexity  Discharge Recommendations: Home with Home health OT;24 hour supervision or assist  Evaluation Activity Tolerance:   Activity Tolerance  Activity Tolerance: Patient Tolerated treatment well    OT Discharged due to:  no skilled inpatient needs, pt d/c home  Therapy goals met: met   Patient is baseline prior level of functioning: No   Discharge Placement Recommendations:  Home with home health PT  Equipment Recommendations for Discharge:           OT GOALS:     Patient will demonstrate adaptive techniques for LB dressing with minimal cues  Patient will verbalize understanding of AE options for LB dressing  Patient will verbalize understanding of AE/AD options for bathroom including RTS and tub seat/transfer bench      OT PLAN:         Other Comments or communication with other disciplines:  RN, PT    WAS TREATMENT INITIATED?  Yes IF YES CONTINUE TO TREATMENT NOTE BELOW    Occupational Therapy Treatment on same day as evaluation    Objective:      Functional Mobility Training:   Bed Mobility Training  Bed Mobility Training: Yes  Supine to Sit: Supervision  Transfer Training  Transfer Training: Yes  Sit to Stand: Stand-by  assistance  Stand to Sit: Stand-by assistance  Bed to Chair: Stand-by assistance  Balance  Sitting: Intact  Standing: With support  Gait  Overall Level of Assistance: Stand-by assistance  Distance (ft): 50 Feet  Assistive Device: Walker, rolling;Gait belt    Neuromuscular Education:       ADL's:   ADL  UE Dressing: Independent  LE Dressing: Stand by assistance;Verbal cueing  LE Dressing Skilled Clinical Factors: cues for pacing and safety  OT Exercises:        Other Treatments/Modalities:       Education:  Patient Education  Education Given To: Patient  Education Provided: Role of Therapy;Plan of Care;Home Exercise Program;Precautions;Equipment;Transfer Training;Energy Conservation;ADL Adaptive Strategies  Education Method: Demonstration;Verbal  Barriers to Learning: None  Education Outcome: Verbalized understanding;Demonstrated understanding;Continued education needed    Assessment:     Assessment  Discharge Recommendations: Home with Home health OT;24 hour supervision or assist      Therapist Signature: Unknown Jim, OT, 11/03/2022   Total Time: 39   Timed Minute Breakdown:   OTE: 10  Co-treat: 29    ADL/self care: 15     TE:      TA:     NMRE      New patient chart review/coordination of care:  5 min

## 2022-11-03 NOTE — Anesthesia Procedure Notes (Signed)
Spinal Block    Patient location during procedure: pre-op  End time: 11/03/2022 11:47 AM  Reason for block: primary anesthetic  Staffing  Performed: resident/CRNA   Resident/CRNA: Shery Wauneka, Burnett Sheng, APRN - CRNA  Performed by: Sandi Raveling, APRN - CRNA  Authorized by: Deanne Coffer, MD    Spinal Block  Patient position: sitting  Prep: ChloraPrep  Patient monitoring: cardiac monitor, continuous pulse ox, continuous capnometry and frequent blood pressure checks  Approach: midline  Location: L3/L4  Provider prep: sterile gloves and mask  Local infiltration: lidocaine  Needle  Needle type: Pencan   Needle gauge: 25 G  Needle length: 3.5 in  Assessment  Swirl obtained: Yes  CSF: clear  Attempts: 1  Hemodynamics: stable  Preanesthetic Checklist  Completed: patient identified, IV checked, site marked, risks and benefits discussed, surgical/procedural consents, equipment checked, pre-op evaluation, timeout performed, anesthesia consent given, oxygen available, monitors applied/VS acknowledged, fire risk safety assessment completed and verbalized and blood product R/B/A discussed and consented

## 2022-11-03 NOTE — Care Coordination-Inpatient (Signed)
HHS was prearranged by MD office prior to admit. Phone update to [facility].

## 2022-11-03 NOTE — Op Note (Signed)
Total Hip Arthroplasty Operative Report    Indications: This is a 64 yrs female who presents with degenerative joint disease of the left hip. She was positive for daily lifestyle limiting pain, loss of motion and difficulty with activities of daily living.  The patient was admitted for surgery as conservative measures have failed.  Patient was advised of the risks, benefits and alternatives of the surgery in layman's terms to include but not limited to:  Infection, limb length inequality, bleeding, nerve damage, continued pain, repeat surgery, iatrogenic injury to nerves, muscles, ligaments, tendons and bones and medical complications to include Cardiac, Pulmonary, GI, Renal, DVT/PE and anesthesia risks to include death.  Patient freely signed the surgical consent after soliciting any additional questions.      Date of Surgery: 11/03/2022     Preoperative Diagnosis:  Primary osteoarthritis of left hip [M16.12]    Postoperative Diagnosis: M16.12    Surgeon: Irena Reichmann, MD    Assistant:  Dallas Breeding FNP. Additional assistance was required throughout the entire case for soft tissue retraction, electrocautery, implant placement and wound closure.     Anesthesia: Spinal    Procedure: L THA CPT 27130    Capsular Management: Partial anterior capsulectomy    Operation in Detail:   The patient was identified in the preoperative holding area.  The operative hip was confirmed with the patient and marked with indelible ink.  A spinal was performed by Anesthesia, and the patient was taken to the operating room.  The patient was kept supine on the orthopedic table.  A well-padded perineal post was placed.  Compression stockings were applied, followed by SCDs to bilateral calves. The feet were placed in well-padded traction boots and then attached to the leg spars. The operative hip was prepped and draped in sterile fashion. Per protocol, the following were performed prior to incision: a surgical time-out and side  confirmation, site marking, administration of antibiotics confirmed within 1 hour of incision, TXA administration, and implant sizes and trays reviewed and located in the OR suite.     A direct anterior approach to the hip was undertaken.  An incision was made over the anterior aspect of the hip, lateral to the ASIS.  The tensor fascia was exposed, and then incised in line with the skin incision. The tensor muscle belly was mobilized from its fascia and retracted laterally.  This exposed the ascending branch of the lateral femoral circumflex vessels.  These vessels were coagulated.  Retractors were placed around the anterior hip capsule.  The anterior capsule was divided and excised.  Retractors were placed around the femoral neck, and the femoral neck was then cut according to preoperative plan. The arthritic femoral head was removed from the acetabulum with a corkscrew. Retractors were placed around the acetabulum.  A circumferential labrectomy was performed. The contents of the cotyloid fossa were resected.  The true floor of the acetabulum was visualized.  A small reamer was used to medialize down to the true floor.  Sequential reaming was performed until there was a bed of bleeding cancellous bone.  The acetabulum was reamed line-to-line, up to 52 mm.  Preservation of bone stock was maximized.  The cup was then was impacted into the acetabulum. The cup was positioned under fluoroscopic guidance in approximately 40 degrees of abduction and 15 degrees of anteversion.  Once seated, acetabular component was noted to be parallel to the TAL.  The acetabular liner was implanted into the cup making sure the locking mechanism  was fully engaged.     A femoral elevating hook was placed behind the femur proximal to the gluteal sling.  The femur was exposed through a combination of extension, adduction, and external rotation of the operative leg.  After release of the lateral hip capsule from the base of the trochanter, the  femur mobilized nicely.  There was safe access to the femoral canal for broaching.  The femur was broached sequentially until axial and rotational stability occurred at the templated neck resection level. This stability was afforded with a size 4 broach, which was the templated size. There were no fractures.  The joint was reduced with a trial neck and a trial head ball.  The joint was visualized with the fluoroscope. Adjustments were made with the trial neck and trial head ball to optimize both offset and leg length, and the ideal offset was felt to be high and head ball length -3. With extension to approximately 30 degrees, external rotation to 80 degrees did not result in any femoral head subluxation.  The hip was then dislocated and the femur was re-exposed.  The broach was removed from the canal and the real stem was placed into the broach envelope.  The stem was impacted until axial and rotational stability were achieved. The femoral stem sat slightly proud relative to the broach. There were no fractures visualized around the femoral neck. I tried to tap the stem back out a few centimeters proximally, then provided further taps antegrade to sink this further to the level that the broach had sat at, to prevent overly lengthening the left hip. With the final blow of the mallet, a vertical crack in the calcar appeared. The extent of this was visualized, and it measured approximately 4 cm. The stem was backed out a few millmeters, and the Dall-Miles tray was opened. A cable passer was used to bring one cable proximal to the lesser trochanter, followed by another one distal. The cable passer maintained bony contact as it passed around circumferentially, and fluoroscopy was used to confirm that the cable came fully opposed to bone biplanarly. The proximal cable was tightened and the fracture line diastasis disappeared as this reduced. The distal cable was tightened appropriately. Fluoroscopy was used to confirm good  cable position, then the piton was crimped and cables were cut flush with the piton. The stem was malleted to the level of where the broach had previously sat. The stem was axially and rotationally stable. I elected to proceed with placement of the 36-3 headball.  The trunnion of the stem was cleaned and dried.  The actual 36-3 head ball was placed onto the trunnion of the stem.  The [facility] taper was engaged with a sharp blow of the mallet.  The hip was reduced, making sure there was no debris incarcerated within the articulation.  The hip was stable without impingement with range of motion testing.  The wound was irrigated copiously with dilute betadine followed by sterile saline.  Fluoroscopy was used to confirm that the hip was fully reduced without any visible fractures. The periarticular tissues were infiltrated with local anesthetic.  1 gram of topical Vancomycin was used throughout the wound. The tensor fascia was closed with interrupted and running #1 Vicryl.  The subcutaneous layer was closed with 2-0 Vicryl.   The skin was closed with staples.   Sterile dressings were applied, including a negative pressure dressing.  All sponge and needle counts were correct.  The patient was awakened from anesthesia, and  taken to recovery room without apparent intraoperative complication.     Postop Plan: Patient will receive postoperative radiographs in the PACU. Patient will be weight bearing as tolerated without hip precautions. I would like for her to protect her weightbearing with a walker for 6-12 weeks. VTE prophylaxis has been ordered and they will receive ASA beginning on the morning of postoperative day 1; this is to be supplemented with the use of compression stockings, and early mobilization.  Perioperative antibiotics will be in the form of ancef for up to 24 hrs while they remain in house; followed by extended oral antibiotic regimen. Marland Kitchen  Postop pain control will be in the form of APAP, NSAID, narcotics.   Patient will advance on the THA pathway, and work with therapies and discharge home once pain is controlled and cleared by therapies. Home health physical therapy will be arranged for the patient. Follow up in clinic in 2-3 weeks for wound evaluation and staple removal. Plan for repeat XR at 6 weeks and one year postoperatively, unless there is significant pain.    Findings:  Degenerative joint changes    Estimated Blood Loss:  200 cc           Drains: None           Specimens: * No specimens in log *      Counts: correct    Implants: S&N R3 52, neutral liner, [employer] 4HO, oxinium 36-3, Dall-Miles 2.47mm cables x2    Complications:  calcar crack during stem implantation, treated with x2 cerclage cables

## 2022-11-03 NOTE — Interval H&P Note (Signed)
Interval Update History & Physical    History & Physical attached to this interval note was reviewed with the patient and the patient indicates there are no changes since seen.   I re-examined  and evaluated the patient at time of pre-op admission.      The surgical site was confirmed by the patient and signed by me. Laterality: Left    Patient found appropriate for the planned procedure and planned medications: Yes    Plan:  The risk, benefits, expected outcome, and alternative to the recommended procedure have been re-reviewd with the patient.  Patient understands and wants to proceed with the procedure.    Electronically signed by Raliegh Ip, MD on 11/03/2022 at 11:10 AM

## 2022-11-04 NOTE — Telephone Encounter (Signed)
Noted.

## 2022-11-04 NOTE — Telephone Encounter (Signed)
I called and spoke with Barbara Elliott . Patient reports doing well at home. Ambulating within the home. Pain is well controlled. Denies nausea or vomiting and has tolerated food and liquid. Dressing is CDI, no questions or concerns. Patient denies difficulty or concerns with picking up prescription medications.   Patient understands to call the office with any questions or concerns.     She did have a question about the calcar fracture. I explained what happened as outlined in the note. I reassured her that this was stable with the use of the Dall-Miles cables around the fracture site. She should protect her weight bearing with the use of a walker for 6-12 weeks, and should not advance to a cane any sooner than 6 weeks.

## 2022-11-04 NOTE — Telephone Encounter (Signed)
Chris from [facility] calling to say patient was opened to home health PT today and everything looks good.   They do see it says 14 days out for staples, and since patient doesn't have an appt with [place] until 11/29, they will take care of that.          CB if needed [phone]      Amy L Violette

## 2022-11-05 NOTE — Telephone Encounter (Signed)
[facility] called in stating they would like verbal orders for 4 more visits with patient for PT. C/B if needed is [phone]    Thank you,    Herbert Pun.

## 2022-11-05 NOTE — Telephone Encounter (Signed)
I called and left a message, okay for 4 more visits.

## 2022-11-06 ENCOUNTER — Encounter

## 2022-11-13 NOTE — Telephone Encounter (Signed)
I called and spoke to [place]. She has a rash on the medial side of her thigh. It is itchy. Her ted stockings are thigh highs.     I asked that she discontinue her ted stockings to remove a possible cause of the irritation. She should continue to ambulate, do exercises, and take her aspirin. She will call us if there is an increase in the rash size. She will apply topical OTC steroid cream to the rash during the day and take diphenhydramine before bed to reduce the rash and itching symptoms. I called and spoke with Glenard Haring at [facility]. I updated her on the POC.

## 2022-11-13 NOTE — Telephone Encounter (Signed)
Angel with [facility] calling stating patient has a new rash on the left leg (surgical side) which started 3 days ago, coincidentally the same day she ended her antibiotics.  It's raised and itchy.  She's washing her ted stockings, it's on the medial side.       CB [phone]      Amy L Violette

## 2022-11-25 ENCOUNTER — Encounter: Payer: MEDICARE | Primary: Family

## 2022-11-25 ENCOUNTER — Ambulatory Visit: Admit: 2022-11-25 | Discharge: 2022-11-25 | Payer: MEDICARE | Primary: Family

## 2022-11-25 DIAGNOSIS — Z96642 Presence of left artificial hip joint: Secondary | ICD-10-CM

## 2022-11-25 NOTE — Progress Notes (Signed)
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 The postoperative course has been uncomplicated. The surgical wound has been healing appropriately. There has been no drainage nor other local signs of infection. Although there is still pain from surgery, the patient reports that hip pain and function are improved over the preoperative state. They continue to take aspirin as prescribed for DVT prophylaxis. Patient has finished home health with [facility] and is now at outpatient PT with Colton Drown. The patient is ambulating with the assistance of a single point cane The patient is pleased with the early outcome.    ROS:   The patient denies fevers, chills or other local or systemic signs of prosthetic joint infection.    Exam:  The patient is awake, alert and appropriate.  The head is normocephalic.  The sclerae are white.  The skin has normal turgor.  The abdomen is non-protruded.  The chest expands normally with deep inspiration.  The cardiac rhythm is palpable at the wrist.    Focused examination of the  left  hip reveals a well-healing surgical incision over the anterior aspect of the hip. There is no surrounding erythema or vasomotor disturbance. The trochanter is nontender to palpation. There is no defect in the abductor mechanism. In the seated position rotational motion of the hip is painless. There is good hip flexor and abductor strength in the seated position. Patient is taking tylenol for pain. The calf is soft and supple.      Assessment:   71 y.o. 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 We have discussed further recovery milestones. The patient may engage in low impact activities as tolerated. Infection  prophylaxis is reviewed. She will be traveling to [place] next month by car. She will make frequent hourly stops to ambulate and will take aspirin 81mg  BID for 2 weeks before her trip, during her trip, and 2 weeks following her return to [place]. Original plan was for a walker for 6-12 weeks due to the calcar crack, however she was advanced to a single point cane by home therapy. This will be reviewed with Dr. Cheryle Horsfall. The patient will return to see [place] in three weeks.    Results of today's exam reviewed with Dr. Cheryle Horsfall.

## 2022-11-25 NOTE — Progress Notes (Signed)
Provider Number [id number]  Physical Therapy Initial Evaluation  Patient: Barbara Elliott (71 y.o. 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]  Treatment Diagnosis: Treatment Diagnosis: L hip mobility and muscle power deficits s/p THA  Problem Onset date:11/03/22     Medical History   Patient Assessed for Rehabilitation Services: Yes     Chart Reviewed: Yes    Medical History:   Past Medical History:   Diagnosis Date    Chronic back pain 05/2021    sciatic    Headache     High blood pressure     High cholesterol     Hypothyroidism     states hx of it but has resolved, TSH on 03/25/21 was 3.970    Osteoarthritis 2022    Osteoporosis 2020    Prediabetes 01/30/2021    Primary localized osteoarthrosis 09/04/2021    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 Marcille Buffy    OTHER SURGICAL HISTORY  2013    pelvic floor, mesh and raise bladder with hysterectomy    TOTAL HIP ARTHROPLASTY Left 11/03/2022    HIP TOTAL ARTHROPLASTY ANTERIOR APPROACH performed by Irena Reichmann, MD at [facility] MA[facility] OR       Medication:    Current Outpatient Medications:      acetaminophen (TYLENOL) 325 MG tablet, Take 2 tablets by mouth every 6 hours, Disp: 56 tablet, Rfl: 2    aspirin 81 MG EC tablet, Take 1 tablet by mouth in the morning and at bedtime, Disp: 70 tablet, Rfl: 0    meloxicam (MOBIC) 7.5 MG tablet, Take 1 tablet by mouth daily, Disp: 30 tablet, Rfl: 0    ondansetron (ZOFRAN) 4 MG tablet, Take 1 tablet by mouth every 8 hours as needed for Nausea or Vomiting, Disp: 21 tablet, Rfl: 0    pantoprazole (PROTONIX) 40 MG tablet, Take 1 tablet by mouth daily, Disp: 30 tablet, Rfl: 0    sennosides-docusate sodium (SENOKOT-S) 8.6-50 MG tablet, Take 1 tablet by mouth daily, Disp: 30 tablet, Rfl: 0    atorvastatin (LIPITOR) 40 MG tablet, TAKE 1 TABLET BY MOUTH EVERY DAY (Patient taking differently: Take 1 tablet by mouth every evening), Disp: 90 tablet, Rfl: 3    metoprolol succinate (TOPROL XL) 25 MG extended release tablet, TAKE 1 TABLET BY MOUTH EVERY DAY FOR BLOOD PRESSURE (Patient taking differently: Take 1 tablet by mouth daily), Disp: 90 tablet, Rfl: 3    levothyroxine (SYNTHROID) 25 MCG tablet, Take 1 tablet by mouth  daily, Disp: 90 tablet, Rfl: 1    amLODIPine (NORVASC) 10 MG tablet, TAKE 1 TABLET BY MOUTH EVERY DAY (Patient taking differently: Take 1 tablet by mouth every evening), Disp: 90 tablet, Rfl: 3    Multiple Vitamin (MULTIVITAMIN ADULT PO), Take 1 tablet by mouth daily, Disp: , Rfl:     calcium carbonate 1500 (600 Ca) MG TABS tablet, Take 1 tablet by mouth 2 times daily, Disp: , Rfl:     Cholecalciferol 50 MCG (2000 UT) TABS, Take 1 tablet by mouth daily, Disp: , Rfl:     glucosamine-chondroitin 500-400 MG CAPS, Take 1 capsule by mouth daily, Disp: , Rfl:   Allergies: Codeine and Lisinopril    SUBJECTIVE EXAMINATION   History obtained from:: Patient, Chart Review,      ,       Subjective History of Present Problem/injury:   Subjective: 3 weeks s/p L THA, went home same day, then home therapy 2-3x/ week     Additional Pertinent Hx:(if applicable):     Diagnostic  Imaging/Tests:        Previous PT/Other Services: Home PT, Surgery    Condition Since Onset:   gradually improving  Other Comments:      Patient Reported Goals:   travel by christmas   Pain Screening:   Pain Screening  Patient Currently in Pain: Yes  Pain Assessment: 0-10  Pain Level: 0  Best Pain Level: 0 (tylenol,)  Worst Pain Level: 3 (sitting too long when first get up)  Pain Type: Surgical pain  Pain Location: Hip  Pain Orientation: Left  Pain Descriptors: Aching     Functional Status:      Social Hx / Living situation:   Social History  Lives With: Spouse  Type of Home: House  Home Layout: Two level, Bed/Bath upstairs    Learning/Language: Learning  Does the patient/guardian have any barriers to learning?: No barriers  Will there be a co-learner?: No  What is the preferred language of the patient/guardian?: English  Is an interpreter required?: No      Occupation/Interests:   Occupation: Retired    Work Restrictions: NA      Prior Level of Function:     mod I        Current Level of Function:   mod indep                   OBJECTIVE EXAMINATION:     Hip Exam   Restrictions/Precautions:   Restrictions/Precautions: Surgical protocol, Other (comment) (osteoporosis)     Position Activity Restriction  Hip Precautions: Anterior hip precautions  Pt reporting at end of eval was told to use walker 6 weeks due to fracture during surgery - presents today with SPC instructed by home PT to dc walker 1 week post op - edu to patient to clarify at ortho follow up today     Systems Review  (as applicable)   Vision/Hearing:     Orientation and Affect:         Integumentary system:  Integumentary Assessment  Skin Integrity : Surgical Incision Dry (minor scabbing present) Vitals:        Ambulation/Gait (if applicable):   Ambulation  Surface: Level tile  Device: Single point cane  Assistance: Independent  Quality of Gait: right lateral trunk lean    Balance Screen:   Balance  Single Stance L Leg: unable    Palpation:   General  Palpation: (+) mild TTP L hip grossly  Left PROM  Right PROM      General PROM LE: PROM Assessed  PROM LLE (degrees)  L Hip Flexion (0-125): 90  L Hip Extension (0-10): 0  L Hip ABduction (0-45): 35  L Hip External Rotation (0-45): 20  L Hip Internal Rotation (0-45): 0 General PROM LE: PROM Assessed        Left Strength  Right Strength         Strength LLE  L Hip Flexion: 4/5  L Hip Extension: 4-/5  L Hip ABduction: 3-/5  L Hip External Rotation: 3-/5  L Knee Flexion: 4+/5  L Knee Extension: 4/5          Muscle Length/Flexibility: Muscle Length LE  General Muscle Length - LE: Length assessed  Right Psoas / Iliacus: Tight  Right Hip Adductors: Tight  Right Hamstrings: Tight      Special Tests (if applicable):   Special Tests for Hip  Hip Special Tests Performed: Performed  Piriformis Tightness: L (+)    Functional Outcome Measures/questionnaire(s) completed:               Lower Extremity Functional Scale (LEFS)  Any of your usual work, housework, or school activities: Moderate Difficulty  Your usual hobbies, recreational, or sporting activities: Moderate Difficulty  Getting into or out of the bath: No Difficulty  Walking between rooms: A Little Bit of Difficulty  Putting on your shoes or socks: A Little Bit of Difficulty  Squatting: Moderate Difficulty  Lifting an object, like a bag of groceries from the floor: Moderate Difficulty  Performing light activities around your home: A Little Bit of Difficulty  Performing heavy activities around your home: Moderate Difficulty  Getting into or out of a car: A Little Bit of Difficulty  Walking 2 blocks: Extreme Difficulty or Unable to Perform Activity  Walking a mile: Extreme Difficulty or Unable to Perform Activity  Going up or down 10 stairs (about 1 flight of stairs): A Little Bit of Difficulty  Standing for 1 hour: Quite a Bit of Difficulty  Sitting for 1 hour: No Difficulty  Running on even ground : Extreme Difficulty or Unable to Perform Activity  Running on  uneven ground : Extreme Difficulty or Unable to Perform Activity  Making sharp turns while running fast : Extreme Difficulty or Unable to Perform Activity  Hopping: Extreme Difficulty or Unable to Perform Activity  Rolling over in bed: A Little Bit of Difficulty  LEFS Total Score: 37  LEFS Disability Index: 40-59%  LEFS CMS Modifier: CK     Current Total Score: 37 / 80 (Date: 11/25/2022)    Interpretation of Score: 20 questions each scored on a 5-point scale with 0 representing "extreme difficulty or unable to perform" and 4 representing "no difficulty".  The lower the score, the greater the functional disability. 80/80 represents no disability.  Minimal detectable change is 9 points.       ASSESSMENT     Impression: Based on today's findings Kita Neace (71 y.o. female) presents 3 weeks post op L THA with decreased ROM, decreased strength and decreased balance limiting upright functional activity.        Performance Deficits/impairment List:           Body Structures, Functions, Activity Limitations Requiring Skilled Therapeutic Intervention: Decreased functional mobility , Decreased ADL status, Decreased ROM, Decreased strength, Decreased endurance, Decreased balance, Increased pain    This Patient will benefit from skilled physical therapy services to address the noted physical impairments,  functional limitations, and to increase the likelihood of meeting the related goals stated below.        Patient's Activity Tolerance: Patient tolerated evaluation without incident, Patient tolerated treatment well      Patient's rehabilitation potential/prognosis is considered to be: Good  Factors which may impact rehabilitation potential include: Medical co-morbidities  Measures taken to address barrier(s): N/A     Eval Complexity: Overall Evaluation : Medium  Decision Making: Medium Complexity  History: Personal Factors and/or Comorbidities Impacting POC: Medium  Examination of body system(s) including body structures and  functions, activity limitations, and/or participation restrictions: Medium  Clinical Presentation: Medium  Clinical Decision Making : Medium Complexity    Short Term Goals:  by 6 weeks  Pt. Will be indep and compliant with HEP for optimal post op outcome  Pt. To tol progressive stretching , strengthening and nmre without worsening symptoms.  Long Term Goals:  by 12 weeks  Pt. Will improve L hip ER A/PROM to WNL for crossing legs to donn/ doff shoes and socks.   Pt. Will improve L hip abd / ext strength to at least 4/5 for reciprocal stair climbing .   Pt. Will improve L SLS to > 5 sec for decreased fall risk amb without AD.      TREATMENT PLAN   Treatment Plan:  Treatment may include any combination of the following:      Strengthening, ROM, Balance training, Functional mobility training, Endurance training, Pain management, Manual, Neuromuscular re-education, Stair training, Gait training, Home exercise program, Safety education & training, Patient/Caregiver education & training, Therapeutic activities     Pt. actively involved in establishing Plan of Care and Goals: Yes  Frequency / Duration: Patient to be seen 2 time(s) per wk for 12 weeks      Requires PT Follow-Up: Yes  Treatment Initiated : yes  Specific Instructions for Next Treatment: plans for trip around [place] 2-3 weeks long- cert extended for plan to restart after trip        Treatment Included on Day of Evaluation:   Home Exercise Program:  [x]  Issued  [x]  Verbal   []  Written Handout []  Return Demonstration  Review of HEP from HHPT  Edu on increased use of Left LE on stairs and decreased [place] of UE with sit to stand   Edu on clarification with ortho for use of walker vs SPC    Therex:  SciFit L 1 x 5 min  Bent knee fall out stretch for ER   PROM L hip all planes to Librarian, academic education and instruction: Goals, PT Role, Plan of Care, Evaluative findings, Home Exercise Program, Precautions, Anatomy of condition             Therapist  Signature:    Carroll Ranney A Tyhir Schwan, PT, 11/25/2022 Start Time:  1:00 PM EST    End Time: 1:30 PM                             Evaluation Minutes:  20  Treatment Minutes: 10  Untimed Minutes:  Total Time: 30   Time Breakdown:  Modalities: 0  MT: 0  TE: 10   TA: 0  GT: 0  NMRE 0         I certify that the above Therapy Services are being furnished while the patient is under my care. I agree with the treatment plan and certify that this therapy is  necessary.      19 Signature:_______________________________________          Date:________________     Physician Comments: _______________________________________________    Please sign and return to [facility] for Physical Rehabilitation: Fax: [phone].   Benson for this referral!

## 2022-11-26 ENCOUNTER — Encounter

## 2022-11-26 MED ORDER — LEVOTHYROXINE SODIUM 25 MCG PO TABS
25 MCG | ORAL_TABLET | Freq: Every day | ORAL | 1 refills | Status: AC
Start: 2022-11-26 — End: 2023-05-28

## 2022-11-26 NOTE — Telephone Encounter (Signed)
Thyroid Hormones Refill Checklist  Passed 11/26/2022 02:15 AM   Protocol Details  Last TSH normal within the past 12 months    Visit with authorizing provider in past 9 months or upcoming 90 days      Rx sent per protocol

## 2022-11-27 ENCOUNTER — Inpatient Hospital Stay: Admit: 2022-11-27 | Payer: MEDICARE | Primary: Family

## 2022-11-27 DIAGNOSIS — Z9889 Other specified postprocedural states: Secondary | ICD-10-CM

## 2022-11-27 NOTE — Progress Notes (Signed)
Provider Number [id number]  Physical Therapy Treatment Note  Patient: Barbara Elliott (71 y.o. 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 No         HEP Compliance: Yes, Good      Pain Level:  2/10      Patient Comments:   "It's been ok. Kovelanko said the cane was ok, worried more about distance and preventing falls. The scar seems to be healing well."  Condition improvement: improved     Objective:    Restrictions/Precautions: Restrictions/Precautions: Surgical protocol; Other (comment) (osteoporosis)        Observations/Activities, tests and measures:   RW/ SPC?    Treatment:      Therapeutic Exercise:     SciFit L 1 x 5 min  LAQ 2x10  Bent knee fall out stretch for ER    PROM L hip all planes to tol   Clams 1x10  Banded bridges 2x10 RTB    Patient/ Caregiver Education and Instruction:   exercises, can add band to standing hip abd, flex/ext    Home Exercise Program: HEP was progressed or revised and Verbal discussion/Education    Assessment:    Post-Treatment Pain Level:   "achy"  Patient's Activity Tolerance:     good  tolerated the activities well with no complications.  Overall progress is considered to be: Good   Patient will continue to benefit from skilled PT services.     Fatigued with clams but otherwised tolerated  tretment well. Band given for hep    Conditions requiring continued skilled PT services: Body Structures, Functions, Activity Limitations Requiring Skilled Therapeutic Intervention: Decreased functional mobility ; Decreased ADL status; Decreased ROM; Decreased strength; Decreased endurance; Decreased balance; Increased pain      Factors which may impact rehabilitation potential include:       Plan:   Continue Therapy current plan of care      Therapist Electronic Signature:    Vinetta Bergamo, PTA, 11/27/2022 Start Time:  9:00 AM EST    End Time: 9:30   Timed Minutes:  30  Untimed Minutes:   Total Treatment Time: 30   Time Breakdown:  Modalities:   MT:     TE: 30    TA:    GT:    NMRE

## 2022-12-01 ENCOUNTER — Inpatient Hospital Stay: Admit: 2022-12-01 | Payer: MEDICARE | Primary: Family

## 2022-12-01 NOTE — Progress Notes (Signed)
Provider Number [id number]  Physical Therapy Treatment Note  Patient: Barbara Elliott (71 y.o. 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 No         HEP Compliance: Yes      Pain Level:  2      Patient Comments:   a little sore and achy, but not bad; incision looks good- distal incision red and appears like there maybe a stitch or scab protruding, dry, not oozing- pt education on keeping an eye on distal incision  Condition improvement: stable     Objective:    Restrictions/Precautions: Restrictions/Precautions: Surgical protocol; Other (comment) (osteoporosis)        Observations/Activities, tests and measures:              Treatment:      Therapeutic Exercise:     SCI-FIT L3 x 6"    Seated Hip ABD 15# x 10 x 2- decreased from 30#  Seated Hip ADD 20# x 10 x 2     B LE HS 15# x 10 x 2- c/o R knee weakness    B LE Leg Press (5) 50# x 10 x 2    Sit<>Stand from Chair 10 x     Neuromuscular Reeducation:    Static Balance on Foam:  Weight Shifting Side/Side 10x   SLS w/ HHA 30 secs ea- "crepitus" on L knee  SLS w/ Weight Shifting: Static Step Fwd/Retro 10 x ea          Patient/ Caregiver Education and Instruction:   self care and exercises, soreness with new exercises    Home Exercise Program: Verbal discussion/Education    Assessment:     Post-Treatment Pain Level:   2  Patient's Activity Tolerance:     good  tolerated the activities well with no complications. R knee giving her some trouble, progressing w/ strength, ROM and balance    Overall progress is considered to be: Slow   Patient will continue to benefit from skilled PT services.     Conditions requiring continued skilled PT services: Body Structures, Functions, Activity Limitations Requiring Skilled Therapeutic Intervention: Decreased functional mobility ; Decreased ADL status; Decreased ROM; Decreased strength; Decreased endurance; Decreased balance; Increased pain      Factors which may impact rehabilitation potential include:       Plan:   Continue Therapy current plan of care, Update Exercises and Activities as tolerated  balance exercise, neuromuscular re-education/strengthening, range of motion: active/assisted/passive, and therapeutic exercise/strengthening    Therapist Electronic Signature:    Doylene Canning, PTA, 12/01/2022 Start Time: 6213    End Time: 1503   Timed Minutes:  38  Untimed Minutes:  Total Treatment Time: 38   Time Breakdown:  Modalities:   MT:     TE: 28    TA:    GT:    NMRE 10

## 2022-12-03 ENCOUNTER — Inpatient Hospital Stay: Admit: 2022-12-03 | Payer: MEDICARE | Primary: Family

## 2022-12-03 NOTE — Progress Notes (Signed)
Provider Number [id number]  Physical Therapy Treatment Note  Patient: Barbara Elliott (71 y.o. 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 No         HEP Compliance: Yes, Good      Pain Level:  0/10      Patient Comments:   "I feel like overall I don't notice a change but then sometimes I do notice I haven't used my cane as much. Little sore with the new stuff last time but not persistent. Today I perfected the clam shell. Doing stairs more foot over foot on the way up. I feel more stable on the L leg than before surgery, easier to dry off my R leg."  Condition improvement: improved     Objective:    Restrictions/Precautions: Restrictions/Precautions: Surgical protocol; Other (comment) (osteoporosis)        Observations/Activities, tests and measures:       Treatment:      Therapeutic Exercise:     SCI-FIT L3 x 6"     Seated Hip ABD 15# x 10 x 2-   Seated Hip ADD 20# x 10 x 2      B LE HS 15# x 10 x 2- c/o R knee weakness     B LE Leg Press (5) 50# x 10 x 2     Sit<>Stand from Chair 10 x      Neuromuscular Reeducation:    Static Balance on Foam:  Weight Shifting Side/Side 10x   SLS w/ HHA 30 secs ea- no crepitus today  SLS w/ Weight Shifting: Static Step Fwd/Retro 10 x ea  Semi tandem on foam 2x30" ea  Hip hike foam 2x10 L standing  on foam    Patient/ Caregiver Education and Instruction:   exercises     Home Exercise Program: Verbal discussion/Education    Assessment:    Post-Treatment Pain Level:   0/10  Patient's Activity Tolerance:     good  tolerated the activities well with no complications.  Overall progress is considered to be: Good   Patient will continue to benefit from skilled PT services.     Conditions requiring continued skilled PT services: Body Structures, Functions, Activity Limitations Requiring Skilled Therapeutic Intervention: Decreased functional mobility ; Decreased ADL status; Decreased ROM; Decreased strength; Decreased endurance; Decreased balance; Increased pain      Factors which may impact rehabilitation potential include:       Plan:   Continue Therapy current plan of care      Therapist Electronic Signature:    Vinetta Bergamo, PTA, 12/03/2022 Start Time:  3:00 PM EST    End Time: 3:30   Timed Minutes:  Untimed Minutes:   Total Treatment Time: 30   Time Breakdown:  Modalities:   MT:     TE: 15    TA:    GT:    NMRE 15

## 2022-12-08 ENCOUNTER — Inpatient Hospital Stay: Admit: 2022-12-08 | Payer: MEDICARE | Primary: Family

## 2022-12-08 NOTE — Progress Notes (Signed)
Provider Number [id number]  Physical Therapy Treatment Note  Patient: Barbara Elliott (71 y.o. 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 No         HEP Compliance: Yes, Good      Pain Level:  2/10      Patient Comments:   "It's not as bad today, it was really achy yesterday. Not sure. It was bothering my back a little bit. I'm wondering if it may be from stopping the meloxicam."   Condition improvement: improved     Objective:    Restrictions/Precautions: Restrictions/Precautions: Surgical protocol; Other (comment) (osteoporosis)        Observations/Activities, tests and measures:       Treatment:       Therapeutic Exercise:     SCI-FIT L3 x 6"     Hip flexor stretch standing 2x30" L  Quad stretch on stool 2x30"    Seated Hip ABD 15# x 10 x 2  Seated Hip ADD 20# x 10 x 2      B LE HS 15# x 10 x 2- c/o R knee weakness  B LE Leg Press (5) 50# x 10 x 2     Sit<>Stand from Chair 10x then 10x with 8lb weight from bench     Neuromuscular Reeducation:    Static Balance on Foam:  Weight Shifting Side/Side 10x   SLS w/ HHA 30 secs ea- no crepitus today  SLS w/ Weight Shifting: Static Step Fwd/Retro 10 x ea  Semi tandem on foam 2x30" ea  Hip hike foam 2x10 L standing on foam    Patient/ Caregiver Education and Instruction:   exercises,  increasing arm swing    Home Exercise Program: Verbal discussion/Education    Assessment:    Post-Treatment Pain Level:   2/10  Patient's Activity Tolerance:     good  tolerated the activities well with no complications.  Overall progress is considered to be: Good   Patient will continue to benefit from skilled PT services.     Conditions requiring continued skilled PT services: Body Structures, Functions, Activity Limitations Requiring Skilled Therapeutic Intervention: Decreased functional mobility ; Decreased ADL status; Decreased ROM; Decreased strength; Decreased endurance; Decreased balance; Increased pain      Factors which may impact rehabilitation potential include:       Plan:   Continue Therapy current plan of care      Therapist Electronic Signature:    Denita Lung, PTA, 12/08/2022 Start Time:  1:30 PM EST    End Time: 2:00   Timed Minutes:  30  Untimed Minutes:   Total Treatment Time: 30   Time Breakdown:  Modalities:  MT:     TE: 20    TA:    GT:    NMRE 10

## 2022-12-10 ENCOUNTER — Inpatient Hospital Stay: Admit: 2022-12-10 | Payer: MEDICARE | Primary: Family

## 2022-12-10 NOTE — Progress Notes (Signed)
Provider Number [id number]  Physical Therapy Treatment Note  Patient: Barbara Elliott (71 y.o. 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 No         HEP Compliance: Yes      Pain Level:  NR      Patient Comments:   pain ha not been an issue with hip mostly knee on opposite side. Started getting an aura walking in , may be getting  Condition improvement: improved     Objective:    Restrictions/Precautions: Restrictions/Precautions: Surgical protocol; Other (comment) (osteoporosis)          Treatment:      Therapeutic Exercise:     SCI-FIT L4 x 6"     B LE HS 15# x 10 x 2- c/o L HS pulling  B LE Leg Press (5) 50# x 10 x 2    Seated Hip ABD 15# x 10 x 2  Seated Hip ADD 20# x 10 x 2         Sit<>Stand 2 x 10 with 8lb weight from bench     Neuromuscular Reeducation:    Static Balance on Foam:  Weight Shifting Side/Side 10x   SLS w/ HHA 30 secs ea- no crepitus today  SLS w/ Weight Shifting: Static Step Fwd/Retro 10 x ea  Semi tandem on foam 2x30" Educational psychologist and Instruction:   self care, activity modification, and exercises ; POC    Home Exercise Program:     Assessment:    Post-Treatment Pain Level:   NR  Patient's Activity Tolerance:     good  tolerated the activities well with no complications.  Overall  progress is considered to be: Good   Patient will continue to benefit from skilled PT services.     Conditions requiring continued skilled PT services: Body Structures, Functions, Activity Limitations Requiring Skilled Therapeutic Intervention: Decreased functional mobility ; Decreased ADL status; Decreased ROM; Decreased strength; Decreased endurance; Decreased balance; Increased pain      Factors which may impact rehabilitation potential include:       Plan:   Continue Therapy current plan of care  x 1 week then 2 week vacation - up to 8 visit upon return from vacation    Therapist Electronic Signature:    Lavinia Mcneely A Samuell Knoble, PT, 12/10/2022 Start Time:  2:30 PM EST    End Time: 3:00 PM   Timed Minutes:  30  Untimed Minutes: 0  Total Treatment Time: 30   Time Breakdown:  Modalities: 0  MT: 0    TE: 20    TA: 0   GT: 0   NMRE 10

## 2022-12-14 ENCOUNTER — Encounter: Payer: MEDICARE | Primary: Family

## 2022-12-15 ENCOUNTER — Encounter

## 2022-12-16 ENCOUNTER — Ambulatory Visit: Admit: 2022-12-16 | Discharge: 2022-12-16 | Payer: MEDICARE | Attending: Orthopaedic Surgery | Primary: Family

## 2022-12-16 ENCOUNTER — Encounter: Payer: MEDICARE | Primary: Family

## 2022-12-16 ENCOUNTER — Inpatient Hospital Stay: Admit: 2022-12-16 | Discharge: 2022-12-16 | Payer: MEDICARE | Primary: Family

## 2022-12-16 DIAGNOSIS — Z4789 Encounter for other orthopedic aftercare: Secondary | ICD-10-CM

## 2022-12-16 DIAGNOSIS — Z96642 Presence of left artificial hip joint: Secondary | ICD-10-CM

## 2022-12-16 NOTE — Progress Notes (Signed)
Provider Number [id number]  Physical Therapy Treatment Note  Patient: Barbara Elliott (71 y.o. 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 No         HEP Compliance: Yes, Good      Pain Level:  1/10      Patient Comments:   "It's actually feeling pretty good today. Just saw Dr. Cheryle Horsfall. Going to see family in [place] til the new year, leaving Friday. R knee feels more troublesome than the hip."  Condition improvement: improved     Objective:    Restrictions/Precautions: Restrictions/Precautions: Surgical protocol; Other (comment) (osteoporosis)        Observations/Activities, tests and measures:       Treatment:        Therapeutic Exercise:     SCI-FIT L4 x 6"     B LE HS 15# x 10 x 2- c/o R knee discomfort, use L LE more   B LE Leg Press (5) 50# x 10 x 2     Seated Hip ABD 15# x 10 x 2  Seated Hip ADD 20# x 10 x 2  25# second set     Sit<>Stand 2 x 10 with 8lb weight from bench     Banded side steps YTB 2x46ft    Neuromuscular Reeducation:    Static Balance on Foam:  Weight Shifting Side/Side 10x   SLS w/ HHA 30 secs ea- no crepitus today  SLS w/ Weight toe tap 12pm and 6pm 10x ea side  Semi tandem on foam 2x30" Advertising copywriter Caregiver Education and Instruction:   exercises    Home Exercise Program: HEP was progressed or  revised    Assessment:    Post-Treatment Pain Level:   1/10  Patient's Activity Tolerance:     good  tolerated the activities well with no complications.  Overall progress is considered to be: Good   Patient will continue to benefit from skilled PT services.     Conditions requiring continued skilled PT services: Body Structures, Functions, Activity Limitations Requiring Skilled Therapeutic Intervention: Decreased functional mobility ; Decreased ADL status; Decreased ROM; Decreased strength; Decreased endurance; Decreased balance; Increased pain      Factors which may impact rehabilitation potential include:       Plan:   Continue Therapy current plan of care      Therapist Electronic Signature:    Vinetta Bergamo, PTA, 12/16/2022 Start Time:  9:00 AM EST    End Time: 9:30   Timed Minutes:  30  Untimed Minutes:   Total Treatment Time: 30   Time Breakdown:  Modalities:   MT:     TE: 15  TA:    GT:    NMRE 15

## 2022-12-16 NOTE — Progress Notes (Unsigned)
CC:   Postop followup s/p left THA    Subjective:   The patient returns for postoperative visit following hip replacement surgery. The postoperative course has been ***uncomplicated. The surgical wound {wound healing:45168::"has been healing appropriately"}. There has been ***no drainage nor other local signs of infection. Although there is still pain from surgery, the patient reports that hip pain and function are improved over the preoperative state. The patient is ***pleased with the early outcome.    ROS:   The patient denies fevers, chills or other local or systemic signs of prosthetic joint infection.    Exam:  The patient is awake, alert and appropriate.  The head is normocephalic.  The sclerae are white.  The skin has normal turgor.  The abdomen is non-protruded.  The chest expands normally with deep inspiration.      Focused examination of the  left  hip reveals a well-healed surgical incision over the anterior aspect of the hip. There is no surrounding erythema or vasomotor disturbance. The trochanter is nontender to palpation. There is no defect in the abductor mechanism. In the seated position rotational motion of the hip is painless. There is good hip flexor and abductor strength in the seated position. Femoral and sciatic nerve function is normal. The calf is soft and supple.    Imaging:   Xrays obtained today at [facility] were independently reviewed and demonstrate:  There is an AP view and a lateral view of the operative hip. On the AP view there is a {THAstemfixation:45170::"well placed "} total hip arthroplasty with a cerclage cable about the proximal femur, proximal to the lesser trochanter.  The lateral view reveals appropriate alignment of the femoral component. There is no evidence of wear, lysis, fracture or loosening.    Assessment:   71 y.o. female {postop duration:45140} s/p {LEFT/RIGHT/BILATERAL:42177} THA    Plan:  I am pleased with the early clinical and radiographic result. We have  discussed further recovery milestones. The patient may engage in low impact activities as tolerated. Infection prophylaxis is reviewed. The patient will return to see [place] {postop fu:45141}    This chart was dictated using M Modal, a voice to text software; inherent to using this technology, there may be incorrectly spelled words, or erroneous phrases. Please call the office with any questions or concerns.

## 2022-12-23 NOTE — Telephone Encounter (Signed)
-----   Message from Lajean Saver, RN sent at 12/23/2022  1:22 PM EST -----  Regarding: RE: Post Op rescheduling  Can we do the first or second week of February?  ----- Message -----  From: Wendy Poet  Sent: 12/23/2022   9:11 AM EST  To: [facility]  Subject: Post Op rescheduling                             Patient is scheduled for Post Op on 02/07 with [facility], who is not in office and appt needs to be rescheduled.  Please advise on rescheduling timeframe.    Amy L Violette

## 2022-12-23 NOTE — Telephone Encounter (Signed)
Called & spoke to patient to move 02/07 Post Op appt with [facility] as provider will not be in office.  Moved patient to 02/01/23      Amy L Violette

## 2023-01-05 ENCOUNTER — Encounter: Payer: MEDICARE | Primary: Family

## 2023-01-05 DIAGNOSIS — Z9889 Other specified postprocedural states: Secondary | ICD-10-CM

## 2023-01-05 NOTE — Progress Notes (Addendum)
Provider Number [id number]  Physical Therapy Progress note and Treatment note  Patient: Barbara Elliott (72 y.o. 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 No          HEP Compliance: Yes        Pain Level:  0/10        Patient Comments:   L hip doing great all around, mostly just right knee. Using cane only for distance. One of the biggest differences is able to cross leg for stressing and lifting leg up to dry off after shower and stairs are better.   Condition improvement: improved    Objective:    Restrictions/Precautions: Restrictions/Precautions: Surgical protocol; Other (comment) (osteoporosis)        Observations/Activities, tests and measures:   SLS L able to attempt and maintain balance - unable to hold 3 sec without UE A    Ambulating short distances without AD continued left lateral trunk lean     Left AROM  Left PROM              PROM LLE (degrees)  L Hip Flexion (0-125): 110  L Hip Extension (0-10): 5  L Hip ABduction (0-45): 40  L Hip External Rotation (0-45): 40  L Hip Internal Rotation (0-45): 20         Outcome Measure(s) Completed:   Lower Extremity Functional Scale (LEFS)  Any of your usual work, housework, or school activities: No Difficulty  Your usual hobbies, recreational,  or sporting activities: Moderate Difficulty  Getting into or out of the bath: No Difficulty  Walking between rooms: No Difficulty  Putting on your shoes or socks: No Difficulty  Squatting: Quite a Bit of Difficulty  Lifting an object, like a bag of groceries from the floor: A Little Bit of Difficulty  Performing light activities around your home: No Difficulty  Performing heavy activities around your home: A Little Bit of Difficulty  Getting into or out of a car: A Little Bit of Difficulty  Walking 2 blocks: Moderate Difficulty  Walking a mile: Extreme Difficulty or Unable to Perform Activity  Going up or down 10 stairs (about 1 flight of stairs): A Little Bit of Difficulty  Standing for 1 hour: Moderate Difficulty  Sitting for 1 hour: No Difficulty  Running on even ground : Extreme Difficulty or Unable to Perform Activity  Running on uneven ground : Extreme Difficulty or Unable to Perform Activity  Making sharp turns while running fast : Extreme Difficulty or Unable to Perform Activity  Hopping: Extreme Difficulty or Unable to Perform Activity  Rolling over in bed: No Difficulty  LEFS Total Score: 47  LEFS Disability Index: 40-59%  LEFS CMS Modifier: CK     Current Total Score: 47 / 80 (Date: 01/05/2023)    Interpretation of Score: 20 questions each scored on a 5-point scale with 0 representing "extreme difficulty or unable to perform" and 4 representing "no difficulty".  The lower the score, the greater the functional disability. 80/80 represents no disability.  Minimal detectable change is 9 points.    Treatment:      Therapeutic Exercise:     R. Bike L 5 x 5 min  B LE Leg Press (4) 50# x 10 x 2  Rotary hip flex 12.5" x 10 B, Ext 25# x 10 B   Seated Hip ABD 20# x 10 x 2       Neuromuscular Reeducation:    Static Balance on Foam:  Weight Shifting Side/Side 10x   SLS w/ HHA 30 secs ea- no crepitus today  SLS w/ Weight toe tap 12pm and 6pm 10x ea side  Semi tandem on foam 2x30" Armed forces training and education officer and  Instruction:   self care, activity modification, and exercises ; POC    Home Exercise Program:  SLS at home in mirror for trunk correction    Assessment:    Post Treatment Pain level:   0/10  Patient's Activity Tolerance:     good    Overall goal progress is considered to be: Good   Patient will   continue to benefit from skilled PT services.   Conditions requiring continued skilled PT services: Body Structures, Functions, Activity Limitations Requiring Skilled Therapeutic Intervention: Decreased functional mobility ; Decreased ADL status; Decreased ROM; Decreased strength; Decreased endurance; Decreased balance; Increased pain    Overall Goal Update:  partially met     Short Term Goals:  by 6 weeks  Pt. Will be indep and compliant with HEP for optimal post op outcome- MET  Pt. To tol progressive stretching , strengthening and nmre without worsening symptoms.- MET  Long Term Goals:  by 12 weeks  Pt. Will improve L hip ER A/PROM to WNL for crossing legs to donn/ doff shoes and socks. - MET  Pt. Will improve L hip abd / ext strength to at least 4/5 for reciprocal stair climbing . - slow progress  Pt. Will improve L SLS to > 5 sec for decreased fall risk amb without AD. - slow progress          Patient's rehabilitation potential/prognosis: is considered to be: Therapy Prognosis: Good    Factors which may impact rehabilitation potential include: Barriers impacting rehab : Medical co-morbidities         Treatment Plan     Plan Frequency: 2  Plan weeks: 4  Current Treatment Recommendations: Strengthening; ROM; Balance training; Functional mobility training; Endurance training; Pain management; Manual; Neuromuscular re-education; Stair training; Gait training; Home exercise program; Safety education & training; Patient/Caregiver education & training; Therapeutic activities        Continue Therapy current plan of care      Therapist Electronic Signature:    Sayer Masini A Odie Rauen, PT, 01/05/2023 Start Time:  9:30 AM EST      End  Time: 10:00 AM                                             Timed Minutes:  30  Untimed Minutes: 0  Total Treatment Time: 30   Time Breakdown:  Modalities: 0  MT: 0    TE: 20    TA: 0   GT: 0   NMRE 10

## 2023-01-07 ENCOUNTER — Inpatient Hospital Stay: Admit: 2023-01-07 | Payer: MEDICARE | Primary: Family

## 2023-01-07 NOTE — Progress Notes (Signed)
Provider Number [phone] [id number]  Physical Therapy Treatment Note  Patient: Barbara Elliott (72 y.o. 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 No         HEP Compliance: Yes      Pain Level:  0/10      Patient Comments:   feeling good, not bad after last visit  Condition improvement: stable     Objective:    Restrictions/Precautions: Restrictions/Precautions: Surgical protocol; Other (comment) (osteoporosis)            Treatment:      Therapeutic Exercise:     Sci Fit L 5 x 5 min  B LE Leg Press (4) 50# x 10 x 2; Uni L 20# 2x10  Rotary hip flex 12.5" x 10 B, Ext 25# x 10 B   Seated Hip ABD 20# x 10 x 2       Neuromuscular Reeducation:    Static Balance on Foam:  Weight Shifting Side/Side 10x   SLS w/ Weight toe tap fwd, bk 10x ea side  Semi tandem on foam 2x30" ea  SLS flat ground fingertip assist     Patient/ Caregiver Education and Instruction:   self care, activity modification, and exercises     Home Exercise Program:     Assessment:    Post-Treatment Pain Level:   0/10  Patient's Activity Tolerance:     good  tolerated the activities well with no complications.  Overall progress is considered to be: Good   Patient will continue to benefit from skilled PT services.     Conditions requiring continued skilled PT services: Body  Structures, Functions, Activity Limitations Requiring Skilled Therapeutic Intervention: Decreased functional mobility ; Decreased ADL status; Decreased ROM; Decreased strength; Decreased endurance; Decreased balance; Increased pain      Factors which may impact rehabilitation potential include:       Plan:   Continue Therapy current plan of care      Therapist Electronic Signature:    Shamond Skelton A Leotha Westermeyer, PT, 01/07/2023 Start Time: 10:00 AM EST    End Time: 10:30 AM   Timed Minutes:  30  Untimed Minutes: 0  Total Treatment Time: 30   Time Breakdown:  Modalities: 0  MT: 0    TE: 30    TA: 0   GT: 0   NMRE 0

## 2023-01-11 ENCOUNTER — Inpatient Hospital Stay: Admit: 2023-01-11 | Payer: MEDICARE | Primary: Family

## 2023-01-11 NOTE — Progress Notes (Signed)
Provider Number [id number]  Physical Therapy Treatment Note  Patient: Barbara Elliott (72 y.o. 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 No         HEP Compliance: Yes      Pain Level:  NR      Patient Comments:   sore in thigh after last session, not bad just know we worked it  Condition improvement: improved     Objective:    Restrictions/Precautions: Restrictions/Precautions: Surgical protocol; Other (comment) (osteoporosis)          Treatment:      Therapeutic Exercise:     Sci Fit L 5 x 5 min  B LE Leg Press (4) 50# x 10 x 2; Uni L 20# 2x10  Rotary hip flex 12.5# x 10 B, Ext 25# x 10 B   Seated Hip ABD 20# x 10 x 2  Standing hip flexor st 30s  Standing HS st on stool x 30s      Neuromuscular Reeducation:    Static Balance on Foam:  Weight Shifting Side/Side 10x   SLS w/ Weight toe tap fwd, bk 10x ea side  Semi tandem on foam 2x30" ea  SLS flat ground fingertip assist     Patient/ Caregiver Education and Instruction:   self care, activity modification, and exercises     Home Exercise Program:     Assessment:    Post-Treatment Pain Level:   0/10  Patient's Activity Tolerance:     good  tolerated the activities well with no complications.  Overall progress is considered to be: Good   Patient will continue to  benefit from skilled PT services.     Conditions requiring continued skilled PT services: Body Structures, Functions, Activity Limitations Requiring Skilled Therapeutic Intervention: Decreased functional mobility ; Decreased ADL status; Decreased ROM; Decreased strength; Decreased endurance; Decreased balance; Increased pain      Factors which may impact rehabilitation potential include:       Plan:   Continue Therapy current plan of care      Therapist Electronic Signature:    Gianna Calef A Arlone Lenhardt, PT, 01/11/2023 Start Time: 10:30 AM EST    End Time: 10:58 AM   Timed Minutes:  28  Untimed Minutes: 0  Total Treatment Time: 28   Time Breakdown:  Modalities: 0  MT: 0    TE: 20    TA: 0   GT: 0   NMRE 8

## 2023-01-14 ENCOUNTER — Inpatient Hospital Stay: Admit: 2023-01-14 | Payer: MEDICARE | Primary: Family

## 2023-01-14 NOTE — Progress Notes (Signed)
Provider Number [id number]  Physical Therapy Treatment Note  Patient: Barbara Elliott (72 y.o. female)    DOB:  December 09, 1951  Treatment Date: 37/62/8315  Plan of Care/Certification Expiration Date: 02/23/23     Referring Provider: Raliegh Ip, MD  Sec Prov:   Start of Care Date: 11/25/2022   Insurance: Payor: [facility] 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 No         HEP Compliance: Yes      Pain Level:  0/10      Patient Comments:   sore in muscles after last visit again  Condition improvement: improved     Objective:    Restrictions/Precautions: Restrictions/Precautions: Surgical protocol; Other (comment) (osteoporosis)            Treatment:      Therapeutic Exercise:     Sci Fit L 5 x 5 min  B LE Leg Press (4) 50# x 10 x 2; Uni L 20# 2x10  Rotary hip flex 12.5# x 12 B, Ext 25# x 10 B   Seated Hip ABD 20# x 10 x 2  Standing hip flexor st 30s  Standing HS st on stool x 30s      Neuromuscular Reeducation:    Static Balance on Foam:  Weight Shifting Side/Side 10x   SLS w/ Weight toe tap fwd, bk 10x ea side  Semi tandem on foam 2x30" ea  SLS flat ground fingertip assist     Patient/ Caregiver Education and Instruction:   self care, activity modification, and exercises     Home Exercise Program:     Assessment:    Post-Treatment Pain Level:   "feels stronger today" 0/10  Patient's Activity Tolerance:     good  tolerated the activities well with no complications.  Overall progress is considered to be: Good   Patient will continue to  benefit from skilled PT services.     Conditions requiring continued skilled PT services: Body Structures, Functions, Activity Limitations Requiring Skilled Therapeutic Intervention: Decreased functional mobility ; Decreased ADL status; Decreased ROM; Decreased strength; Decreased endurance; Decreased balance; Increased pain      Factors which may impact rehabilitation potential include:       Plan:   Continue Therapy current plan of care      Therapist Electronic Signature:    Lourdez Mcgahan A Vani Gunner, PT, 01/14/2023 Start Time:  1:30 PM EST    End Time: 2:00 PM   Timed Minutes:  30  Untimed Minutes: 0  Total Treatment Time: 30   Time Breakdown:  Modalities: 0  MT: 0    TE: 22    TA: 0   GT: 0   NMRE

## 2023-01-18 ENCOUNTER — Ambulatory Visit: Admit: 2023-01-18 | Discharge: 2023-01-18 | Payer: MEDICARE | Attending: Family | Primary: Family

## 2023-01-18 DIAGNOSIS — M1612 Unilateral primary osteoarthritis, left hip: Secondary | ICD-10-CM

## 2023-01-18 DIAGNOSIS — I10 Essential (primary) hypertension: Secondary | ICD-10-CM

## 2023-01-18 MED ORDER — ASPIRIN 81 MG PO TBEC
81 MG | ORAL_TABLET | Freq: Every day | ORAL | 3 refills | Status: AC
Start: 2023-01-18 — End: ?

## 2023-01-18 NOTE — Assessment & Plan Note (Signed)
   Recommendations: eating a healthy diet, low-sodium intake (<1.5gm/day), and regular aerobic exercise (150 minutes or more per week).   Encouraged home blood pressure monitoring, and follow up if systolic blood pressure is over 140 or diastolic pressure is over 90 consistently.   Plan and medication management: Blood pressure is elevated - hold off on medication adjustment for now, and re-evaluate at next follow-up.

## 2023-01-18 NOTE — Assessment & Plan Note (Signed)
ORIF completed-   Has 4 rounds of PT left.  Ambulating with out difficulty -using cane outside of the house  Doing well pain improved and not currently on any medication.

## 2023-01-18 NOTE — Progress Notes (Signed)
[facility]   [address]  [place] [address]    CHIEF COMPLAINT   Barbara Elliott is a 72 y.o. female who presents today for follow-up of Follow-up    HISTORY OF PRESENT ILLNESS     Patient is here for follow up post operatively.  She completed left hip surgery on 11/03/2022- Dr. Elam Dutch, through [facility]  She was discharged same day. No complications-   Set up home visits with PT- has 4 more visits.  Uses a cane as needed when she is out.  If going any sort of a distance uses the cane when she is out and about.   She has a follow up on 02/01/2023.       HYPERTENSION:    She reports good medication compliance and no side effects.   Home blood pressure monitoring is  not monitoring as much as she should- probably close to 140's like today  Chest pain:  no  Dizziness:  no  New or worsening leg edema:  no    BP Readings from Last 3 Encounters:   01/18/23 (!) 144/82   11/25/22 (!) 142/80   11/03/22 129/73     Lab Results   Component Value Date    K 4.0 10/21/2022    CREATININE 0.91 10/21/2022    NA 134 (L) 10/21/2022      Key Anti-Hypertensive Meds            metoprolol succinate (TOPROL XL) 25 MG extended release tablet (Taking)    Sig: TAKE 1 TABLET BY MOUTH EVERY DAY FOR BLOOD PRESSURE    Patient taking differently: Take 1 tablet by mouth daily    Cosign for Ordering: Accepted by Edmonia Lynch, APRN - NP on 10/08/2022  7:12 AM    amLODIPine (NORVASC) 10 MG tablet (Taking)    Sig: TAKE 1 TABLET BY MOUTH EVERY DAY    Patient taking differently: Take 1 tablet by mouth every evening    Cosign for Ordering: Accepted by Dory Peru, MD on 05/23/2022  6:27 PM              Reviewed on today's visit patient's past medical/surgical history, family/social history, medications allergies, previous lab work and imaging studies immunizations and preventive care    PHYSICAL EXAM   Vitals signs reviewed.   Constitutional:       General: Patient is not in any acute distress.      Appearance: Patient is well-developed and well nourished.  HENT:      Head: Normocephalic.   Cardiovascular:      Rate and Rhythm: Normal rate and regular rhythm.      Heart sounds: Normal heart sounds. No murmur. No friction rub.      No edema present to lower extremities.  Pulmonary:      Effort: Pulmonary effort is normal. No respiratory distress      Breath sounds: Normal breath sounds throughout. No wheezing or rales.   Skin:     Comments:intact  Neurological:      Mental Status: Patient is oriented to person, place, and time.      Cranial Nerves: No cranial nerve deficit  Psychiatric:         Mood and Affect:    Mood normal     Behavior: Behavior normal.         Thought Content: Thought content normal.         Judgment: Judgment normal.  MEDICATIONS     Current Outpatient Medications   Medication Sig    aspirin 81 MG EC tablet Take 1 tablet by mouth daily morning    acetaminophen (TYLENOL) 500 MG tablet Take 2 tablets by mouth every 6 hours as needed for Pain    levothyroxine (SYNTHROID) 25 MCG tablet TAKE 1 TABLET BY MOUTH EVERY DAY    atorvastatin (LIPITOR) 40 MG tablet TAKE 1 TABLET BY MOUTH EVERY DAY (Patient taking differently: Take 1 tablet by mouth every evening)    metoprolol succinate (TOPROL XL) 25 MG extended release tablet TAKE 1 TABLET BY MOUTH EVERY DAY FOR BLOOD PRESSURE (Patient taking differently: Take 1 tablet by mouth daily)    amLODIPine (NORVASC) 10 MG tablet TAKE 1 TABLET BY MOUTH EVERY DAY (Patient taking differently: Take 1 tablet by mouth every evening)    Multiple Vitamin (MULTIVITAMIN ADULT PO) Take 1 tablet by mouth daily    Cholecalciferol 50 MCG (2000 UT) TABS Take 1 tablet by mouth daily    glucosamine-chondroitin 500-400 MG CAPS Take 1 capsule by mouth daily     No current facility-administered medications for this visit.     Medications Discontinued During This Encounter   Medication Reason    calcium carbonate 1500 (600 Ca) MG TABS tablet Alternate therapy    aspirin 81  MG EC tablet        ALLERGIES     Allergies   Allergen Reactions    Codeine Shortness Of Breath    Lisinopril Cough     ACTIVE MEDICAL PROBLEMS     Patient Active Problem List   Diagnosis    Encounter for hepatitis C screening test for low risk patient    Hypertension    Preventative health care    Mixed hyperlipidemia    Left hip pain    Encounter for immunization    Encounter for subsequent annual wellness visit (AWV) in Medicare patient    Vitamin D deficiency    Lumbar radiculopathy    At high risk for falls    History of lumbar discectomy    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  Her husband does smoke cigars.  She does not use illicit drugs.  She does drink wine 1-2 glasses at night.    She enjoys her time with grand children, reading.    Wears a seat belt in a car.          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 Married to [employer].  4 children together:  - Sean 1970   -Adam 1972 Grand child- 1 boy  -Lyla Son 1975 Grand child -1 girl  -[name] [phone] - 3 girls    She does currently smoke cigarettes - 1/2 a pack a week, not ever     VITALS     Vitals:    01/18/23 1356   BP: (!) 144/82   Site: Left Upper Arm   Position: Sitting   Cuff Size: Medium Adult   Pulse: 63   SpO2: 97%   Weight: 68.1 kg (150 lb 3.2 oz)   Height: 1.562 m (5' 1.5")    - Body mass index is 27.92 kg/m.    ASSESSMENT AND PLAN     1. Primary hypertension  Assessment &  Plan:  Recommendations: eating a healthy diet, low-sodium intake (<1.5gm/day), and regular aerobic exercise (150 minutes or more per week).  Encouraged home blood pressure monitoring, and follow up if systolic blood pressure is over 161 or diastolic pressure is over 90 consistently.  Plan and medication management: Blood pressure is elevated - hold off on medication adjustment for now, and re-evaluate at next  follow-up.     2. Primary osteoarthritis of left hip  Assessment & Plan:  ORIF completed-   Has 4 rounds of PT left.  Ambulating with out difficulty -using cane outside of the house  Doing well pain improved and not currently on any medication.         Orders:  -     aspirin 81 MG EC tablet; Take 1 tablet by mouth daily morning, Disp-90 tablet, R-3NO PRINT  3. Encounter for immunization  -     Influenza, FLUARIX, (age 59 mo+),  IM, Preservative Free, 0.5 mL      Follow up:  No follow-ups on file.     Future Appointments   Date Time Provider [place]   01/19/2023  2:30 PM Glean Hess [facility]   01/21/2023  9:30 AM Drown, Gracy Bruins, PT [facility]   01/25/2023  9:30 AM Drown, Gracy Bruins, PT [facility]   01/28/2023  9:30 AM Drown, Gracy Bruins, PT [facility]   02/01/2023 10:30 AM Redmond Pulling, APRN - NP ORTHO [facility]   05/25/2023  1:00 PM Edmonia Lynch, APRN - NP [facility]       Donika Butner Jonna Coup, APRN - NP  01/18/2023

## 2023-01-19 ENCOUNTER — Encounter: Payer: MEDICARE | Primary: Family

## 2023-01-19 NOTE — Progress Notes (Signed)
Provider Number [id number]  Physical Therapy Treatment Note  Patient: Barbara Elliott (72 y.o. 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 No         HEP Compliance: Yes, Good      Pain Level:  1/10      Patient Comments:   "I'm doing well. I have some achiness R side of the hip today, not bad. Not sure why it's increased. Using the cane only for longer distances, balance is better."  Condition improvement: stable     Objective:    Restrictions/Precautions: Restrictions/Precautions: Surgical protocol; Other (comment) (osteoporosis)        Observations/Activities, tests and measures:       Treatment:      Therapeutic Exercise:     Sci Fit L 5 x 5 min  B LE Leg Press (4) 55# x 10 x 2; Uni L 30# 2x10  Rotary hip flex 12.5# x 12 B, Ext 25# x 10 B   Seated Hip ABD 20# x 15 x 2  Standing hip flexor st 30s  Standing HS st on stool x 30s      Neuromuscular Reeducation:    SLS w/ Weight toe tap fwd, bk 10x ea side  Semi tandem on foam 2x30" ea  SLS flat ground fingertip assist     Patient/ Caregiver Education and Instruction:   exercises     Home Exercise Program: Verbal discussion/Education    Assessment:    Post-Treatment Pain Level:   1/10  Patient's Activity Tolerance:     good  tolerated the activities well with no  complications.  Overall progress is considered to be: Good   Patient will continue to benefit from skilled PT services.     Conditions requiring continued skilled PT services: Body Structures, Functions, Activity Limitations Requiring Skilled Therapeutic Intervention: Decreased functional mobility ; Decreased ADL status; Decreased ROM; Decreased strength; Decreased endurance; Decreased balance; Increased pain      Factors which may impact rehabilitation potential include:       Plan:   Continue Therapy current plan of care      Therapist Electronic Signature:    Vinetta Bergamo, PTA, 01/19/2023 Start Time:  2:30 PM EST    End Time: 3:00   Timed Minutes:  30  Untimed Minutes:   Total Treatment Time: 30   Time Breakdown:  Modalities:   MT:     TE: 30    TA:    GT:    NMRE

## 2023-01-21 ENCOUNTER — Inpatient Hospital Stay: Admit: 2023-01-21 | Payer: MEDICARE | Primary: Family

## 2023-01-21 NOTE — Progress Notes (Signed)
Provider Number [id number]  Physical Therapy Treatment Note  Patient: Barbara Elliott (72 y.o. 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 No         HEP Compliance: sometimes      Pain Level:  0/10      Patient Comments:   just the knee. Hip tired from cleaning house today  Condition improvement: improved     Objective:    Restrictions/Precautions: Restrictions/Precautions: Surgical protocol; Other (comment) (osteoporosis)            Treatment:      Therapeutic Exercise:     Sci Fit L 5 x 5 min  B LE Leg Press (4) 55# x 10 x 2; Uni L 30# 2x10  YTB LAQ L 2x10  Seated hip IR YTB 2x10  Standing hip ext YTB 2x10  Side step YTB 3 laps plinth  Seated HS st, standing hip flexor st on chair , seated hip ER stretch 30 sec each    Neuromuscular Reeducation:   SLS L min UE A 3 x 15s  Tandem 30 s B  Heel toe rock no UE A x 10    Patient/ Caregiver Education and Instruction:   self care, activity modification, and exercises ; POC    Home Exercise Program:  Issue above ex at next visit    Assessment:    Post-Treatment Pain Level:   feels worked  Patient's Activity Tolerance:     good  tolerated the activities well with no complications.  Overall progress is considered to be: Good   Patient will continue to benefit from skilled  PT services.     Conditions requiring continued skilled PT services: Body Structures, Functions, Activity Limitations Requiring Skilled Therapeutic Intervention: Decreased functional mobility ; Decreased ADL status; Decreased ROM; Decreased strength; Decreased endurance; Decreased balance; Increased pain      Factors which may impact rehabilitation potential include:       Plan:   Continue Therapy current plan of care  x 2 visits    Therapist Electronic Signature:    Derryck Shahan A Coni Homesley, PT, 01/21/2023 Start Time:  2:45 PM EST    End Time: 3:15 PM   Timed Minutes:  30  Untimed Minutes: 0  Total Treatment Time: 30   Time Breakdown:  Modalities: 0  MT: 0    TE: 25    TA: 0   GT: 0   NMRE 5

## 2023-01-25 ENCOUNTER — Encounter: Payer: MEDICARE | Primary: Family

## 2023-01-25 NOTE — Progress Notes (Signed)
Provider Number [id number]  Physical Therapy Treatment Note  Patient: Barbara Elliott (72 y.o. 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 No         HEP Compliance: Yes      Pain Level:  NR      Patient Comments:   sore all over for about 2 days then felt great  Condition improvement: improved     Objective:    Restrictions/Precautions: Restrictions/Precautions: Surgical protocol; Other (comment) (osteoporosis)          Treatment:      Therapeutic Exercise:     Sci Fit L 5 x 5 min  B LE Leg Press (4) 55# x 10 x 2; Uni L 30# 2x10  YTB LAQ B 2x10  Seated hip IR YTB 2x10 B  Standing hip ext YTB 2x10 B  Side step YTB 3 laps plinth  Seated HS st, standing hip flexor st on chair , seated hip ER stretch 30 sec each     Neuromuscular Reeducation:   SLS L min UE A 3 x 15s  Tandem 30 s B  Heel toe rock no UE A x 10    Patient/ Caregiver Education and Instruction:   self care, activity modification, and exercises     Home Exercise Program: New HEP issued, HEP was progressed or revised, Written handout , and scanned copy of written HEP can be seen in the EMR.    Assessment:    Post-Treatment Pain Level:   0/10  Patient's Activity Tolerance:     good  tolerated the activities well with no complications.  Overall progress is  considered to be: Good   Patient will continue to benefit from skilled PT services.     Conditions requiring continued skilled PT services: Body Structures, Functions, Activity Limitations Requiring Skilled Therapeutic Intervention: Decreased functional mobility ; Decreased ADL status; Decreased ROM; Decreased strength; Decreased endurance; Decreased balance; Increased pain      Factors which may impact rehabilitation potential include:       Plan:   Continue Therapy current plan of care  x 1 visit then dc to HEP    Therapist Electronic Signature:    Ever Halberg A Jeno Calleros, PT, 01/25/2023 Start Time: 9:20 AM    End Time: 9:54 AM   Timed Minutes:  34  Untimed Minutes: 0  Total Treatment Time: 34   Time Breakdown:  Modalities: 0  MT: 0    TE: 29    TA: 0   GT: 0   NMRE 5

## 2023-01-28 ENCOUNTER — Encounter: Payer: MEDICARE | Primary: Family

## 2023-01-28 DIAGNOSIS — Z9889 Other specified postprocedural states: Secondary | ICD-10-CM

## 2023-01-28 NOTE — Progress Notes (Signed)
Provider Number [phone]  Physical Therapy Treatment note and Discharge Summary  Patient: Barbara Elliott (72 y.o. female)    DOB:  October 19, 1951  Treatment Date: 70/62/3762  Medicare Cert period if applicable:   Plan of Care/Certification Expiration Date: 02/23/23     Referring Provider: Raliegh Ip, MD  Sec Prov:   Start of Care Date: 11/25/2022   Insurance: Payor: [facility] MEDICARE / Plan: [facility] MEDICARE / Product Type: *No Product type* /   Consulting civil engineer (if applicable):  Visits from Start of Care:  15     Appointment CX/NS History:  No data recorded Progress Note Due:  Progress Note Due Date: 02/04/23        Medical Diagnosis: Presence of left artificial hip joint [Z96.642]  Treatment Diagnosis:   Treatment Diagnosis: L hip mobility and muscle power deficits s/p THA    Problem Onset date:   Onset Date: 11/03/22       Subjective:     Any medical or medication changes? No          HEP Compliance: Yes        Pain Level:  0/10         Patient Comments:   Feels good, no questions about exercises at home  Condition improvement: improved    Objective:    Restrictions/Precautions: Restrictions/Precautions: Surgical protocol; Other (comment) (osteoporosis)        Observations/Activities, tests and measures:   Continued right lateral trunk lean due to right knee more than left hip  SLS L 1st attempt 4 sec, 2nd attempt 7 sec    Left AROM  Left PROM       WFL - continued difficulty with ER seated     WFL all planes without pain          Left Strength  Right Strength          Hip abd 4-/5  Hip ext 4/5            Outcome Measure(s) Completed:   Lower Extremity Functional Scale (LEFS)  Any of your usual work, housework, or school activities: No Difficulty  Your usual hobbies, recreational, or sporting activities: A Little Bit of Difficulty  Getting into or out of the bath: No Difficulty  Walking between rooms: No Difficulty  Putting on your shoes or socks: No Difficulty  Squatting: Moderate Difficulty  Lifting an  object, like a bag of groceries from the floor: A Little Bit of Difficulty  Performing light activities around your home: No Difficulty  Performing heavy activities around your home: A Little Bit of Difficulty  Getting into or out of a car: No Difficulty  Walking 2 blocks: Moderate Difficulty  Walking a mile: Moderate Difficulty  Going up or down 10 stairs (about 1 flight of stairs): A Little Bit of Difficulty  Standing for 1 hour: A Little Bit of Difficulty  Sitting for 1 hour: No Difficulty  Running on even ground : Quite a Bit of Difficulty  Running on uneven ground : Extreme Difficulty or Unable to Perform Activity  Making sharp turns while running fast : Extreme Difficulty or Unable to Perform Activity  Hopping: Extreme Difficulty or Unable to Perform Activity  Rolling over in bed: No Difficulty  LEFS Total Score: 54  LEFS Disability Index: 20-39%  LEFS CMS Modifier: CJ     Current Total Score: 54 / 80 (Date: 01/28/2023)    Interpretation of Score: 20 questions each scored on a 5-point scale with 0 representing "  extreme difficulty or unable to perform" and 4 representing "no difficulty".  The lower the score, the greater the functional disability. 80/80 represents no disability.  Minimal detectable change is 9 points.    Treatment:      Therapeutic Exercise:     Sci Fit L 5 x 5 min  B LE Leg Press (4) 55# x 10 x 2; Uni L 30# 2x10  Rotary hip flex 12.5# x 12 B, Ext 25# x 10 B   Seated Hip ABD 20# x 15 x 2     Neuromuscular Reeducation:    SLS w/ Weight toe tap fwd, bk 10x ea side  Semi tandem on foam 2x30" ea  SLS flat ground fingertip assist     Patient/ Caregiver Education and Instruction:   self care, activity modification, and exercises     Home Exercise Program: HEP was reviewed with Patient    Assessment:    Post Treatment Pain level:   0/10  Patient's Activity Tolerance:     good    Overall goal progress is considered to be: Good   Patient will not   continue to benefit from skilled PT services.    Conditions requiring continued skilled PT services: Body Structures, Functions, Activity Limitations Requiring Skilled Therapeutic Intervention: Decreased functional mobility ; Decreased ADL status; Decreased ROM; Decreased strength; Decreased endurance; Decreased balance; Increased pain    Overall Goal Update:  partially met       Short Term Goals:  by 6 weeks  Pt. Will be indep and compliant with HEP for optimal post op outcome- MET  Pt. To tol progressive stretching , strengthening and nmre without worsening symptoms.- MET  Long Term Goals:  by 12 weeks  Pt. Will improve L hip ER A/PROM to WNL for crossing legs to donn/ doff shoes and socks. - progressing , stiff able to obtain position  Pt. Will improve L hip abd / ext strength to at least 4/5 for reciprocal stair climbing . - steady progress- continued hip abd weakness  Pt. Will improve L SLS to > 5 sec for decreased fall risk amb without AD. - appraoching              Patient's rehabilitation potential/prognosis: is considered to be: Therapy Prognosis: Good    Factors which may impact rehabilitation potential include: Barriers impacting rehab : Medical co-morbidities         Treatment Plan       Patient has been discharged to an independent HEP      Therapist Electronic Signature:    Souleymane Saiki A Vaudine Dutan, PT, 01/28/2023 Start Time:  9:30 AM EST      End Time: 10:00 AM                                             Timed Minutes:  30  Untimed Minutes: 0  Total Treatment Time: 30   Time Breakdown:  Modalities: 0  MT: 0    TE: 30    TA: 0   GT: 0   NMRE 0

## 2023-02-01 ENCOUNTER — Ambulatory Visit: Admit: 2023-02-01 | Discharge: 2023-02-01 | Payer: MEDICARE | Attending: Family | Primary: Family

## 2023-02-01 DIAGNOSIS — Z4789 Encounter for other orthopedic aftercare: Secondary | ICD-10-CM

## 2023-02-01 NOTE — Progress Notes (Signed)
CC: Status Post Total Hip Replacement     HPI:  The patient is a 72 y.o.-year-old female who presents today 3 months status post left total hip arthroplasty. The patient has completed formal physical therapy, she participates in a home exercise program. Pain is currently 0/10. The patient denies fevers, chills or night sweats. There has been no drainage from the wound.     She is very pleased with the outcome of her surgery.     ROS: Musculoskeletal and general systems were reviewed and are negative except as above in the HPI.     Examination: Ht 1.562 m (5' 1.5")   Wt 68 kg (150 lb)   BMI 27.88 kg/m   The patient is awake, alert and oriented times 3 and answering questions appropriately. Affect is friendly and breathing is unlabored.     Examination of bilateral lower extremities reveals neurovascularly intact limbs. Skin is cool dry and intact left hip without gross deformity. There is no tenderness. Hip range of motion is full in all planes and equal to the contralateral side. There is full ROM of the knee and ankle as well. There is no discomfort with ROM. There is 5 out of 5 muscle strength throughout bilateral lower extremities. Negative logroll and negative heel strike. Bilateral lower extremities are motor and sensory intact throughout. No LE edema.     Assessment: 3 months Status Post Total hip arthroplasty, Orthopedic Aftercare    Plan:  At this point the patient is doing well and making good progress recovering from total hip replacement. The patient may progressively return to activities as tolerated. Continue with home exercise program. Followup 9 months with x-ray We discussed pre-dental antibiotics.     The patient understands and agrees with the plan.

## 2023-02-03 ENCOUNTER — Ambulatory Visit: Payer: MEDICARE | Attending: Family | Primary: Family

## 2023-04-27 ENCOUNTER — Encounter

## 2023-04-28 ENCOUNTER — Inpatient Hospital Stay: Admit: 2023-04-28 | Payer: MEDICARE | Attending: Family | Primary: Family

## 2023-04-28 DIAGNOSIS — Z1231 Encounter for screening mammogram for malignant neoplasm of breast: Secondary | ICD-10-CM

## 2023-04-30 ENCOUNTER — Ambulatory Visit: Payer: MEDICARE | Attending: Family | Primary: Family

## 2023-05-20 ENCOUNTER — Inpatient Hospital Stay: Admit: 2023-05-20 | Payer: MEDICARE | Primary: Family

## 2023-05-20 DIAGNOSIS — Z0000 Encounter for general adult medical examination without abnormal findings: Secondary | ICD-10-CM

## 2023-05-20 LAB — LIPID PANEL
Chol/HDL Ratio: 3
Cholesterol, Total: 236 MG/DL — ABNORMAL HIGH (ref 0–199)
HDL: 79 MG/DL (ref >50)
LDL Cholesterol: 121.4 MG/DL
Non-HDL Cholesterol: 157 mg/dL
Triglycerides: 178 MG/DL — ABNORMAL HIGH (ref <150)

## 2023-05-20 LAB — CBC WITH AUTO DIFFERENTIAL
Basophils %: 1 % (ref 0–2)
Basophils Absolute: 0.1 10*3/uL (ref 0.0–0.1)
Eosinophils %: 4 % (ref 0–5)
Eosinophils Absolute: 0.4 10*3/uL (ref 0.0–0.4)
Hematocrit: 42.5 % (ref 37.0–47.0)
Hemoglobin: 14.6 g/dL (ref 12.0–16.0)
Immature Granulocytes %: 0 % (ref 0.0–0.6)
Immature Granulocytes Absolute: 0 10*3/uL (ref 0.00–0.04)
Lymphocytes Absolute: 2.6 10*3/uL (ref 1.2–3.7)
Lymphocytes: 26 % (ref 14–46)
MCH: 32.7 PG — ABNORMAL HIGH (ref 27.0–31.0)
MCHC: 34.4 g/dL (ref 33.0–37.0)
MCV: 95.1 FL — ABNORMAL HIGH (ref 80.0–94.0)
MPV: 9.8 FL (ref 7.4–10.4)
Monocytes %: 12 % (ref 5–12)
Monocytes Absolute: 1.2 10*3/uL — ABNORMAL HIGH (ref 0.2–1.0)
Neutrophils Absolute: 5.7 10*3/uL (ref 1.6–6.1)
Nucleated RBCs: 0 PER 100 WBC
Platelets: 359 10*3/uL (ref 130–400)
RBC: 4.47 M/uL (ref 4.20–5.40)
RDW: 13.1 % (ref 11.5–14.5)
Seg Neutrophils: 57 % (ref 47–80)
WBC: 10 10*3/uL (ref 4.5–10.9)
nRBC: 0 10*3/uL

## 2023-05-20 LAB — COMPREHENSIVE METABOLIC PANEL W/ REFLEX TO MG FOR LOW K
ALT: 31 U/L (ref 12–78)
AST: 24 U/L (ref 10–37)
Albumin: 4.2 g/dL (ref 3.4–5.0)
Alk Phosphatase: 91 U/L (ref 43–117)
BUN: 18 MG/DL (ref 7–22)
CO2: 26 mmol/L (ref 21–32)
Calcium: 10 MG/DL (ref 8.5–10.1)
Chloride: 105 mmol/L (ref 98–108)
Creatinine: 0.92 MG/DL (ref 0.55–1.10)
Est, Glom Filt Rate: 66 mL/min/{1.73_m2} (ref >60)
Glucose: 107 mg/dL — ABNORMAL HIGH (ref 74–106)
Potassium: 4.2 mmol/L (ref 3.4–5.1)
Sodium: 138 mmol/L (ref 136–145)
Total Bilirubin: 0.6 mg/dL (ref 0.00–1.00)
Total Protein: 8.2 g/dL (ref 6.4–8.2)

## 2023-05-20 LAB — THYROID CASCADE PROFILE: TSH, 3rd Generation: 2.26 u[IU]/mL (ref 0.358–3.740)

## 2023-05-20 LAB — VITAMIN D 25 HYDROXY: Vit D, 25-Hydroxy: 57.3 ng/mL

## 2023-05-22 LAB — HEMOGLOBIN A1C: Hemoglobin A1C: 5.4 % (ref 4.0–5.6)

## 2023-05-25 ENCOUNTER — Ambulatory Visit: Admit: 2023-05-25 | Discharge: 2023-05-25 | Payer: MEDICARE | Attending: Family | Primary: Family

## 2023-05-25 DIAGNOSIS — M25561 Pain in right knee: Secondary | ICD-10-CM

## 2023-05-25 DIAGNOSIS — Z0000 Encounter for general adult medical examination without abnormal findings: Secondary | ICD-10-CM

## 2023-05-25 MED ORDER — AMLODIPINE BESYLATE 10 MG PO TABS
10 MG | ORAL_TABLET | Freq: Every evening | ORAL | 3 refills | Status: DC
Start: 2023-05-25 — End: 2024-06-13

## 2023-05-25 NOTE — Progress Notes (Signed)
[facility]   [address]  [place] [address]    MEDICARE ANNUAL WELLNESS VISIT       CHIEF COMPLAINT   Barbara Elliott is a 72 y.o. female who presents today for Medicare AWV     HISTORY OF PRESENT ILLNESS     Patient is here for awv.  Completed cologuard-   Mammogram completed 03/2023    She states she has been having trouble with the right knee.  Intermittent pain and stability.  Exercising the knee and tolerating it.  She states using cane on uneven surfaces.-   No recent falls or injuries.    HYPERTENSION:  takes amlodipine in the evening, takes metoprolol in the morning. Does feel lt could be maybe old cuff.   She reports good medication compliance and no side effects.   Home blood pressure monitoring is being performed and the systolic bp is running in the 409 to 155 range.  Chest pain:  no  Dizziness:  no  New or worsening leg edema:  no    BP Readings from Last 3 Encounters:   05/25/23 126/68   01/18/23 (!) 144/82   11/25/22 (!) 142/80     Lab Results   Component Value Date    K 4.2 05/20/2023    CREATININE 0.92 05/20/2023    NA 138 05/20/2023      Hypertension Medications       Calcium Channel Blockers       amLODIPine (NORVASC) 10 MG tablet Take 1 tablet by mouth every evening       Beta Blockers Cardio-Selective       metoprolol succinate (TOPROL XL) 25 MG extended release tablet TAKE 1 TABLET BY MOUTH EVERY DAY FOR BLOOD PRESSURE     Patient taking differently: Take 1 tablet by mouth daily                 Thyroid Hx:   She continues on thyroid supplementation without any side effects.      Fatigue:  no  Bowel changes:  no  Skin changes:  no   TSH, 3rd Generation (uIU/mL)   Date Value   05/20/2023 2.260   06/24/2022 1.450   04/30/2022 4.040 (H)     T4 Free (ng/dL)   Date Value   81/19/1478 0.8   03/25/2021 0.8          CHOLESTEROL:  takes statin therapy every other day. Doesn't give her pain now.   Current medication: atorvastatin - 40 MG   She  is taking lipid lower medication  but not consistent  Cardiac Risk Factors include:  Hypertension  The 10-year ASCVD risk score (Arnett DK, et al., 2019) is: 16.8%    Values used to calculate the score:      Age: 89 years      Sex: Female      Is Non-Hispanic African American: No      Diabetic: No      Tobacco smoker: No      Systolic Blood Pressure: 126 mmHg      Is BP treated: Yes      HDL Cholesterol: 79 MG/DL      Total Cholesterol: 236 MG/DL   Lab Results   Component Value Date    LDL 121.4 05/20/2023    LDL 80.2 04/30/2022    HDL 79 05/20/2023    HDL 66 04/30/2022    TRIG 295 (H) 05/20/2023    TRIG 234 (H)  04/30/2022    ALT 31 05/20/2023          Reviewed on today's visit patient's past medical/surgical history, family/social history, medications allergies, previous lab work and imaging studies immunizations and preventive care    PHYSICAL EXAM     General:  Pleasant, well-dressed and groomed, good historian  Eyes: PERRLA, non-injected  Ears: tympanic membranes are non-erythematous and in the neutral position, EAM without erythema or discharge  Nose: patent  Oropharynx: oral mucosa moist without lesions, no pharyngeal injection, tonsillar hypertrophy or exudate  Neck: no adenopathy, thyromegaly, masses, or bruits  Heart: regular rate and rhythm without murmur  Lungs: clear to auscultation  Abdomen: soft, nontender, no masses or organomegaly  Extremities: no edema, pulses are normal  Musculoskeletal:  No joint swelling or tenderness. Normal gait.  Neurologic:  CNII-XII intact. Normal strength, sensation and reflexes throughout.  Skin:  No rash or suspicious skin lesions    MEDICATIONS     Current Outpatient Medications   Medication Sig    Omega-3 Fatty Acids (FISH OIL) 1200 MG CAPS Take by mouth    amLODIPine (NORVASC) 10 MG tablet Take 1 tablet by mouth every evening    aspirin 81 MG EC tablet Take 1 tablet by mouth daily morning    acetaminophen (TYLENOL) 500 MG tablet Take 2 tablets by mouth every 6 hours as needed for Pain    levothyroxine  (SYNTHROID) 25 MCG tablet TAKE 1 TABLET BY MOUTH EVERY DAY    atorvastatin (LIPITOR) 40 MG tablet TAKE 1 TABLET BY MOUTH EVERY DAY (Patient taking differently: Take 1 tablet by mouth every evening)    metoprolol succinate (TOPROL XL) 25 MG extended release tablet TAKE 1 TABLET BY MOUTH EVERY DAY FOR BLOOD PRESSURE (Patient taking differently: Take 1 tablet by mouth daily)    Multiple Vitamin (MULTIVITAMIN ADULT PO) Take 1 tablet by mouth daily    Cholecalciferol 50 MCG (2000 UT) TABS Take 1 tablet by mouth daily    glucosamine-chondroitin 500-400 MG CAPS Take 1 capsule by mouth daily     No current facility-administered medications for this visit.     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 Marcille Buffy    OTHER SURGICAL HISTORY  2013    pelvic floor, mesh and raise bladder with hysterectomy    TOTAL HIP ARTHROPLASTY Left 11/03/2022    HIP TOTAL ARTHROPLASTY ANTERIOR APPROACH performed by  Irena Reichmann, MD at [facility] MAIN OR     SOCIAL HISTORY     Social History     Social History Narrative    y day.  Her husband does smoke cigars.  She does not use illicit drugs.  She does drink wine 1-2 glasses at night.    She enjoys her time with grand children, reading.    Wears a seat belt in a car.          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 Married to [employer].  4 children together:  - Sean 1970   -Adam 1972 Grand child- 1 boy  -Carrie 1975 Grand child -1 girl  -[name] [phone] - 3 girls    She does currently smoke cigarettes - 1/2 a pack a week, not ever     FAMILY HISTORY     Family History   Problem Relation Age of Onset    Other Sister         prediabetic    Obesity Sister     Colon Cancer Sister 30        colostomy    Cancer Sister         colon    High Blood Pressure Brother     Mastectomy Maternal Grandmother 22        breast cancer    No Known Problems Maternal Grandfather     No Known Problems Paternal Grandmother     Diabetes Paternal Grandfather     Osteoarthritis Brother 60        2 knee replacement    High Blood Pressure Brother     No Known Problems Brother     Hypertension Mother     Diabetes Father     Uterine Cancer Neg Hx     Ovarian Cancer Neg Hx      VITALS     Vitals:    05/25/23 1251   BP: 126/68   Site: Left Upper Arm   Position: Sitting   Cuff Size: Medium Adult   Pulse: 83   SpO2: 98%   Weight: 68.2 kg (150 lb 6.4 oz)   Height: 1.562 m (5' 1.5")    - Body mass index is 27.96 kg/m.    SCREENING  QUESTIONNAIRES     STOP-BANG (3+ high risk)        No data to display                 DEPRESSION SCREENING  Severity:  1-4 low risk; 5-9 mild depressive symptoms; 10-14 mild major depression; 15-19 moderate major depression; 20-27 severe major depression      05/21/2023    10:46 AM   PHQ-9    Little interest or pleasure in doing things 0   Feeling down, depressed, or hopeless 0   PHQ-2 Score 0   PHQ-9 Total Score 0        MEDICARE ANNUAL WELLNESS VISIT HEALTH RISK ASSESSMENT & PLAN      Recommendations for Preventive Services Due: see orders and patient instructions/AVS.   Recommended screening schedule for the next 5-10 years is provided to the patient in written form: see Patient Instructions/AVS.   Patient's complete Health Risk Assessment and screening values have been reviewed  and are found in Flowsheets. The following problems were reviewed today and where indicated follow up appointments were made and/or referrals ordered.     Positive Risk Factor Screenings with Interventions:  Fall Risk:  Do you feel unsteady or are you worried about falling? : (!) yes  2 or more falls in past year?: no  Fall with injury in past year?: no     Interventions:    Reviewed medications, home hazards, visual acuity, and co-morbidities that can increase risk for falls       Interventions:  Patient comments: quit smoking 09/27/2023    Tobacco Use Counseling: Patient was counseled on tobacco cessation. Based upon patient's motivation to change her behavior, the following plan was agreed upon:  patient has quit 09/27/2023 .       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    8 or more is associated with harmful or hazardous drinking.   13 or more in women, and 15 or more in men, is likely to indicate alcohol dependence.  Interventions:  Patient comments: wine nightly- 2 glasses per night, no falls, injuries, no confusion    Alcohol Misuse Counseling: Patient was asked about her current pattern of alcohol consumption, and results of screening for alcohol misuse, performed within the past 12 months, have been consistent with harmful or hazardous drinking, but do not meet criteria for alcohol dependence. Patient's individual health  risks associated with alcohol misuse were discussed, as well as the likely benefits of abstinence or cutting down. Reviewed recommended alcohol consumption guidelines with patient. Based upon patient's motivation to change her behavior, the following plan was agreed upon:  does not feel it is a problem . Educational materials to reinforce counseling were provided. Patient will follow-up in 7 day(s) with PCP. Provider spent <10  minutes counseling patient.           Safety:  Do you have any tripping hazards - loose or unsecured carpets or rugs?: (!) Yes  Interventions:  Patient comments: uses a cane-      Sexual Health: She  reports that she is not currently sexually active and has had partner(s) who are female. She reports using the following method of birth control/protection: Surgical.         CareTeam (Including outside providers/suppliers regularly involved in providing care):   Patient Care Team:  Ruthine Dose, APRN - NP as PCP - General    ASSESSMENT AND PLAN     1. Encounter for subsequent annual wellness visit (AWV) in Medicare patient  2. Preventative health care  Assessment & Plan:  Addressed her care gaps today, including USPSTF recommended screenings and ACIP recommended immunizations.     3. Primary hypertension  Assessment & Plan:  Recommendations: eating a healthy diet, low-sodium intake (<1.5gm/day), and regular aerobic exercise (150 minutes or more per week).  Encouraged home blood pressure monitoring, and follow up if systolic blood pressure is over 140 or diastolic pressure is over 90 consistently.  Plan and medication management: Blood pressure is normotensive - continue current treatment and no change needed.  4. Chronic pain of right knee  Assessment & Plan:  Rest, ice, heat  Support with mobility  Monitor for worsening symptoms  Follow up PT if symptoms persist.   Orders:  -     XR KNEE RIGHT (3 VIEWS); Future  5. Subclinical hypothyroidism  Assessment & Plan:  Reviewed thyroid  cascade and the TSH is within the euthymic range.     Plan and medication management:  Continue thyroid supplementation at the same dosage.   Recheck thyroid cascade and follow-up: in one year.     6. At high risk for falls  Assessment & Plan:  Completed PT- declines further PT  Uses cane with ambulation  Is slow moving and takes her time  Tries to exercise when able.   Slow stairs  No further intervention at this time   7. Vitamin D deficiency  Assessment & Plan:  Reviewed vitamin-D level with her which is in the normal range  Plan and medication management:  no change, continue on the same medication and dose     8. Encounter for hepatitis C screening test for low risk patient  Assessment & Plan:  Completed non reactive.       Follow up:  Return in about 6 months (around 11/25/2023) for follow up.     Future Appointments   Date Time Provider Department Center   06/01/2023 11:00 AM AMA NURSE [facility]   11/08/2023 11:15 AM [facility] XR 1 [facility]   11/08/2023 11:30 AM Ranell Patrick, APRN - NP ORTHO [facility] AMB   11/30/2023 10:30 AM Ruthine Dose, APRN - NP [facility] AMB   05/29/2024  1:00 PM Ruthine Dose, APRN - NP [facility] AMB       Ritisha Deitrick [place] Crown, APRN - NP  05/25/2023

## 2023-05-27 NOTE — Assessment & Plan Note (Signed)
Reviewed thyroid cascade and the TSH is within the euthymic range.     Plan and medication management:  Continue thyroid supplementation at the same dosage.   Recheck thyroid cascade and follow-up: in one year.

## 2023-05-27 NOTE — Assessment & Plan Note (Signed)
Rest, ice, heat  Support with mobility  Monitor for worsening symptoms  Follow up PT if symptoms persist.

## 2023-05-27 NOTE — Assessment & Plan Note (Signed)
Completed non reactive

## 2023-05-27 NOTE — Assessment & Plan Note (Signed)
Recommendations: eating a healthy diet, low-sodium intake (<1.5gm/day), and regular aerobic exercise (150 minutes or more per week).  Encouraged home blood pressure monitoring, and follow up if systolic blood pressure is over 140 or diastolic pressure is over 90 consistently.  Plan and medication management: Blood pressure is normotensive - continue current treatment and no change needed.

## 2023-05-27 NOTE — Assessment & Plan Note (Signed)
Reviewed vitamin-D level with her which is in the normal range  Plan and medication management:  no change, continue on the same medication and dose

## 2023-05-27 NOTE — Assessment & Plan Note (Signed)
Addressed her care gaps today, including USPSTF recommended screenings and ACIP recommended immunizations.

## 2023-05-27 NOTE — Assessment & Plan Note (Signed)
Completed PT- declines further PT  Uses cane with ambulation  Is slow moving and takes her time  Tries to exercise when able.   Slow stairs  No further intervention at this time

## 2023-05-28 ENCOUNTER — Encounter

## 2023-05-28 MED ORDER — LEVOTHYROXINE SODIUM 25 MCG PO TABS
25 MCG | ORAL_TABLET | Freq: Every day | ORAL | 1 refills | Status: DC
Start: 2023-05-28 — End: 2024-02-21

## 2023-05-28 NOTE — Telephone Encounter (Signed)
Name from pharmacy: LEVOTHYROXINE 25 MCG TABLET          Will file in chart as: levothyroxine (SYNTHROID) 25 MCG tablet    Sig: TAKE 1 TABLET BY MOUTH EVERY DAY    Disp: 90 tablet    Refills: 1    Start: 05/28/2023    Class: Normal    Non-formulary For: Subclinical hypothyroidism    Last ordered: 6 months ago (11/26/2022) by Ruthine Dose, APRN - NP    Last refill: 02/27/2023    Rx #: [id number]    Thyroid Hormones Refill Checklist  Passed05/31/2024 01:57 AM   Protocol Details Last TSH normal within the past 12 months    Visit with authorizing provider in past 9 months or upcoming 90 days          Per protocol, rx sent

## 2023-06-01 ENCOUNTER — Ambulatory Visit: Admit: 2023-06-01 | Discharge: 2023-06-01 | Payer: MEDICARE | Primary: Family

## 2023-06-01 DIAGNOSIS — I10 Essential (primary) hypertension: Secondary | ICD-10-CM

## 2023-06-01 NOTE — Progress Notes (Signed)
Pt to office for b/p check left arm 122 /2    Pt b/p cuff 155/94    Pt denies dizziness , blurred vision and headache      Pt states that she is going to get a new b/p cuff

## 2023-08-12 NOTE — Telephone Encounter (Signed)
Can you follow up on this request? We are still looking for her colonoscopy report

## 2023-08-12 NOTE — Telephone Encounter (Signed)
Per note from 07/16/21:     Note      Called and spoke with Pt.  Scheduled NV for BP check for 07/21/21 at 11:00 am.     Review and consult with back office PSR for medical records, reveals records have not been rec'd yet.  ROI printed and faxed as second medical records request, with a note for urgent request for colonoscopy report.     Thank you,  Beverlee Nims

## 2023-08-13 NOTE — Telephone Encounter (Signed)
Called and spoke to the patient. She stated she will look through her paperwork for the past colonoscopy records.  She stated if she is unable to locate past records and send them to [place] she will call us with the name and number of the facility she had them completed at so we can request records from them.

## 2023-08-26 ENCOUNTER — Encounter

## 2023-08-26 NOTE — Telephone Encounter (Signed)
 Name from pharmacy: LEVOTHYROXINE  25 MCG TABLET          Will file in chart as: levothyroxine  (SYNTHROID ) 25 MCG tablet    Sig: TAKE 1 TABLET BY MOUTH EVERY DAY    Disp: 90 tablet    Refills: 1    Start: 08/26/2023    Class: Normal    Non-formulary For: Subclinical hypothyroidism    Last ordered: 3 months ago (05/28/2023) by Greig Almarie Cocking, APRN - NP    Last refill: 08/22/2023    Rx #: [id number]    Thyroid Hormones Refill Checklist  Passed08/29/2024 12:00 PM   Protocol Details Last TSH normal within the past 12 months    Visit with authorizing provider in past 9 months or upcoming 90 days          Pt has refill at pharmacy

## 2023-09-30 ENCOUNTER — Encounter

## 2023-09-30 NOTE — Telephone Encounter (Signed)
 Pt name and DOB verified.    Pt calling, Pt stating that she developed symptoms yesterday and then tested COVID positive, Pt inquiring if Amy wants to prescribe Paxlovid or for the Pt to wait it out. Pt requesting a callback.    [phone] (home)

## 2023-09-30 NOTE — Telephone Encounter (Signed)
 This encounter was created in error - please disregard.

## 2023-10-04 MED ORDER — ATORVASTATIN CALCIUM 40 MG PO TABS
40 | ORAL_TABLET | Freq: Every day | ORAL | 3 refills | 90.00000 days | Status: DC
Start: 2023-10-04 — End: 2024-09-08

## 2023-10-04 MED ORDER — METOPROLOL SUCCINATE ER 25 MG PO TB24
25 | ORAL_TABLET | ORAL | 3 refills | Status: AC
Start: 2023-10-04 — End: ?

## 2023-10-04 NOTE — Telephone Encounter (Signed)
Name from pharmacy: METOPROLOL SUCC ER 25 MG TAB         Will file in chart as: metoprolol succinate (TOPROL XL) 25 MG extended release tablet    Sig: TAKE 1 TABLET BY MOUTH EVERY DAY FOR BLOOD PRESSURE    Disp: 90 tablet    Refills: 3    Start: 10/03/2023    Class: Normal    Non-formulary    Last ordered: 12 months ago (10/07/2022) by Ruthine Dose, APRN - NP    Last refill: 07/10/2023    Rx #: [id number]    Beta-Blockers Protocol Passed10/05/2023 08:47 AM   Protocol Details Last Pulse reading greater than 50 recorded within past year    Visit with authorizing provider in past 9 months or upcoming 90 days       Name from pharmacy: ATORVASTATIN 40 MG TABLET         Will file in chart as: atorvastatin (LIPITOR) 40 MG tablet    Sig: TAKE 1 TABLET BY MOUTH EVERY DAY    Disp: 90 tablet    Refills: 3    Start: 10/03/2023    Class: Normal    Non-formulary    Last ordered: 12 months ago (10/07/2022) by Ruthine Dose, APRN - NP    Last refill: 07/04/2023    Rx #: [id number]    Hmg CoA Reductase Inhibitors Protocol Passed10/05/2023 08:47 AM   Protocol Details Last Lipid panel resulted within the past 12 months    Visit with authorizing provider in past 9 months or upcoming 90 days    Last AST and ALT levels < or = to 3x normal within the past 3 years        Future Appointments   Date Time Provider Department Center   11/08/2023 11:15 AM [facility] [place] [facility]   11/08/2023 11:30 AM Ranell Patrick, APRN - NP [facility]   11/30/2023 10:30 AM Ruthine Dose, APRN - NP [facility]   05/29/2024  1:00 PM Ruthine Dose, APRN - NP [facility]

## 2023-11-05 ENCOUNTER — Encounter

## 2023-11-08 ENCOUNTER — Ambulatory Visit: Admit: 2023-11-08 | Discharge: 2023-11-08 | Payer: MEDICARE | Attending: Family | Primary: Family

## 2023-11-08 ENCOUNTER — Inpatient Hospital Stay: Admit: 2023-11-08 | Discharge: 2023-11-08 | Payer: MEDICARE | Primary: Family

## 2023-11-08 VITALS — Ht 61.0 in | Wt 150.0 lb

## 2023-11-08 DIAGNOSIS — Z4789 Encounter for other orthopedic aftercare: Secondary | ICD-10-CM

## 2023-11-08 DIAGNOSIS — Z96642 Presence of left artificial hip joint: Secondary | ICD-10-CM

## 2023-11-08 NOTE — Progress Notes (Signed)
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 The patient is doing well. Pain is currently 1/10.The patient denies fevers, chills or night sweats. There has been no drainage from the wound.     ROS: Musculoskeletal and general systems were reviewed and are negative except as above in the HPI.     Examination: Ht 1.549 m (5\' 1" )   Wt 68 kg (150 lb)   BMI 28.34 kg/m   The patient is awake, alert and oriented times 3 and answering questions appropriately. Affect is friendly and breathing is unlabored.     Examination of bilateral lower extremities reveals neurovascularly intact limbs. Skin is cool dry and intact. Left  hip without gross deformity. There is no tenderness. Hip range of motion is full in all planes and equal to the contralateral side. There is full ROM of the knee and ankle as well. There is no discomfort with ROM. There is 5 out of 5 muscle strength throughout bilateral lower extremities. Negative logroll and negative heel strike. Bilateral lower extremities are motor and sensory intact throughout. No LE edema.     Imaging: Xrays of left are reviewed, independently interpreted by myself, shared with the patient and show well fixed well aligned THA without loosening or hardware failure. 2 intact femur cables.     Assessment: 1 Year Status Post Total hip arthroplasty, Orthopedic Aftercare    Plan: At this point the patient is doing well and making good progress recovering from total hip replacement. The patient may continue activity as tolerated. Avoid high impact activity Continue with home exercise program. Followup every 3-5 years with x-ray.    The patient understands and agrees with the plan

## 2023-11-30 ENCOUNTER — Ambulatory Visit: Admit: 2023-11-30 | Payer: MEDICARE | Attending: Family | Admitting: Family | Primary: Family

## 2023-11-30 VITALS — BP 160/80 | HR 71 | Ht 61.5 in | Wt 153.4 lb

## 2023-11-30 DIAGNOSIS — Z0000 Encounter for general adult medical examination without abnormal findings: Principal | ICD-10-CM

## 2023-11-30 NOTE — Assessment & Plan Note (Signed)
 Discussed with patient not showering every day-  Discussed topical Aquaphor to areas of irritation  Monitor for improvement  Discussed oatmeal topical or water based

## 2023-11-30 NOTE — Assessment & Plan Note (Signed)
 Recommendations: eating a healthy diet, low-sodium intake (<1.5gm/day), and regular aerobic exercise (150 minutes or more per week).  Encouraged home blood pressure monitoring, and follow up if systolic blood pressure is over 140 or diastolic pressure is o

## 2023-11-30 NOTE — Progress Notes (Signed)
 [facility]   [address]  [place] [address]    CHIEF COMPLAINT   Barbara Elliott is a 72 y.o. female who presents today for follow-up of Follow-up    HISTORY OF PRESENT ILLNESS   Patient is here for follow up.    Right knee pai

## 2023-11-30 NOTE — Assessment & Plan Note (Signed)
 Xray due  Discussed complete  Referral placed to [facility] PT  If xray comes back abnormal and PT not indicated will refer to orthopedic.

## 2023-12-01 ENCOUNTER — Ambulatory Visit
Admit: 2023-12-01 | Discharge: 2023-12-02 | Disposition: A | Discharge status: Home / Self Care | Payer: MEDICARE | Source: Ambulatory Visit | Attending: Family | Admitting: Family | Primary: Family

## 2023-12-01 DIAGNOSIS — M25561 Pain in right knee: Principal | ICD-10-CM

## 2023-12-12 ENCOUNTER — Encounter

## 2023-12-15 NOTE — Telephone Encounter (Signed)
 Pt name and DOB verified.    Referred by: Ruthine Dose, APRN - NP     Referred for: Chronic pain of right knee   Primary osteoarthritis of right knee   Xrray at [facility] on 12/01/23    When patient calls back please schedule OV NI appt with EH/BK. Ple

## 2023-12-21 NOTE — Telephone Encounter (Signed)
 Pt name and DOB verified.    Mailed letter.    [phone] (home)

## 2023-12-24 NOTE — Telephone Encounter (Signed)
 Noted,x-rays 12/01/23

## 2023-12-24 NOTE — Telephone Encounter (Signed)
 Barbara Elliott  07/18/1951  Type of referral: internal/external/ ED       Internal   Who is the referring office and referring provider      [facility]  Were you seen in the ER (if yes, when and where)  no    Date of Injury     no known injury         What

## 2024-01-03 ENCOUNTER — Ambulatory Visit: Admit: 2024-01-03 | Payer: MEDICARE | Attending: Family | Admitting: Family | Primary: Family

## 2024-01-03 VITALS — Ht 61.5 in | Wt 153.0 lb

## 2024-01-03 DIAGNOSIS — M1711 Unilateral primary osteoarthritis, right knee: Principal | ICD-10-CM

## 2024-01-03 MED ORDER — LIDOCAINE HCL 1 % IJ SOLN
1 | Freq: Once | INTRAMUSCULAR | Status: AC
Start: 2024-01-03 — End: 2024-01-03
  Administered 2024-01-03: 17:00:00 4 mL via INTRA_ARTICULAR

## 2024-01-03 MED ORDER — METHYLPREDNISOLONE ACETATE 80 MG/ML IJ SUSP
80 | Freq: Once | INTRAMUSCULAR | Status: AC
Start: 2024-01-03 — End: 2024-01-03
  Administered 2024-01-03: 16:00:00 80 mg via INTRA_ARTICULAR

## 2024-01-03 NOTE — Addendum Note (Signed)
Addended by: Zadie Rhine on: 01/03/2024 11:45 AM     Modules accepted: Orders

## 2024-01-03 NOTE — Progress Notes (Signed)
 CC: right knee pain     HPI: The patient is a 73 y.o. female who presents today for evaluation of right knee pain referred by Roxanne Greig Norris*.  The patient states that the pain has been present for years.   It has worsened recently.  It is located mainly in the anterior knee.  There is no history of injury. There is instability of the knee. There is difficulty with weightbearing, stairs, prolonged walking. She is unable to kneel or squat  There is moderate swelling.  No recent illness, fevers, chills or night sweats.  She utilizes topicals and acetaminophen  for pain control.  She also participates in a home exercise program.    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    surgery Dec 31 2021    Wears glasses        Current Outpatient Medications:     metoprolol  succinate (TOPROL  XL) 25 MG extended release tablet, TAKE 1 TABLET BY MOUTH EVERY DAY FOR BLOOD PRESSURE, Disp: 90 tablet, Rfl: 3    atorvastatin  (LIPITOR) 40 MG tablet, TAKE 1 TABLET BY MOUTH EVERY DAY, Disp: 90 tablet, Rfl: 3    levothyroxine  (SYNTHROID ) 25 MCG tablet, TAKE 1 TABLET BY MOUTH EVERY DAY, Disp: 90 tablet, Rfl: 1    Omega-3 Fatty Acids (FISH OIL) 1200 MG CAPS, Take by mouth, Disp: , Rfl:     amLODIPine  (NORVASC ) 10 MG tablet, Take 1 tablet by mouth every evening, Disp: 90 tablet, Rfl: 3    aspirin  81 MG EC tablet, Take 1 tablet by mouth daily morning, Disp: 90 tablet, Rfl: 3    acetaminophen  (TYLENOL ) 500 MG tablet, Take 2 tablets by mouth every 6 hours as needed for Pain, Disp: , Rfl:     Multiple Vitamin (MULTIVITAMIN ADULT PO), Take 1 tablet by mouth daily,  Disp: , Rfl:     Cholecalciferol 50 MCG (2000 UT) TABS, Take 1 tablet by mouth daily, Disp: , Rfl:     glucosamine-chondroitin 500-400 MG CAPS, Take 1 capsule by mouth daily, Disp: , Rfl:   Allergies   Allergen Reactions    Codeine Shortness Of Breath    Lisinopril Cough     Past Surgical History:   Procedure Laterality Date    CHOLECYSTECTOMY  1980    HIP SURGERY      HYSTERECTOMY, VAGINAL  2013    IR CHOLECYSTOSTOMY PERCUTANEOUS COMPLETE  1980    LAMINECTOMY  2023    LUMBAR DISCECTOMY  1998    LUMBAR SPINE SURGERY Right 12/31/2021    Re-exploration Right L5-S1 with lateral recess decompression & diskectomy - Dr. 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    HIP TOTAL ARTHROPLASTY ANTERIOR APPROACH performed by Terrilyn Pama GAILS, MD at [facility] MAIN OR     Family History   Problem Relation Age of Onset  Other Sister         prediabetic    Obesity Sister     Colon Cancer Sister 30        colostomy    Cancer Sister         colon    High Blood Pressure Brother     Mastectomy Maternal Grandmother 33        breast cancer    No Known Problems Maternal Grandfather     No Known Problems Paternal Grandmother     Diabetes Paternal Grandfather     Osteoarthritis Brother 60        2 knee replacement    High Blood Pressure Brother     No Known Problems Brother     Hypertension Mother     Diabetes Father     Uterine Cancer Neg Hx     Ovarian Cancer Neg Hx      Social History     Socioeconomic History    Marital status: Married     Spouse name: Not on file    Number of children: Not on file    Years of education: ADN    Highest education level: Not on file   Occupational History    Not on file   Tobacco Use    Smoking status: Former     Average packs/day: 0.2 packs/day for 43.1 years (8.6 ttl pk-yrs)     Types: Cigarettes     Start date: 09/02/1979    Smokeless tobacco: Never    Tobacco comments:     average 2 to 4 cigarettes daily    Vaping Use    Vaping status: Never Used   Substance and Sexual Activity    Alcohol use: Yes     Alcohol/week: 8.0 standard drinks of alcohol     Types: 8 Glasses of wine per week     Comment: I drink wine daily    Drug use: Never    Sexual activity: Yes     Partners: Male     Birth control/protection: Surgical     Comment: 53 years   Other Topics Concern    Not on file   Social History Narrative    y day.  Her husband does smoke cigars.  She does not use illicit drugs.  She does drink wine 1-2 glasses at night.    She enjoys her time with grand children, reading.    Wears a seat belt in a car.          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 Married to [employer].  4 children together:  - Sean 1970   -Adam 1972 Grand child- 1 boy  -Carrie 1975 Grand child -1 girl  -Almarie (Dayla) [phone] - 3 girls    She does currently smoke cigarettes - 1/2 a pack a week, not ever     Social Determinants of Health     Financial Resource Strain: Low Risk  (05/25/2023)    Overall Financial Resource Strain (CARDIA)     Difficulty of Paying Living Expenses: Not hard at all   Food Insecurity: No Food Insecurity (05/25/2023)    Hunger Vital Sign     Worried About Running Out of Food in the Last Year: Never true     Ran Out of Food in the Last Year: Never true   Transportation Needs: Unknown (05/25/2023)    PRAPARE - Therapist, Art (Medical): Not on file  Lack of Transportation (Non-Medical): No   Physical Activity: Sufficiently Active (05/21/2023)    Exercise Vital Sign     Days of Exercise per Week: 7 days     Minutes of Exercise per Session: 30 min   Stress: Not on file   Social Connections: Not on file   Intimate Partner Violence: Not on file   Housing Stability: Unknown (05/25/2023)    Housing Stability Vital Sign     Unable to Pay for Housing in the Last Year: Not on file     Number of Places Lived in the Last Year: Not on file     Unstable Housing in the Last Year: No        ROS: A 12-point review of systems was  reviewed with the patient on the date of this visit and is available in further detail in the chart.     Examination: Ht 1.562 m (5' 1.5)   Wt 69.4 kg (153 lb)   BMI 28.44 kg/m   The patient is alert and oriented x 3, comfortable at rest. Breathing is unlabored. The patient is cooperative with exam and answers all questions appropriately.   On examination of the right lower extremity, the limb is neurovascularly intact with brisk capillary refill present distally.. Skin is warm and dry.  No deformity seen.  There is a moderate effusion present. Range of motion of the right knee is from 0 - 120. There is 5/5 muscle strength present in EHL, FHL, AT, and GCS. There is no ligamentous laxity with varus/valgus stress in 0 degrees or in 30 degrees of flexion. Negative anterior and posterior draw.  Tenderness to palpation is present along the medial joint line.       Imaging: 3V xray taken in the office reviewed by myself demonstrates tricompartmental osteoarthritis.  Bone-on-bone arthritis of the medial and patellofemoral compartment. KL 4    Assessment:  Primary osteoarthritis right knee     Plan:   I reviewed imaging and clinical examination findings with the patient. I discussed the nature of this diagnosis and the various treatment options with the patient today.  We reviewed the use of NSAIDS, activity modification, physical therapy, muscle strengthening and weight management, cortisone and viscosupplementation injections,and surgical treatment options.   The patient chose to proceed with CSI today.    After informed consent was obtained and written consent signed, under sterile conditions, 80 mg of Depo-Medrol  and 4  mL of 1% Xylocaine  was injected into the patient's right knee intra-articularly. The patient tolerated the procedure quite well. Typical post-injection instructions were given.     Follow up will be 6 weeks.    Thank you for the referral of this pleasant patient.

## 2024-01-03 NOTE — Progress Notes (Signed)
 Procedure Time Out      Pre-procedure checklist:      [x]   Patient identity confirmed using (2) patient identifiers  [x]   Documenation in chart agrees with intended procedure  [x]   Consent is signed and agrees with chart and patient  [x]   Relevant images and test results are labeled and correctly displayed      Confirmation that patient has taken prescribed pre-procedure medications:    [x]   No premedications ordered    Timeout completed by the provider and assistant immediatily prior to the procedure:    [x]   Patient identity confirmed using (2) patient identifiers  [x]   Documentation in chart agrees with intended proceudre  [x]   Consent is signed and agrees with chart and patient   [x]   Correct site and side has been marked by provider  [x]   Relevant images and test results are labeled and correctly displayed  [x]   Any safety practices needed based on patient history     80 mg depo medrol lot #[id number] EXP 04/27/2025, NDC 0009-3475-01,MFR : [employer] ,  1% lidocaine lot# [id number] EXP 07/28/2026,NDC 56213-086-57, MFR: [facility] , right knee

## 2024-02-17 ENCOUNTER — Ambulatory Visit: Admit: 2024-02-17 | Discharge: 2024-02-17 | Payer: MEDICARE | Attending: Family | Primary: Family

## 2024-02-17 VITALS — Ht 61.5 in | Wt 153.0 lb

## 2024-02-17 DIAGNOSIS — M1711 Unilateral primary osteoarthritis, right knee: Secondary | ICD-10-CM

## 2024-02-17 NOTE — Progress Notes (Signed)
 CC: Follow up after Cortisone Injection    HPI: The patient is a 73 y.o.-year-old female who presents today for follow up after cortisone injection of the right knee.  The patient states that the injection provided meaningful pain relief. There was no adverse  reaction to the injection. No new injury. No recent illness, fevers, chills or night sweats.    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     Examination: Ht 1.562 m (5' 1.5")   Wt 69.4 kg (153 lb)   BMI 28.44 kg/m   The patient is awake, alert and oriented times 3 and answering questions appropriately. Affect is friendly and breathing is unlabored.     Examination of bilateral lower extremities reveals neurovascularly intact limbs. Skin is warm, dry and intact.There is 5/5 muscle strength in EHL/FHL. There is no ligament outs laxity. Negative anterior and posterior draw. No laxity to valgus or varus stress at both 0 and 30 degrees. Sensation is intact. No lower extremity edema.     Assessment: primary osteoarthritis right knee    Plan:  The injection has worked well for the patient. We may repeat the series every 3 months as needed for the knee pain. The patient will call us when the pain returns. Depending on the timing of the return of the pain we would consider repeat cortisone injection, viscosupplementation or surgery.     The patient understands the plan and will follow up accordingly.

## 2024-02-21 ENCOUNTER — Encounter

## 2024-02-21 MED ORDER — LEVOTHYROXINE SODIUM 25 MCG PO TABS
25 | ORAL_TABLET | Freq: Every day | ORAL | 1 refills | Status: AC
Start: 2024-02-21 — End: ?

## 2024-02-21 NOTE — Telephone Encounter (Signed)
 levothyroxine (SYNTHROID) 25 MCG tablet         Sig: Take 1 tablet by mouth daily    Disp: 90 tablet    Refills: 1    Start: 02/21/2024    Class: Normal    Non-formulary For: Subclinical hypothyroidism    Last ordered: 8 months ago (05/28/2023) by Ruthine Dose, APRN - NP    Thyroid Hormones Protocol Passed02/24/2025 12:46 PM   Protocol Details Last TSH normal within the past 12 months    Visit with authorizing provider in past 9 months or upcoming 90 days

## 2024-04-04 ENCOUNTER — Ambulatory Visit: Admit: 2024-04-04 | Discharge: 2024-04-04 | Payer: MEDICARE | Attending: Family | Primary: Family

## 2024-04-04 VITALS — Ht 61.5 in | Wt 153.0 lb

## 2024-04-04 DIAGNOSIS — M1711 Unilateral primary osteoarthritis, right knee: Secondary | ICD-10-CM

## 2024-04-04 MED ORDER — METHYLPREDNISOLONE ACETATE 80 MG/ML IJ SUSP
80 | Freq: Once | INTRAMUSCULAR | Status: AC
Start: 2024-04-04 — End: 2024-04-04
  Administered 2024-04-04: 19:00:00 80 mg via INTRA_ARTICULAR

## 2024-04-04 MED ORDER — LIDOCAINE HCL (PF) 1 % IJ SOLN
1 | Freq: Once | INTRAMUSCULAR | Status: AC
Start: 2024-04-04 — End: 2024-04-04
  Administered 2024-04-04: 19:00:00 4 mL via INTRA_ARTICULAR

## 2024-04-04 NOTE — Progress Notes (Signed)
 CC: Cortisone Injection right knee    HPI:  The patient is a 73 y.o. female who presents today for cortisone injection into the right knee. Pain today is reported at 6/10.    Examination:  Examination of bilateral lower extremities reveals neurovascularly intact limbs. Skin is cool, dry and intact. There is no knee effusion    Assessment: right knee primary osteoarthritis     Plan:Cortisone injection  After informed consent was obtained and written consent signed, under sterile conditions, 80 mg of Depo-Medrol and 4  mL of 1% Xylocaine was injected into the patient's right knee intra-articularly. The patient tolerated the procedure quite well. Typical post-injection instructions were given.

## 2024-04-04 NOTE — Progress Notes (Signed)
 Procedure Time Out      Pre-procedure checklist:      [x]   Patient identity confirmed using (2) patient identifiers  [x]   Documenation in chart agrees with intended procedure  [x]   Consent is signed and agrees with chart and patient  [x]   Relevant images and test results are labeled and correctly displayed      Confirmation that patient has taken prescribed pre-procedure medications:    [x]   No premedications ordered    Timeout completed by the provider and assistant immediatily prior to the procedure:    [x]   Patient identity confirmed using (2) patient identifiers  [x]   Documentation in chart agrees with intended proceudre  [x]   Consent is signed and agrees with chart and patient   []   Correct site and side has been marked by provider  [x]   Relevant images and test results are labeled and correctly displayed  [x]   Any safety practices needed based on patient history    Right knee  1% lidocaine lot#[id number], exp 06/28/2027, ndc [id number], MAN: [employer]   Depo- medrol [id number] exp 10/06/2025

## 2024-05-04 ENCOUNTER — Encounter

## 2024-05-10 ENCOUNTER — Inpatient Hospital Stay: Admit: 2024-05-10 | Payer: Medicare (Managed Care) | Attending: Family | Primary: Family

## 2024-05-10 DIAGNOSIS — Z1231 Encounter for screening mammogram for malignant neoplasm of breast: Secondary | ICD-10-CM

## 2024-05-24 ENCOUNTER — Inpatient Hospital Stay: Admit: 2024-06-08 | Payer: Medicare (Managed Care) | Primary: Family

## 2024-05-29 ENCOUNTER — Ambulatory Visit: Admit: 2024-06-06 | Discharge: 2024-06-06 | Payer: Medicare (Managed Care) | Attending: Family | Primary: Family

## 2024-05-29 VITALS — BP 136/84 | HR 65 | Ht 61.5 in | Wt 154.0 lb

## 2024-05-29 DIAGNOSIS — Z0000 Encounter for general adult medical examination without abnormal findings: Principal | ICD-10-CM

## 2024-06-13 LAB — VITAMIN D 25 HYDROXY: Vit D, 25-Hydroxy: 58.3 ng/mL

## 2024-06-13 MED ORDER — AMLODIPINE BESYLATE 10 MG PO TABS
10 | ORAL_TABLET | Freq: Every evening | ORAL | 3 refills | 90.00000 days | Status: AC
Start: 2024-06-13 — End: ?

## 2024-06-13 NOTE — Telephone Encounter (Signed)
 Calcium -Channel Blockers Protocol Passed05/29/2025 12:44 AM   Protocol Details Last Pulse reading greater than 50 recorded within past year    Visit with authorizing provider in past 9 months or upcoming 90 days

## 2024-06-14 LAB — COMPREHENSIVE METABOLIC PANEL
ALT: 37 U/L (ref 12–78)
AST: 30 U/L (ref 10–37)
Albumin: 4 g/dL (ref 3.4–5.0)
Alk Phosphatase: 104 U/L (ref 43–117)
BUN: 16 mg/dL (ref 7–22)
CO2: 27 mmol/L (ref 21–32)
Calcium: 9.2 mg/dL (ref 8.5–10.1)
Chloride: 106 mmol/L (ref 98–108)
Creatinine: 0.85 mg/dL (ref 0.55–1.10)
Est, Glom Filt Rate: 72 mL/min/{1.73_m2} (ref >60)
Glucose: 89 mg/dL (ref 74–106)
Potassium: 3.9 mmol/L (ref 3.4–5.1)
Sodium: 137 mmol/L (ref 136–145)
Total Bilirubin: 0.8 mg/dL (ref 0.00–1.00)
Total Protein: 8.2 g/dL (ref 6.4–8.2)

## 2024-06-14 LAB — CBC WITH AUTO DIFFERENTIAL
Basophils %: 0.5 % (ref 0–2)
Basophils Absolute: 0.05 10*3/uL (ref 0.0–0.1)
Eosinophils %: 3.2 % (ref 0–5)
Eosinophils Absolute: 0.3 10*3/uL (ref 0.0–0.4)
Hematocrit: 44.2 % (ref 37.0–47.0)
Hemoglobin: 15 g/dL (ref 12.0–16.0)
Immature Granulocytes %: 0.3 % (ref 0.0–0.6)
Immature Granulocytes Absolute: 0.03 10*3/uL (ref 0.00–0.04)
Lymphocytes Absolute: 1.79 10*3/uL (ref 1.2–3.7)
Lymphocytes: 19.3 % (ref 14–46)
MCH: 32.3 pg — ABNORMAL HIGH (ref 27.0–31.0)
MCHC: 33.9 g/dL (ref 33.0–37.0)
MCV: 95.3 FL — ABNORMAL HIGH (ref 80.0–94.0)
MPV: 10.7 FL — ABNORMAL HIGH (ref 7.4–10.4)
Monocytes %: 9.6 % (ref 5–12)
Monocytes Absolute: 0.89 10*3/uL (ref 0.2–1.0)
Neutrophils Absolute: 6.21 10*3/uL — ABNORMAL HIGH (ref 1.6–6.1)
Nucleated RBCs: 0 /100{WBCs}
Platelets: 343 10*3/uL (ref 130–400)
RBC: 4.64 M/uL (ref 4.20–5.40)
RDW: 13.3 % (ref 11.5–14.5)
Seg Neutrophils: 67.1 % (ref 47–80)
WBC: 9.3 10*3/uL (ref 4.5–10.9)
nRBC: 0 10*3/uL

## 2024-06-14 LAB — LIPID PANEL
Chol/HDL Ratio: 2.8
Cholesterol, Total: 205 mg/dL — ABNORMAL HIGH (ref <200)
HDL: 73 mg/dL (ref >50)
LDL Cholesterol: 75.8 mg/dL
Non-HDL Cholesterol: 132 mg/dL
Triglycerides: 281 mg/dL — ABNORMAL HIGH (ref <150)

## 2024-06-14 LAB — THYROID CASCADE PROFILE: TSH, 3rd Generation: 2.36 u[IU]/mL (ref 0.358–3.740)

## 2024-06-15 ENCOUNTER — Inpatient Hospital Stay
Admit: 2024-06-15 | Discharge: 2024-06-15 | Disposition: A | Discharge status: Home / Self Care | Payer: Medicare (Managed Care)

## 2024-06-15 DIAGNOSIS — J069 Acute upper respiratory infection, unspecified: Secondary | ICD-10-CM

## 2024-06-15 LAB — POC GROUP A STREP
LOT NUMBER POC: 8241482
RAPID STREP A POC STREP: NEGATIVE

## 2024-06-15 NOTE — ED Provider Notes (Signed)
 Chief Complaint   Patient presents with    Cough    Pharyngitis       73 year old patient, past medical history significant for HTN, HLD, and hypothyroidism, presents with complaints of sore throat that began on Monday with subsequent symptoms of nonproductive cough and postnasal drip that began on Tuesday.  Denies history of asthma or COPD.  Denies current smoking, patient reports she quit in 2023.  Denies dyspnea or difficulty breathing.  Denies chest pain, pressure, palpitations.  Denies difficulty swallowing.  Endorses bilateral ear pressure, denies pain or drainage.  Denies systemic symptoms of fevers, chills, nausea.  Patient has been using Delsym, Tylenol , and ibuprofen for symptom management.  Patient reports worsening cough at bedtime.        The history is provided by the patient.       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    Post-menopausal    Subclinical hypothyroidism    Primary osteoarthritis of left hip    Chronic pain of right knee    Chronic pruritus       Past Medical History:  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    HIP TOTAL ARTHROPLASTY ANTERIOR APPROACH performed by Terrilyn Pama GAILS, MD at [facility] MAIN OR       Social History     Socioeconomic History    Marital status: Married     Spouse name: Not on file    Number of children: Not on file    Years of education: ADN    Highest education level: Not on file   Occupational History    Not on file   Tobacco Use    Smoking status: Former     Average packs/day: 0.2 packs/day for 43.1 years (8.6 ttl pk-yrs)  Types: Cigarettes     Start date: 09/02/1979    Smokeless tobacco: Never    Tobacco comments:     average 2 to 4 cigarettes daily   Vaping Use    Vaping status: Never Used   Substance and Sexual Activity    Alcohol use: Yes     Alcohol/week: 8.0 standard drinks of alcohol     Types: 8 Glasses of wine per week     Comment: 1-3 a day    Drug use: Never    Sexual activity: Yes     Partners: Male     Birth control/protection: Surgical     Comment: 53 years   Other Topics Concern    Not on file   Social History Narrative    y day.  Her husband does smoke cigars.  She does not use illicit drugs.  She does drink wine 1-2 glasses at night.    She enjoys her time with grand children, reading.    Wears a seat belt in a car.          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 Married to [employer].  4 children together:  - Sean 1970   -Adam 1972 Grand child- 1 boy  -Carrie 1975 Grand child -1 girl  -Almarie (Dayla) [phone] - 3 girls    She does currently smoke cigarettes - 1/2 a pack a week, not ever     Social Drivers of Psychologist, prison and probation services Strain: Low Risk  (05/25/2023)    Overall  Financial Resource Strain (CARDIA)     Difficulty of Paying Living Expenses: Not hard at all   Food Insecurity: No Food Insecurity (05/29/2024)    Hunger Vital Sign     Worried About Running Out of Food in the Last Year: Never true     Ran Out of Food in the Last Year: Never true   Transportation Needs: No Transportation Needs (05/29/2024)    PRAPARE - Therapist, art (Medical): No     Lack of Transportation (Non-Medical): No   Physical Activity: Sufficiently Active (05/29/2024)    Exercise Vital Sign     Days of Exercise per Week: 5 days     Minutes of Exercise per Session: 30 min   Stress: Not on file   Social Connections: Not on file   Intimate Partner Violence: Not on file   Housing Stability: Low Risk  (05/29/2024)    Housing Stability Vital Sign     Unable to Pay for Housing in the Last Year: No     Number of Times Moved in the Last Year: 0     Homeless in the Last Year: No       Family History:  Family History   Problem Relation Age of Onset    Other Sister         prediabetic    Obesity Sister     Colon Cancer Sister 30        colostomy    Cancer Sister         colon    High Blood Pressure Brother     Mastectomy Maternal Grandmother 39        breast cancer    No Known Problems Maternal Grandfather     No Known Problems Paternal Grandmother     Diabetes Paternal Grandfather     Osteoarthritis Brother 60        2 knee replacement  High Blood Pressure Brother     No Known Problems Brother     Hypertension Mother     Diabetes Father     Uterine Cancer Neg Hx     Ovarian Cancer Neg Hx        Medications:  Patient medication list reviewed  This patient does not have an active medication from one of the medication groupers.    Allergies:    Codeine and Lisinopril    Review of Systems   Constitutional:  Negative for chills and fever.   HENT:  Positive for congestion and sore throat. Negative for ear discharge, ear pain, trouble swallowing and voice change.         Bilateral ear pressure    Respiratory:  Positive for cough. Negative for chest tightness and shortness of breath.    Cardiovascular:  Negative for chest pain and palpitations.   Gastrointestinal:  Negative for nausea.         Vitals:    06/15/24 1347   BP: 131/79   Pulse: 77   Resp: 16   Temp: 98.1 F (36.7 C)   TempSrc: Oral   SpO2: 97%     Physical Exam  Constitutional:       General: She is not in acute distress.     Appearance: Normal appearance. She is not ill-appearing or toxic-appearing.   HENT:      Head: Normocephalic.      Right Ear: Tympanic membrane, ear canal and external ear normal. No middle ear effusion.      Left Ear: Tympanic membrane, ear canal and external ear normal.  No middle ear effusion.      Nose: Congestion present.      Mouth/Throat:      Lips: Pink.      Mouth: Mucous membranes are moist.      Palate: No lesions.      Pharynx: Oropharynx is clear. Uvula midline. Posterior oropharyngeal erythema and postnasal drip present. No pharyngeal swelling, oropharyngeal exudate or uvula swelling.   Eyes:      Pupils: Pupils are equal, round, and reactive to light.   Cardiovascular:      Pulses: Normal pulses.      Heart sounds: Normal heart sounds.   Pulmonary:      Effort: Pulmonary effort is normal. No respiratory distress.      Breath sounds: Normal breath sounds.   Lymphadenopathy:      Cervical: No cervical adenopathy.   Neurological:      Mental Status: She is alert.         Medical Decision Making:  73 year old afebrile, non-ill appearing patient with symptoms consistent with a viral URI. Rapid strep negative, culture pending. I do not believe a chest x-ray is warranted at this time based on history and exam. There are no associated cardiac symptoms. I have low suspicion for PE. Exam is otherwise reassuring without signs of sinusitis or otitis media. Patient is sitting comfortably in the room in no acute respiratory distress. Vital signs are stable. Oxygen saturation is 97% on room air. Lung sounds are clear to  auscultation without adventitious sounds. Heart rate is regular. There are no systemic symptoms.  Differentials include but are not limited to pneumonia, pertussis, PE, bronchitis. Plan for symptom management with tylenol , ibuprofen, OTC cough medication, Flonase, daily antihistamine, and humidifier. Advised to return for re-evaluation if patient develops shortness of breath, difficulty breathing, or worsening symptoms. Red flag symptoms warranting emergent care also  discussed.     Advised on typical signs and symptoms warranting follow-up or emergency care, patient declined formal print out of after visit summary instructions for recommended symptomatic care, follow up, and plan of care. All patient's questions and concerns addressed and answered. Patient agrees with the plan of care.           Procedures    Vital Signs for this visit:  Patient Vitals for the past 12 hrs:   Temp Pulse Resp BP SpO2   06/15/24 1347 98.1 F (36.7 C) 77 16 131/79 97 %       Lab findings during this visit (only abnormal values will be noted, if no value noted then the result was normal range):  Labs Reviewed   STREP A CULTURE, THROAT   POC GROUP A STREP       Radiology studies during this visit and the past 24 hours:  No results found.    Medications given in the ED:  Medications - No data to display    Diagnosis:  Final diagnoses:   Acute upper respiratory infection       Discharge prescriptions and/or changes if applicable:       Medication List        ASK your doctor about these medications      acetaminophen  500 MG tablet  Commonly known as: TYLENOL      amLODIPine  10 MG tablet  Commonly known as: NORVASC   TAKE 1 TABLET BY MOUTH EVERY DAY IN THE EVENING     aspirin  81 MG EC tablet  Take 1 tablet by mouth daily morning     atorvastatin  40 MG tablet  Commonly known as: LIPITOR  TAKE 1 TABLET BY MOUTH EVERY DAY     Cholecalciferol 50 MCG (2000 UT) Tabs     Fish Oil 1200 MG Caps     glucosamine-chondroitin 500-400 MG Caps      levothyroxine  25 MCG tablet  Commonly known as: SYNTHROID   Take 1 tablet by mouth daily     metoprolol  succinate 25 MG extended release tablet  Commonly known as: TOPROL  XL  TAKE 1 TABLET BY MOUTH EVERY DAY FOR BLOOD PRESSURE     MULTIVITAMIN ADULT PO              Follow-up if applicable:  No follow-up provider specified.    Please note that portions of this document were created using the M*Modal Fluency Direct dictation system.  Any inconsistencies or typographical errors may be the result of mis-transcription that persist in spite of proof-reading and should be addressed with the document creator.        Laveda Rosina HERO, APRN - NP  06/15/24 1430

## 2024-06-15 NOTE — ED Triage Notes (Signed)
 Patient reports that she started with a cough and sore throat on Monday felt like she had fevers and reports she has not slept due to cough , has tried cough syrup delsym tylenol  and ibuprofen

## 2024-06-16 NOTE — Telephone Encounter (Signed)
 [facility] Care Management ED Visit Follow Up Call  06/16/2024      ED visit date 06/15/2024  Reason for ED visit: cough    How are you feeling since discharge? Still cough and sore throat.  States was prescribed flonase but has no hx allergies  Strep culture still pending as of this note  Pt staying hydrated w/water /tea.  We discussed pt may decide to self test for COVID. Husb tested for COVID in past couple wks and was negative.  Pt agrees she will call PCP office within couple days if not better or worse  Pt speaks full sentences on this call. Not in acute distress.    Have you had any fever or chills? (If your, complete additional sepsis screening) No  Was pt discharged with any antibiotics? No  Was pt counseled on completing antibiotic therapy? NA    RN reviewed discharge plan with patient Yes    RN and patient reviewed new medications Y    RN and patient discussed any care coordination needs N/A    Disease specific education provided. Y    Red flag symptoms reviewed Yes    Where to go for care discussed Y    Referrals to other care team members   no    Follow up with provider pt will call PCP office for appt as outlined above

## 2024-06-17 LAB — STREP A CULTURE, THROAT: Culture: NEGATIVE

## 2024-06-27 NOTE — Assessment & Plan Note (Signed)
 She reports occasional dysphagia, particularly with dry foods. She is advised to sandwich each bite with a fluid or use gravy or apple sauce to facilitate swallowing. Good chewing habits, tucking her chin while swallowing, and ensuring fluid intake after each bite are recommended. If symptoms persist or worsen, an endoscopy may be considered.

## 2024-06-27 NOTE — Assessment & Plan Note (Signed)
 She experiences occasional urinary leakage at night. Pelvic strengthening exercises are recommended. She is advised to limit fluid intake after dinner and ensure her bladder is empty before bedtime.

## 2024-06-27 NOTE — Assessment & Plan Note (Signed)
 She reports periodic itching and dry skin. She has been using a generic version of Aveeno with oatmeal, which has been helpful.

## 2024-06-27 NOTE — Assessment & Plan Note (Signed)
 Her overall health status is satisfactory. She maintains a balanced diet and engages in physical activity for 30 minutes daily, five days a week. She has regular dental check-ups and eye exams, with the most recent eye exam conducted in 12/2023. She reports no concerns related to driving or hearing. Her home is equipped with working smoke detectors and does not have any tripping hazards or loose carpets. She consistently wears a seatbelt while in the car. She is independent in her activities of daily living (ADLs), including eating, dressing, toileting, cooking, and managing finances. She does not require any assistive devices for personal care. She has a living will in place. She reports no skin concerns such as lumps or bumps. Her last mammogram was conducted in 06/2020. She tested positive for COVID-19 at some point but experienced only mild symptoms. She has received six doses of the COVID-19 vaccine, with the most recent dose administered in 11/2023. A prescription refill for amlodipine  has been provided. She is advised to receive the RSV vaccine in the fall, coinciding with her influenza vaccine. An annual COVID-19 vaccine is also recommended.

## 2024-06-27 NOTE — Progress Notes (Signed)
 MEDICARE ANNUAL WELLNESS VISIT       CHIEF COMPLAINT   Barbara Elliott is a 73 y.o. female who presents today for Medicare AWV     HISTORY OF PRESENT ILLNESS     History of Present Illness  The patient is a 73 year old female who presents for an annual wellness visit.    She reports no current health concerns. She has been monitoring her blood pressure at home intermittently and reports no leg swelling, palpitations, chest pain, shortness of breath, or blurred vision. She maintains a balanced diet and engages in physical activity for 30 minutes daily, five days a week. She has regular dental check-ups and eye exams, with the most recent eye exam conducted in 12/2023. She reports no concerns related to driving or hearing. Her home is equipped with working smoke detectors and does not have any tripping hazards or loose carpets. She consistently wears a seatbelt while in the car. She is independent in her activities of daily living (ADLs), including eating, dressing, toileting, cooking, and managing finances. She does not require any assistive devices for personal care. She has a living will in place. She reports no skin concerns such as lumps or bumps. Her last mammogram was conducted in 06/2020. She reports no bowel symptoms, blood in stool, or black tarry stools. She tested positive for COVID-19 at some point but experienced only mild symptoms. She has received six doses of the COVID-19 vaccine, with the most recent dose administered in 11/2023.    She experiences intermittent itching and dry skin, which she manages with Aveeno lotion, finding it beneficial.    She occasionally experiences a sensation of ear fullness but does not use hydrogen peroxide, warm water , or Debrox for relief.    She has been experiencing dysphagia for the past 1 to 2 years, which she attributes to aging. She has not undergone esophageal dilation.    She reports nocturia and occasional urinary incontinence en route to the bathroom, for  which she uses a pad. She typically consumes her last beverage around 10 PM and goes to bed around 11 PM. She ensures to urinate before going to bed and first thing in the morning around 5 AM. She takes amlodipine  at night and metoprolol  in the morning.    She has been experiencing knee pain, which was initially managed with an injection that provided relief for three months. However, the second injection administered by orthopedics lasted less than a month. The pain is not constant, but she reports instability in the knee, necessitating the use of a cane for mobility over long distances such as parking lots. She has not had any recent falls and does not have a scheduled appointment with orthopedics, opting for an as-needed approach. She expresses a desire to avoid surgical intervention. She has not undergone physical therapy for her knee but adheres to a regimen of exercises prescribed following her hip replacement surgery, which she performs at least five days a week.    PAST SURGICAL HISTORY:  Hip replacement    SOCIAL HISTORY  The patient consumes alcohol approximately four times per week, with 3-4 drinks per week.    FAMILY HISTORY  The patient's sister had colon cancer and underwent a colostomy in her 30s.    Reviewed on today's visit patient's past medical/surgical history, family/social history, medications allergies, previous lab work and imaging studies immunizations and preventive care    PHYSICAL EXAM     Physical Exam       General:  Pleasant, well-dressed and groomed, good historian  Eyes: PERRLA, non-injected  Ears: tympanic membranes are non-erythematous and in the neutral position, EAM without erythema or discharge  Nose: patent  Oropharynx: oral mucosa moist without lesions, no pharyngeal injection, tonsillar hypertrophy or exudate  Neck: no adenopathy, thyromegaly, masses, or bruits  Heart: regular rate and rhythm without murmur  Lungs: clear to auscultation  Abdomen: soft, nontender, no masses or  organomegaly  Extremities: no edema, pulses are normal  Musculoskeletal:  No joint swelling or tenderness. Normal gait.  Neurologic:  CNII-XII intact. Normal strength, sensation and reflexes throughout.  Skin:  No rash or suspicious skin lesions  VITALS     Vitals:    05/29/24 1300   BP: 136/84   BP Site: Right Upper Arm   Patient Position: Sitting   Pulse: 65   SpO2: 96%   Weight: 69.9 kg (154 lb)   Height: 1.562 m (5' 1.5)    - Body mass index is 28.63 kg/m.  RESULTS     Results         MEDICATIONS     Current Outpatient Medications   Medication Sig    amLODIPine  (NORVASC ) 10 MG tablet TAKE 1 TABLET BY MOUTH EVERY DAY IN THE EVENING    levothyroxine  (SYNTHROID ) 25 MCG tablet Take 1 tablet by mouth daily    metoprolol  succinate (TOPROL  XL) 25 MG extended release tablet TAKE 1 TABLET BY MOUTH EVERY DAY FOR BLOOD PRESSURE    atorvastatin  (LIPITOR) 40 MG tablet TAKE 1 TABLET BY MOUTH EVERY DAY    Omega-3 Fatty Acids (FISH OIL) 1200 MG CAPS Take by mouth (Patient not taking: Reported on 06/15/2024)    aspirin  81 MG EC tablet Take 1 tablet by mouth daily morning    acetaminophen  (TYLENOL ) 500 MG tablet Take 2 tablets by mouth every 6 hours as needed for Pain    Multiple Vitamin (MULTIVITAMIN ADULT PO) Take 1 tablet by mouth daily    Cholecalciferol 50 MCG (2000 UT) TABS Take 1 tablet by mouth daily    glucosamine-chondroitin 500-400 MG CAPS Take 1 capsule by mouth daily     No current facility-administered medications for this visit.     There are no discontinued medications.    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  Her husband does smoke cigars.  She does not use illicit drugs.  She does drink wine 1-2 glasses at night.    She enjoys her time with grand children, reading.    Wears a seat belt in a car.          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 Married to [employer].  4 children together:  - Sean 1970   -Adam 1972 Grand child- 1 boy  -Carrie 1975 Grand child -1 girl  -Almarie (Dayla) [phone] - 3 girls    She does currently smoke cigarettes - 1/2 a pack a week, not ever     FAMILY HISTORY     Family History   Problem Relation Age of Onset    Other Sister         prediabetic    Obesity Sister     Colon Cancer Sister 30        colostomy    Cancer Sister         colon    High Blood Pressure Brother     Mastectomy Maternal Grandmother 96        breast cancer    No Known Problems Maternal Grandfather     No Known Problems Paternal Grandmother     Diabetes Paternal Grandfather     Osteoarthritis Brother 60        2 knee replacement    High Blood Pressure Brother     No Known Problems  Brother     Hypertension Mother     Diabetes Father     Uterine Cancer Neg Hx     Ovarian Cancer Neg Hx        SCREENING  QUESTIONNAIRES     STOP-BANG (3+ high risk)        No data to display                 DEPRESSION SCREENING  Severity:  1-4 low risk; 5-9 mild depressive symptoms; 10-14 mild major depression; 15-19 moderate major depression; 20-27 severe major depression      05/29/2024     1:00 PM   PHQ-9    Little interest or pleasure in doing things 0   Feeling down, depressed, or hopeless 1   PHQ-2 Score 1   PHQ-9 Total Score 1        MEDICARE ANNUAL WELLNESS VISIT HEALTH RISK ASSESSMENT & PLAN     Recommendations for Preventive Services Due: see orders and patient instructions/AVS.   Recommended screening schedule for the next 5-10 years is provided to the patient in written form: see Patient Instructions/AVS.   Patient's complete Health Risk Assessment and screening values have been reviewed and are found in Flowsheets. The following problems were reviewed today and where indicated follow up appointments were made and/or referrals ordered.     Positive Risk Factor Screenings with Interventions:  Fall Risk:  Do you feel unsteady or are you worried about falling? : (!) yes  2 or more falls in past year?: no  Fall with injury in past year?: no     Interventions:    Reviewed medications, home hazards, visual acuity, and co-morbidities that can increase risk for falls       Alcohol Screening:  AUDIT-C Score: 5  AUDIT Total Score: 5      Interventions:  Patient declined any further intervention or treatment    Alcohol Misuse Counseling: Patient was asked about her current pattern of alcohol consumption, and results of screening for alcohol misuse, performed within the past 12 months, have been consistent with harmful or hazardous drinking, but do not meet criteria for alcohol dependence. Patient's individual health risks associated with alcohol misuse were discussed, as well as the likely benefits of abstinence  or cutting down. Reviewed recommended alcohol consumption guidelines with patient. Based upon patient's motivation to change her behavior, the following plan was agreed upon: denies any need for intervention. Educational materials to reinforce counseling were provided. Patient will follow-up in not at this time . Provider spent <3 minutes counseling patient.             Sexual Health: She  reports being sexually active and has had partner(s) who are female. She reports using the following method of birth control/protection: Surgical.    CARE TEAM   Patient Care Team:  Roxanne Greig Norris, APRN - NP as PCP - General    ASSESSMENT AND PLAN     Assessment & Plan    1. Encounter for subsequent annual wellness visit (AWV) in Medicare patient  2. Preventative health care  Assessment & Plan:  Her overall health status is satisfactory. She maintains a balanced diet and engages in physical activity for 30 minutes daily, five days a week. She has regular dental check-ups and eye exams, with the most recent eye exam conducted in 12/2023. She reports no concerns related to driving or hearing. Her home is equipped with working smoke detectors and does not have any tripping hazards or loose carpets. She consistently wears a seatbelt while in the car. She is independent in her activities of daily living (ADLs), including eating, dressing, toileting, cooking, and managing finances. She does not require any assistive devices for personal care. She has a living will in place. She reports no skin concerns such as lumps or bumps. Her last mammogram was conducted in 06/2020. She tested positive for COVID-19 at some point but experienced only mild symptoms. She has received six doses of the COVID-19 vaccine, with the most recent dose administered in 11/2023. A prescription refill for amlodipine  has been provided. She is advised to receive the RSV vaccine in the fall, coinciding with her influenza vaccine. An annual COVID-19 vaccine is  also recommended.   3. Chronic pain of right knee  Assessment & Plan:  She reports ongoing knee pain and instability despite previous injections. Further imaging, such as an MRI, may be necessary to rule out torn ligaments contributing to the instability. Physical therapy exercises targeting the quadriceps and hamstrings will be provided via MyChart to perform at home. If she experiences increased pain or instability, she should contact orthopedics for further evaluation.   4. Bilateral impacted cerumen  Assessment & Plan:   She has cerumen impaction in her right ear. She prefers to manage this at home using hydrogen peroxide or warm water .  5. Dysphagia, unspecified type  Assessment &  Plan:  She reports occasional dysphagia, particularly with dry foods. She is advised to sandwich each bite with a fluid or use gravy or apple sauce to facilitate swallowing. Good chewing habits, tucking her chin while swallowing, and ensuring fluid intake after each bite are recommended. If symptoms persist or worsen, an endoscopy may be considered.   6. Functional urinary incontinence  Assessment & Plan:  She experiences occasional urinary leakage at night. Pelvic strengthening exercises are recommended. She is advised to limit fluid intake after dinner and ensure her bladder is empty before bedtime.   7. Chronic pruritus  Assessment & Plan:  She reports periodic itching and dry skin. She has been using a generic version of Aveeno with oatmeal, which has been helpful.        Follow up:  Return in about 1 year (around 05/29/2025) for awv.     No future appointments.    The patient (or guardian, if applicable) and other individuals in attendance with the patient were advised that Artificial Intelligence will be utilized during this visit to record, process the conversation to generate a clinical note, and support improvement of the AI technology. The patient (or guardian, if applicable) and other individuals in attendance at the  appointment consented to the use of AI, including the recording.

## 2024-06-27 NOTE — Assessment & Plan Note (Signed)
 She reports ongoing knee pain and instability despite previous injections. Further imaging, such as an MRI, may be necessary to rule out torn ligaments contributing to the instability. Physical therapy exercises targeting the quadriceps and hamstrings will be provided via MyChart to perform at home. If she experiences increased pain or instability, she should contact orthopedics for further evaluation.

## 2024-06-27 NOTE — Assessment & Plan Note (Addendum)
 She has cerumen impaction in her right ear. She prefers to manage this at home using hydrogen peroxide or warm water .

## 2024-06-30 ENCOUNTER — Inpatient Hospital Stay
Admit: 2024-06-30 | Discharge: 2024-06-30 | Disposition: A | Discharge status: Home / Self Care | Payer: Medicare (Managed Care) | Arrived: VH | Attending: Family

## 2024-06-30 ENCOUNTER — Emergency Department: Admit: 2024-06-30 | Payer: Medicare (Managed Care) | Primary: Family

## 2024-06-30 DIAGNOSIS — J069 Acute upper respiratory infection, unspecified: Principal | ICD-10-CM

## 2024-06-30 MED ORDER — AMOXICILLIN-POT CLAVULANATE 875-125 MG PO TABS
875-125 | ORAL_TABLET | Freq: Two times a day (BID) | ORAL | 0 refills | Status: AC
Start: 2024-06-30 — End: 2024-07-07

## 2024-06-30 MED ORDER — AZITHROMYCIN 250 MG PO TABS
250 | ORAL_TABLET | ORAL | 0 refills | 5.00000 days | Status: DC
Start: 2024-06-30 — End: 2024-12-04

## 2024-06-30 MED ORDER — PREDNISONE 20 MG PO TABS
20 | ORAL_TABLET | Freq: Every day | ORAL | 0 refills | Status: AC
Start: 2024-06-30 — End: 2024-07-05

## 2024-06-30 MED ORDER — ALBUTEROL SULFATE HFA 108 (90 BASE) MCG/ACT IN AERS
108 | Freq: Once | RESPIRATORY_TRACT | Status: AC
Start: 2024-06-30 — End: 2024-06-30
  Administered 2024-06-30: 18:00:00 2 via RESPIRATORY_TRACT

## 2024-06-30 MED FILL — VENTOLIN HFA 108 (90 BASE) MCG/ACT IN AERS: 108 (90 Base) MCG/ACT | RESPIRATORY_TRACT | Qty: 8

## 2024-06-30 NOTE — ED Provider Notes (Signed)
 Chief Complaint   Patient presents with    Cough     HPI    73 year old female with a past medical history notable for hypertension, hyperlipidemia and former tobacco smoker presents to [facility] for evaluation of chest congestion that has been ongoing for the past 2-3 weeks.  Reports over the past 3-4 days the cough has become productive and she feels that she may have an infection in her lungs.  She has been taking Mucinex.  Has not taking any other medications.  States that it started as a "cold" but has worsened.  She denies any fevers or chills, chest pain or pressure, shortness of breath, hemoptysis, orthopnea.    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    Post-menopausal    Subclinical hypothyroidism    Primary osteoarthritis of left hip    Chronic pain of right knee    Chronic pruritus    Bilateral impacted cerumen    Dysphagia    Functional urinary incontinence     Past Medical History:  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    surgery Dec 31 2021    Wears glasses      Past Surgical History:   Procedure Laterality Date    CHOLECYSTECTOMY  1980    HIP SURGERY      HYSTERECTOMY, VAGINAL  2013    IR CHOLECYSTOSTOMY PERCUTANEOUS COMPLETE   1980    LAMINECTOMY  2023    LUMBAR DISCECTOMY  1998    LUMBAR SPINE SURGERY Right 12/31/2021    Re-exploration Right L5-S1 with lateral recess decompression & diskectomy - Dr. 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    HIP TOTAL ARTHROPLASTY ANTERIOR APPROACH performed by Terrilyn Pama GAILS, MD at [facility] MAIN OR     Social History     Socioeconomic History    Marital status: Married     Spouse name: Not on file    Number of children: Not on file    Years of education: ADN    Highest education level: Not on file   Occupational History    Not on file   Tobacco Use    Smoking status: Former     Average packs/day: 0.2 packs/day for 43.1 years (8.6 ttl pk-yrs)     Types: Cigarettes     Start date: 09/02/1979  Smokeless tobacco: Never    Tobacco comments:     average 2 to 4 cigarettes daily   Vaping Use    Vaping status: Never Used   Substance and Sexual Activity    Alcohol use: Yes     Alcohol/week: 8.0 standard drinks of alcohol     Types: 8 Glasses of wine per week     Comment: 1-3 a day    Drug use: Never    Sexual activity: Yes     Partners: Male     Birth control/protection: Surgical     Comment: 53 years   Other Topics Concern    Not on file   Social History Narrative    y day.  Her husband does smoke cigars.  She does not use illicit drugs.  She does drink wine 1-2 glasses at night.    She enjoys her time with grand children, reading.    Wears a seat belt in a car.          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 Married to [employer].  4 children together:  - Sean 1970   -Adam 1972 Grand child- 1 boy  -Carrie 1975 Grand child -1 girl  -Almarie (Dayla) [phone] - 3 girls    She does currently smoke cigarettes - 1/2 a pack a week, not ever     Social Drivers of Psychologist, prison and probation services Strain: Low Risk  (05/25/2023)    Overall Financial Resource Strain (CARDIA)     Difficulty of Paying Living Expenses: Not hard at all   Food  Insecurity: No Food Insecurity (05/29/2024)    Hunger Vital Sign     Worried About Running Out of Food in the Last Year: Never true     Ran Out of Food in the Last Year: Never true   Transportation Needs: No Transportation Needs (05/29/2024)    PRAPARE - Therapist, art (Medical): No     Lack of Transportation (Non-Medical): No   Physical Activity: Sufficiently Active (05/29/2024)    Exercise Vital Sign     Days of Exercise per Week: 5 days     Minutes of Exercise per Session: 30 min   Stress: Not on file   Social Connections: Not on file   Intimate Partner Violence: Not on file   Housing Stability: Low Risk  (05/29/2024)    Housing Stability Vital Sign     Unable to Pay for Housing in the Last Year: No     Number of Times Moved in the Last Year: 0     Homeless in the Last Year: No     Family History:  Family History   Problem Relation Age of Onset    Other Sister         prediabetic    Obesity Sister     Colon Cancer Sister 30        colostomy    Cancer Sister         colon    High Blood Pressure Brother     Mastectomy Maternal Grandmother 66        breast cancer    No Known Problems Maternal Grandfather     No Known Problems Paternal Grandmother     Diabetes Paternal Grandfather     Osteoarthritis Brother 60        2 knee replacement    High Blood Pressure Brother     No Known Problems Brother  Hypertension Mother     Diabetes Father     Uterine Cancer Neg Hx     Ovarian Cancer Neg Hx      Medications:  Patient medication list reviewed  This patient does not have an active medication from one of the medication groupers.    Allergies:    Codeine and Lisinopril    Review of Systems    See HPI    Vitals:    06/30/24 1315   BP: (!) 149/79   Pulse: 73   Resp: 16   Temp: 97.5 F (36.4 C)   TempSrc: Oral   SpO2: 96%     Physical Exam    GENERAL: well-developed, well-nourished, in no distress  HEAD: atraumatic, normocephalic, non-tender frontal and maxillary sinuses on percussion  EYES: PERRLA, no  eye discharge or injection  EARS: bilateral tympanic membranes pearly gray, non-bulging, normal light reflex  NOSE: nasal turbinates swollen, moist with redness and clear discharge  OROPHARYNX: buccal mucosa moist, oropharynx clear; positive posterior pharyngeal cobblestoning; no tonsillar swelling, injection or exudates  NECK: supple, no adenopathy, full ROM, non-tender, no stridor  LUNGS: Rhonchi that clear with coughing and scattered expiratory wheezes without rales; no percussion tenderness or dullness; no accessory retraction or nasal flaring  HEART: regular rate and rhythm  ABDOMEN: Soft, nontender, no hepatosplenomegaly        Medical Decision Making:    Patient is alert, oriented and in no acute distress.  Vital signs are stable. She is afebrile without antipyretics on board, no fevers at home. Not tachypneic, tachycardic or hypoxic.  Nontoxic appearing.  Appears well-hydrated.  Patient is without signs of bacterial sinusitis, otitis, mastoiditis, pharyngitis.  No meningeal signs.  Abdomen is soft, nontender nonsurgical.  There is no joint swelling, or pain.  No rashes.      Lung sounds with rhonchi that clear with coughing, scattered expiratory wheezes without rales.  Improved aeration after albuterol  MDI and spacer, dispensed to patient.  Chest x-ray without acute findings.  Do suspect bacterial etiology, will start the patient on Z-Pak and Augmentin .  Prednisone  burst.    Discussed medications over the counter for supportive care measures. Discussed isolation/quarantine needs. Thoroughly discussed reasons to present back for re-evaluation. Encouraged to follow up with primary care provider. Go to ER for any new or worsening symptoms. Patient verbalizes understanding of discharge instructions including reasons to return for re-evaluation and discharged home at this time.    ED Course:     Procedures    Vital Signs for this visit:  Patient Vitals for the past 12 hrs:   Temp Pulse Resp BP SpO2   06/30/24  1315 97.5 F (36.4 C) 73 16 (!) 149/79 96 %     Lab findings during this visit (only abnormal values will be noted, if no value noted then the result was normal range):  Labs Reviewed - No data to display    Radiology studies during this visit and the past 24 hours:  No results found.    Medications given in the ED:  Medications   albuterol  sulfate HFA (PROVENTIL ;VENTOLIN ;PROAIR ) 108 (90 Base) MCG/ACT inhaler 2 puff (2 puffs Inhalation Given 06/30/24 1344)     Diagnosis:  Final diagnoses:   Acute upper respiratory infection     Discharge prescriptions and/or changes if applicable:       Medication List        START taking these medications      amoxicillin -clavulanate 875-125 MG per tablet  Commonly  known as: AUGMENTIN   Take 1 tablet by mouth 2 times daily for 7 days     azithromycin  250 MG tablet  Commonly known as: Zithromax  Z-Pak  500 mg today then 250 mg days 2-5     predniSONE  20 MG tablet  Commonly known as: DELTASONE   Take 2 tablets by mouth daily for 5 days            ASK your doctor about these medications      acetaminophen  500 MG tablet  Commonly known as: TYLENOL      amLODIPine  10 MG tablet  Commonly known as: NORVASC   TAKE 1 TABLET BY MOUTH EVERY DAY IN THE EVENING     aspirin  81 MG EC tablet  Take 1 tablet by mouth daily morning     atorvastatin  40 MG tablet  Commonly known as: LIPITOR  TAKE 1 TABLET BY MOUTH EVERY DAY     Cholecalciferol 50 MCG (2000 UT) Tabs     Fish Oil 1200 MG Caps     glucosamine-chondroitin 500-400 MG Caps     levothyroxine  25 MCG tablet  Commonly known as: SYNTHROID   Take 1 tablet by mouth daily     metoprolol  succinate 25 MG extended release tablet  Commonly known as: TOPROL  XL  TAKE 1 TABLET BY MOUTH EVERY DAY FOR BLOOD PRESSURE     MULTIVITAMIN ADULT PO               Where to Get Your Medications        These medications were sent to [id number] [place], [place] - [address] - P [phone] - F [phone]  [address], [place] [address]      Phone:  [phone]   amoxicillin -clavulanate 875-125 MG per tablet  azithromycin  250 MG tablet  predniSONE  20 MG tablet       Follow-up if applicable:  No follow-up provider specified.    Please note that portions of this document were created using the M*Modal Fluency Direct dictation system.  Any inconsistencies or typographical errors may be the result of mis-transcription that persist in spite of proof-reading and should be addressed with the document creator.        Barbara Nat Jansky, APRN - NP  06/30/24 [phone]

## 2024-06-30 NOTE — Telephone Encounter (Signed)
 Pt seen at [facility] on 06/30/24

## 2024-06-30 NOTE — ED Triage Notes (Signed)
 Patient reports she has had a cough for a few weeks and states that it started around June 26th has chest congestion denies SOB has been using musinex

## 2024-06-30 NOTE — Discharge Instructions (Signed)
 Z-pack per package instructions  Augmentin  twice daily X 7 days  Prednisone  daily in the morning with food for 5 days  Albuterol  inhaler: 2 puffs every 4-6 hours as needed for cough, shortness of breath or wheezing  Take Mucinex over-the-counter as directed congestion  Use nasal saline spray as directed for decongestion  Place humidifier at bedside  Take Tylenol /ibuprofen as directed for any fevers or discomfort   Perform saltwater gargles 3 times daily for any hoarseness or sore throat.  Follow up with your primary care provider if your symptoms worsen or do not improve

## 2024-07-03 NOTE — Telephone Encounter (Signed)
 [facility] ED Visit Follow Up Call  07/03/2024      ED visit date 06/30/2024  Reason for ED visit: cough, 2 new ABX ordered    How are you feeling since discharge? Better    Have you had any fever or chills? (If your, complete additional sepsis screening) No  Was pt discharged with any antibiotics? Yes  Was pt counseled on completing antibiotic therapy? Yes    RN reviewed discharge plan with patient Yes    RN and patient reviewed new medications Y    RN and patient discussed any care coordination needs N/A    Disease specific education provided. Y    Red flag symptoms reviewed Yes    Where to go for care discussed Y    Referrals to other care team members   no    Follow up with provider pt will call if symptoms worsen or fail to improve in next few days

## 2024-07-26 ENCOUNTER — Ambulatory Visit: Admit: 2024-07-26 | Discharge: 2024-07-26 | Payer: Medicare (Managed Care) | Attending: Family | Primary: Family

## 2024-07-26 VITALS — Ht 61.5 in | Wt 154.0 lb

## 2024-07-26 DIAGNOSIS — M1711 Unilateral primary osteoarthritis, right knee: Principal | ICD-10-CM

## 2024-07-26 MED ORDER — LIDOCAINE HCL 1 % IJ SOLN
1 | Freq: Once | INTRAMUSCULAR | Status: AC
Start: 2024-07-26 — End: 2024-07-26
  Administered 2024-07-26: 15:00:00 4 mL via INTRA_ARTICULAR

## 2024-07-26 MED ORDER — METHYLPREDNISOLONE ACETATE 80 MG/ML IJ SUSP
80 | Freq: Once | INTRAMUSCULAR | Status: AC
Start: 2024-07-26 — End: 2024-07-26
  Administered 2024-07-26: 14:00:00 80 mg via INTRA_ARTICULAR

## 2024-07-26 NOTE — Progress Notes (Signed)
 CC: Cortisone Injection rigth knee    HPI:  The patient is a 73 y.o. female who presents today for cortisone injection into the right knee. Pain today is reported at 5/10.  Patient reports a decreased response from her last cortisone injection.  However she attributes it to travel.  She would like to try another cortisone steroid injection into the right knee for treatment of her arthritic pain.    Examination:  Examination of bilateral lower extremities reveals neurovascularly intact limbs. Skin is cool, dry and intact. There is no knee effusion    Assessment:  Right knee primary osteoarthritis     Plan:Cortisone injection  After informed consent was obtained and written consent signed, under sterile conditions, 80 mg of Depo-Medrol  and 4  mL of 1% Xylocaine  was injected into the patient's right knee intra-articularly. The patient tolerated the procedure quite well. Typical post-injection instructions were given.     I discussed with the patient the option of viscosupplementation as an alternative for treating her arthritic pain.  The patient will call the office if she does not receive meaningful pain relief from the cortisone injection and we will submit to the insurance for approval for viscosupplementation.  Patient is agreeable to this plan.

## 2024-07-26 NOTE — Addendum Note (Signed)
 Addended by: WINNIFRED ROSELLA on: 07/26/2024 10:38 AM     Modules accepted: Orders

## 2024-07-26 NOTE — Addendum Note (Signed)
 Addended by: SHARLEE JEOFFREY CUTTER on: 07/26/2024 10:36 AM     Modules accepted: Orders

## 2024-07-26 NOTE — Progress Notes (Signed)
 Procedure Time Out      Pre-procedure checklist:      [x]   Patient identity confirmed using (2) patient identifiers  [x]   Documenation in chart agrees with intended procedure  [x]   Consent is signed and agrees with chart and patient  [x]   Relevant images and test results are labeled and correctly displayed      Confirmation that patient has taken prescribed pre-procedure medications:    [x]   No premedications ordered    Timeout completed by the provider and assistant immediatily prior to the procedure:    [x]   Patient identity confirmed using (2) patient identifiers  [x]   Documentation in chart agrees with intended proceudre  [x]   Consent is signed and agrees with chart and patient   [x]   Correct site and side has been marked by provider  [x]   Relevant images and test results are labeled and correctly displayed  [x]   Any safety practices needed based on patient history      80 mg depo medrol  lot #[id number], EXP 10/28/2025, NDC 0009-3475-01,MFR : [employer], RT knee,  1% lidocaine  onu#[id number], exp 10/29/2027, ndc 36676-514-72, MAN: [facility], RT knee

## 2024-08-02 ENCOUNTER — Encounter

## 2024-08-10 MED ORDER — AMLODIPINE BESYLATE 10 MG PO TABS
10 | ORAL_TABLET | Freq: Every evening | ORAL | 3 refills | 30.00000 days | Status: AC
Start: 2024-08-10 — End: ?

## 2024-08-10 NOTE — Telephone Encounter (Signed)
 Patient sending request via mychart.

## 2024-08-10 NOTE — Telephone Encounter (Signed)
 Calcium -Channel Blockers Protocol Passed08/14/2025 07:28 AM   Protocol Details Last Pulse reading greater than 50 recorded within past year    Visit with authorizing provider in past 9 months or upcoming 90 days

## 2024-08-18 MED ORDER — METOPROLOL SUCCINATE ER 25 MG PO TB24
25 | ORAL_TABLET | Freq: Every day | ORAL | 1 refills | 30.00000 days | Status: AC
Start: 2024-08-18 — End: ?

## 2024-08-18 MED ORDER — LEVOTHYROXINE SODIUM 25 MCG PO TABS
25 | ORAL_TABLET | Freq: Every day | ORAL | 1 refills | 90.00000 days | Status: AC
Start: 2024-08-18 — End: ?

## 2024-08-18 NOTE — Telephone Encounter (Signed)
 Name from pharmacy: METOPROLOL  SUCC ER 25 MG TAB         Will file in chart as: metoprolol  succinate (TOPROL  XL) 25 MG extended release tablet    Sig: TAKE 1 TABLET BY MOUTH EVERY DAY FOR BLOOD PRESSURE    Disp: 90 tablet    Refills: 3    Start: 08/18/2024    Class: Normal    Non-formulary    Last ordered: 10 months ago (10/04/2023) by Greig Almarie Cocking, APRN - NP    Last refill: 05/18/2024    Rx #: [id number]    Beta-Blockers Protocol Passed08/22/2025 12:47 AM   Protocol Details Last Pulse reading greater than 50 recorded within past year    Visit with authorizing provider in past 9 months or upcoming 90 days       Name from pharmacy: LEVOTHYROXINE  25 MCG TABLET         Will file in chart as: levothyroxine  (SYNTHROID ) 25 MCG tablet    Sig: TAKE 1 TABLET BY MOUTH EVERY DAY    Disp: 90 tablet    Refills: 1    Start: 08/18/2024    Class: Normal    For: Subclinical hypothyroidism    Last ordered: 5 months ago (02/21/2024) by Greig Almarie Cocking, APRN - NP    Last refill: 05/18/2024    Rx #: [id number]    Thyroid Hormones Protocol Passed08/22/2025 12:47 AM   Protocol Details Last TSH normal within the past 12 months    Visit with authorizing provider in past 9 months or upcoming 90 days      Rx meets protocol.

## 2024-09-06 ENCOUNTER — Emergency Department: Admit: 2024-09-06 | Payer: Medicare (Managed Care) | Primary: Family

## 2024-09-06 ENCOUNTER — Inpatient Hospital Stay
Admit: 2024-09-06 | Discharge: 2024-09-06 | Disposition: A | Discharge status: Home / Self Care | Payer: Medicare (Managed Care) | Arrived: VH

## 2024-09-06 DIAGNOSIS — S2242XA Multiple fractures of ribs, left side, initial encounter for closed fracture: Principal | ICD-10-CM

## 2024-09-06 NOTE — ED Provider Notes (Signed)
 Chief Complaint   Patient presents with    Side Pain       HPI  this is a 73 year old female who presents to [facility] urgent care for evaluation of left-sided rib pain x2 days.  Patient states that on Monday, she misstepped landing into the wall.  She notes that she has had pain on that side along with bruising that has developed.  She has been taking Tylenol  and/or ibuprofen, alternating.  She has been applying ice periodically.  She denies abdominal pain, chest pain, nausea, vomiting, shortness of breath.  She denies chest pain, dizziness, gait disturbances prior to fall at time of fall or currently.  Denies hitting head or loss of consciousness.  Recalls the entire event.       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    Post-menopausal    Subclinical hypothyroidism    Primary osteoarthritis of left hip    Chronic pain of right knee    Chronic pruritus    Bilateral impacted cerumen    Dysphagia    Functional urinary incontinence       Past Medical History:  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    surgery Dec 31 2021    Wears glasses        Past Surgical History:   Procedure Laterality Date    CHOLECYSTECTOMY  1980    HIP SURGERY      HYSTERECTOMY,  VAGINAL  2013    IR CHOLECYSTOSTOMY PERCUTANEOUS COMPLETE  1980    LAMINECTOMY  2023    LUMBAR DISCECTOMY  1998    LUMBAR SPINE SURGERY Right 12/31/2021    Re-exploration Right L5-S1 with lateral recess decompression & diskectomy - Dr. 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    HIP TOTAL ARTHROPLASTY ANTERIOR APPROACH performed by Terrilyn Pama GAILS, MD at [facility] MAIN OR       Social History     Socioeconomic History    Marital status: Married     Spouse name: Not on file    Number of children: Not on file    Years of education: ADN    Highest education level: Not on file   Occupational History    Not on file   Tobacco Use    Smoking status: Former     Average packs/day: 0.2 packs/day for 43.1  years (8.6 ttl pk-yrs)     Types: Cigarettes     Start date: 09/02/1979    Smokeless tobacco: Never    Tobacco comments:     average 2 to 4 cigarettes daily   Vaping Use    Vaping status: Never Used   Substance and Sexual Activity    Alcohol use: Yes     Alcohol/week: 8.0 standard drinks of alcohol     Types: 8 Glasses of wine per week     Comment: 1-3 a day    Drug use: Never    Sexual activity: Yes     Partners: Male     Birth control/protection: Surgical     Comment: 53 years   Other Topics Concern    Not on file   Social History Narrative    y day.  Her husband does smoke cigars.  She does not use illicit drugs.  She does drink wine 1-2 glasses at night.    She enjoys her time with grand children, reading.    Wears a seat belt in a car.          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 Married to [employer].  4 children together:  - Sean 1970   -Adam 1972 Grand child- 1 boy  -Carrie 1975 Grand child -1 girl  -Almarie (Dayla) [phone] - 3 girls    She does currently smoke cigarettes - 1/2 a pack a week, not ever     Social Drivers of Psychologist, prison and probation services Strain: Low Risk  (05/25/2023)    Overall Financial Resource Strain (CARDIA)      Difficulty of Paying Living Expenses: Not hard at all   Food Insecurity: No Food Insecurity (05/29/2024)    Hunger Vital Sign     Worried About Running Out of Food in the Last Year: Never true     Ran Out of Food in the Last Year: Never true   Transportation Needs: No Transportation Needs (05/29/2024)    PRAPARE - Therapist, art (Medical): No     Lack of Transportation (Non-Medical): No   Physical Activity: Sufficiently Active (05/29/2024)    Exercise Vital Sign     Days of Exercise per Week: 5 days     Minutes of Exercise per Session: 30 min   Stress: Not on file   Social Connections: Not on file   Intimate Partner Violence: Not on file   Housing Stability: Low Risk  (05/29/2024)    Housing Stability Vital Sign     Unable to Pay for Housing in the Last Year: No     Number of Times Moved in the Last Year: 0     Homeless in the Last Year: No       Family History:  Family History   Problem Relation Age of Onset    Other Sister         prediabetic    Obesity Sister     Colon Cancer Sister 30        colostomy    Cancer Sister         colon    High Blood Pressure Brother     Mastectomy Maternal Grandmother 49        breast cancer    No Known Problems Maternal Grandfather     No Known Problems Paternal Grandmother     Diabetes Paternal Grandfather     Osteoarthritis Brother 13  2 knee replacement    High Blood Pressure Brother     No Known Problems Brother     Hypertension Mother     Diabetes Father     Uterine Cancer Neg Hx     Ovarian Cancer Neg Hx        Medications:  Patient medication list reviewed  This patient does not have an active medication from one of the medication groupers.    Allergies:    Codeine and Lisinopril    Review of Systems      Vitals:    09/06/24 1402   BP: (!) 153/83   Pulse: 68   Resp: 18   Temp: 98.1 F (36.7 C)   TempSrc: Oral   SpO2: 95%     Physical Exam  General: Well appearing; no acute distress   HEENT:  Normocephalic atraumatic  Neck: Supple, free range of  motion   Respiratory: CTA all lung fields, no WRR, normal effort   Cardiac:  Normal S1-S2 no murmurs rubs or gallops, RRR   Chest: Focal ttp and ecchymosis left lateral rib area 8-10. No crepitus. No deformities, normal symmetric rise  Abd: nttp, nondistended, no rigidity  Skin: No rashes  Neuro: A&O x3    Medical Decision Making:  72yof mechanical fall sustained rib fractures 9-10, no ptx. No chest wall deformity.  No flail chest.  No intractable pain.  No tachypnea.  Fractures are evident minimally displaced.  There is no risk of respiratory compromise including hypoxia or decompensation appreciated at this time.  Differentials included musculoskeletal contusion, rib fracture, PTX, muscle strain.  Patient overall appears well.  Discussed at length strict return precautions.  Discussed at length home care.  Patient notes good understanding.  All questions answered. Return 1 week for repeat xray in setting of small effusion.     ED Course:       Procedures    Vital Signs for this visit:  No data found.    Lab findings during this visit (only abnormal values will be noted, if no value noted then the result was normal range):  Labs Reviewed - No data to display    Radiology studies during this visit and the past 24 hours:  No results found.    Medications given in the ED:  Medications - No data to display    Diagnosis:  Final diagnoses:   Closed fracture of multiple ribs of left side, initial encounter   Pleural effusion, left       Discharge prescriptions and/or changes if applicable:       Medication List        ASK your doctor about these medications      acetaminophen  500 MG tablet  Commonly known as: TYLENOL      amLODIPine  10 MG tablet  Commonly known as: NORVASC   Take 1 tablet by mouth every evening     aspirin  81 MG EC tablet  Take 1 tablet by mouth daily morning     atorvastatin  40 MG tablet  Commonly known as: LIPITOR  TAKE 1 TABLET BY MOUTH EVERY DAY     azithromycin  250 MG tablet  Commonly known as:  Zithromax  Z-Pak  500 mg today then 250 mg days 2-5     Cholecalciferol 50 MCG (2000 UT) Tabs     Fish Oil 1200 MG Caps     glucosamine-chondroitin 500-400 MG Caps     ibuprofen 200 MG tablet  Commonly known as: ADVIL;MOTRIN  levothyroxine  25 MCG tablet  Commonly known as: SYNTHROID   TAKE 1 TABLET BY MOUTH EVERY DAY     metoprolol  succinate 25 MG extended release tablet  Commonly known as: TOPROL  XL  TAKE 1 TABLET BY MOUTH EVERY DAY FOR BLOOD PRESSURE     MULTIVITAMIN ADULT PO              Follow-up if applicable:  No follow-up provider specified.    Please note that portions of this document were created using the M*Modal Fluency Direct dictation system.  Any inconsistencies or typographical errors may be the result of mis-transcription that persist in spite of proof-reading and should be addressed with the document creator.        Eilleen Curtis SQU[place], [place]  09/07/24 [phone]

## 2024-09-06 NOTE — Discharge Instructions (Signed)
 On x-ray you appear to have fractured ribs 9 and 10 on the left side.  A radiologist will also read the x-rays.  If there are any new findings, we will call you otherwise you should be able to view the results on MyChart.    Most rib fractures heal within six weeks.    You should be able to resume daily activities much sooner. However, be warned that pain from rib fractures can be severe for several days following the injury.    Stay well hydrated.    You can alternate Tylenol  650 mg every 6 hours as needed for pain and Motrin 600 mg every 6 hours as needed for pain.    You can also use topical pain relief such as Voltaren or Salonpas (creams or patches) and there are other.     I would use a heat pad at this point to help with pain relief.  Remember to never put a heating pad over topicals as it can burn your skin.    Splint for coughing/sneezing with hand or pillow for comfort.    Pneumonia is one of the most common complications of rib fractures, typically due to splinting (not allowing oneself to take deep breaths/breathing shallow due to pain) and atelectasis. Atelectasis happens when lung sacs (alveoli) can't inflate properly, which means blood, tissues and organs may not get oxygen. Thus, it is very important that you take deep breaths at least 10 times an hour while awake: incentive spirometry will help with this.    Go to EMERGENCY DEPARTMENT or CALL 911 for severe symptoms: severe pain, dizziness, shortness of breath, coughing up blood, or abdominal pain. should prompt seeking immediate new or severe pain, worsening cough, generalized weakness, pallor, or fever.

## 2024-09-06 NOTE — ED Triage Notes (Signed)
 Patient reports having left side pain since Monday, states the pain is in her ribs as well and feels like it is bruised, unsure if she injured herself, been taking Tylenol  and Ibuprofen.

## 2024-09-06 NOTE — Other (Signed)
 Advised patient at time of visit of fractures of left 9th and 10th ribs.  Strict at home care provided and return precautions.

## 2024-09-08 MED ORDER — ATORVASTATIN CALCIUM 40 MG PO TABS
40 | ORAL_TABLET | Freq: Every day | ORAL | 3 refills | Status: AC
Start: 2024-09-08 — End: ?

## 2024-10-10 LAB — COLOGUARD TEST, EXTERNAL: Cologuard Test, External: NEGATIVE

## 2024-10-18 LAB — FECAL DNA COLORECTAL CANCER SCREENING (COLOGUARD): FIT-DNA (Cologuard): NEGATIVE

## 2024-12-04 ENCOUNTER — Ambulatory Visit: Admit: 2024-12-04 | Discharge: 2024-12-04 | Payer: Medicare (Managed Care) | Attending: Family | Primary: Family

## 2024-12-04 VITALS — Ht 61.5 in

## 2024-12-04 DIAGNOSIS — M1711 Unilateral primary osteoarthritis, right knee: Principal | ICD-10-CM

## 2024-12-04 MED ORDER — METHYLPREDNISOLONE ACETATE 80 MG/ML IJ SUSP
80 | Freq: Once | INTRAMUSCULAR | Status: AC
Start: 2024-12-04 — End: 2024-12-04
  Administered 2024-12-04: 17:00:00 80 mg via INTRA_ARTICULAR

## 2024-12-04 NOTE — Progress Notes (Signed)
"  CC: Cortisone Injection right knee    HPI:  The patient is a 73 y.o. female who presents today for cortisone injection into the right knee. Pain today is reported at 5/10.    Examination:  Examination of bilateral lower extremities reveals neurovascularly intact limbs. Skin is cool, dry and intact. There is no knee effusion    Assessment: right knee primary osteoarthritis     Plan:Cortisone injection  After informed consent was obtained and written consent signed, under sterile conditions, 80 mg of Depo-Medrol  and 4  mL of 1% Xylocaine  was injected into the patient's right knee intra-articularly. The patient tolerated the procedure quite well. Typical post-injection instructions were given.   "

## 2024-12-04 NOTE — Progress Notes (Signed)
"  Procedure Time       Pre-procedure checklist:      [x]   Patient identity confirmed using (2) patient identifiers  [x]   Documenation in chart agrees with intended procedure  [x]   Consent is signed and agrees with chart and patient  [x]   Relevant images and test results are labeled and correctly displayed  []   Any required special equipment, blood products, devices, and implants for this           procedure are available and accurately matched to the patient  [x]   Site is marked as required per policy    Confirmation that patient has taken prescribed pre-procedure medications:    []   Ciprofloxacin 500 mg oral taken prior to the procedure today  []   Levaquin 500 mg oral taken prior to procedure   []   Lorazepam      mg oral taken prior othe procedure today. Confirm paitent has a driver       with him/her today   []   Macrobid 250 mg oral taken prior to the procedure today  []   Other ( list exact name and dose) oral taken prior to procedure day:  [x]   No premedications ordered    Timeout completed by the provider and assistant immediatily prior to the procedure:    [x]   Patient identity confirmed using (2) patient identifiers  [x]   Documentation in chart agrees with intended proceudre  [x]   Consent is signed and agrees with chart and patient   [x]   Correct site and side has been marked by provider  [x]   Relevant images and test results are labeled and correctly displayed  []   Any required special equipment, blood products, devices, and implants for this                 procedure are available and accurately matched to the patient  []   Confirm the need to administer antibiotics or fluids for irrigation purposes  [x]   Any safety practices needed based on patient history  RT knee:Depo-Medrol  80 mg:Lot#[id number],Exp:01/28/2026,NDC#0009-3475-01  Lidocaine -MPF 1%:Lot#[id number],Exp:03/28/2028,NDC#63323-492-09  "
